# Patient Record
Sex: Male | Born: 1935
Health system: Southern US, Community
[De-identification: ages and names within clinical notes are randomized; demographics above are authoritative.]

## PROBLEM LIST (undated history)

## (undated) DIAGNOSIS — D649 Anemia, unspecified: Secondary | ICD-10-CM

## (undated) DIAGNOSIS — E782 Mixed hyperlipidemia: Secondary | ICD-10-CM

## (undated) DIAGNOSIS — M1990 Unspecified osteoarthritis, unspecified site: Secondary | ICD-10-CM

## (undated) DIAGNOSIS — I209 Angina pectoris, unspecified: Secondary | ICD-10-CM

## (undated) DIAGNOSIS — IMO0002 Reserved for concepts with insufficient information to code with codable children: Secondary | ICD-10-CM

## (undated) DIAGNOSIS — C61 Malignant neoplasm of prostate: Secondary | ICD-10-CM

## (undated) DIAGNOSIS — I509 Heart failure, unspecified: Secondary | ICD-10-CM

## (undated) DIAGNOSIS — Z8711 Personal history of peptic ulcer disease: Secondary | ICD-10-CM

## (undated) DIAGNOSIS — H8309 Labyrinthitis, unspecified ear: Secondary | ICD-10-CM

## (undated) DIAGNOSIS — R0609 Other forms of dyspnea: Secondary | ICD-10-CM

## (undated) DIAGNOSIS — R0600 Dyspnea, unspecified: Secondary | ICD-10-CM

## (undated) DIAGNOSIS — E119 Type 2 diabetes mellitus without complications: Secondary | ICD-10-CM

## (undated) DIAGNOSIS — E1121 Type 2 diabetes mellitus with diabetic nephropathy: Secondary | ICD-10-CM

## (undated) DIAGNOSIS — K5730 Diverticulosis of large intestine without perforation or abscess without bleeding: Secondary | ICD-10-CM

## (undated) DIAGNOSIS — I10 Essential (primary) hypertension: Secondary | ICD-10-CM

## (undated) DIAGNOSIS — E7800 Pure hypercholesterolemia, unspecified: Secondary | ICD-10-CM

## (undated) DIAGNOSIS — I5030 Unspecified diastolic (congestive) heart failure: Secondary | ICD-10-CM

## (undated) DIAGNOSIS — Z87442 Personal history of urinary calculi: Secondary | ICD-10-CM

## (undated) DIAGNOSIS — I2510 Atherosclerotic heart disease of native coronary artery without angina pectoris: Secondary | ICD-10-CM

## (undated) DIAGNOSIS — J40 Bronchitis, not specified as acute or chronic: Secondary | ICD-10-CM

## (undated) DIAGNOSIS — N200 Calculus of kidney: Secondary | ICD-10-CM

## (undated) DIAGNOSIS — K219 Gastro-esophageal reflux disease without esophagitis: Secondary | ICD-10-CM

## (undated) DIAGNOSIS — R002 Palpitations: Secondary | ICD-10-CM

## (undated) DIAGNOSIS — G5710 Meralgia paresthetica, unspecified lower limb: Secondary | ICD-10-CM

## (undated) DIAGNOSIS — M1710 Unilateral primary osteoarthritis, unspecified knee: Secondary | ICD-10-CM

## (undated) DIAGNOSIS — R600 Localized edema: Secondary | ICD-10-CM

## (undated) HISTORY — DX: Diverticulosis of large intestine without perforation or abscess without bleeding: K57.30

## (undated) HISTORY — PX: DIAGNOSTIC LAPAROSCOPY: SUR761

## (undated) HISTORY — DX: Type 2 diabetes mellitus with diabetic nephropathy: E11.21

## (undated) HISTORY — PX: BACK SURGERY: SHX140

## (undated) HISTORY — DX: Localized edema: R60.0

## (undated) HISTORY — DX: Reserved for concepts with insufficient information to code with codable children: IMO0002

## (undated) HISTORY — PX: CATARACT EXTRACTION W/ INTRAOCULAR LENS IMPLANT & ANTERIOR VITRECTOMY, BILATERAL: SHX1310

## (undated) HISTORY — PX: CARDIAC CATHETERIZATION: SHX172

## (undated) HISTORY — DX: Mixed hyperlipidemia: E78.2

## (undated) HISTORY — PX: EYE SURGERY: SHX253

## (undated) HISTORY — PX: JOINT REPLACEMENT: SHX530

## (undated) HISTORY — DX: Atherosclerotic heart disease of native coronary artery without angina pectoris: I25.10

## (undated) HISTORY — DX: Unilateral primary osteoarthritis, unspecified knee: M17.10

## (undated) HISTORY — PX: TOTAL SHOULDER REPLACEMENT: SUR1217

## (undated) HISTORY — PX: LASIK: SHX215

## (undated) HISTORY — DX: Unspecified diastolic (congestive) heart failure: I50.30

## (undated) HISTORY — DX: Labyrinthitis, unspecified ear: H83.09

## (undated) HISTORY — PX: OTHER SURGICAL HISTORY: SHX169

## (undated) HISTORY — PX: COLONOSCOPY: SHX174

## (undated) HISTORY — DX: Personal history of peptic ulcer disease: Z87.11

## (undated) HISTORY — DX: Palpitations: R00.2

## (undated) HISTORY — DX: Meralgia paresthetica, unspecified lower limb: G57.10

---

## 2000-09-26 ENCOUNTER — Encounter: Admission: RE | Admit: 2000-09-26 | Discharge: 2000-09-26 | Payer: Self-pay | Admitting: Gastroenterology

## 2000-09-26 ENCOUNTER — Encounter: Payer: Self-pay | Admitting: Gastroenterology

## 2003-10-15 ENCOUNTER — Encounter: Admission: RE | Admit: 2003-10-15 | Discharge: 2003-10-15 | Payer: Self-pay | Admitting: Internal Medicine

## 2005-08-06 ENCOUNTER — Encounter: Admission: RE | Admit: 2005-08-06 | Discharge: 2005-08-06 | Payer: Self-pay | Admitting: Gastroenterology

## 2006-07-17 ENCOUNTER — Encounter: Admission: RE | Admit: 2006-07-17 | Discharge: 2006-07-17 | Payer: Self-pay | Admitting: Gastroenterology

## 2007-01-06 ENCOUNTER — Ambulatory Visit (HOSPITAL_COMMUNITY): Admission: RE | Admit: 2007-01-06 | Discharge: 2007-01-06 | Payer: Self-pay | Admitting: Urology

## 2007-01-08 ENCOUNTER — Ambulatory Visit: Admission: RE | Admit: 2007-01-08 | Discharge: 2007-02-11 | Payer: Self-pay | Admitting: Radiation Oncology

## 2007-03-12 ENCOUNTER — Encounter (INDEPENDENT_AMBULATORY_CARE_PROVIDER_SITE_OTHER): Payer: Self-pay | Admitting: Urology

## 2007-03-12 ENCOUNTER — Inpatient Hospital Stay (HOSPITAL_COMMUNITY): Admission: RE | Admit: 2007-03-12 | Discharge: 2007-03-13 | Payer: Self-pay | Admitting: Urology

## 2007-07-10 HISTORY — PX: PROSTATECTOMY: SHX69

## 2007-07-12 ENCOUNTER — Emergency Department (HOSPITAL_COMMUNITY): Admission: EM | Admit: 2007-07-12 | Discharge: 2007-07-12 | Payer: Self-pay | Admitting: Emergency Medicine

## 2009-02-06 HISTORY — PX: CORONARY ANGIOPLASTY WITH STENT PLACEMENT: SHX49

## 2009-02-08 ENCOUNTER — Ambulatory Visit (HOSPITAL_COMMUNITY): Admission: RE | Admit: 2009-02-08 | Discharge: 2009-02-08 | Payer: Self-pay | Admitting: Interventional Cardiology

## 2009-02-18 ENCOUNTER — Inpatient Hospital Stay (HOSPITAL_BASED_OUTPATIENT_CLINIC_OR_DEPARTMENT_OTHER): Admission: RE | Admit: 2009-02-18 | Discharge: 2009-02-18 | Payer: Self-pay | Admitting: Interventional Cardiology

## 2009-02-24 ENCOUNTER — Inpatient Hospital Stay (HOSPITAL_COMMUNITY): Admission: AD | Admit: 2009-02-24 | Discharge: 2009-02-25 | Payer: Self-pay | Admitting: Interventional Cardiology

## 2010-10-14 LAB — CBC
Hemoglobin: 14.4 g/dL (ref 13.0–17.0)
RBC: 4.4 MIL/uL (ref 4.22–5.81)

## 2010-10-14 LAB — BASIC METABOLIC PANEL
CO2: 28 mEq/L (ref 19–32)
Glucose, Bld: 127 mg/dL — ABNORMAL HIGH (ref 70–99)
Potassium: 4.1 mEq/L (ref 3.5–5.1)
Sodium: 140 mEq/L (ref 135–145)

## 2010-10-15 LAB — POCT I-STAT 3, VENOUS BLOOD GAS (G3P V)
Acid-base deficit: 1 mmol/L (ref 0.0–2.0)
Bicarbonate: 25.3 mEq/L — ABNORMAL HIGH (ref 20.0–24.0)
O2 Saturation: 67 %
TCO2: 27 mmol/L (ref 0–100)
pO2, Ven: 38 mmHg (ref 30.0–45.0)

## 2010-10-15 LAB — POCT I-STAT 3, ART BLOOD GAS (G3+)
O2 Saturation: 95 %
TCO2: 29 mmol/L (ref 0–100)

## 2010-11-21 NOTE — H&P (Signed)
Ryan Hunt, Ryan Hunt               ACCOUNT NO.:  [id number]   MEDICAL RECORD NO.:  [id number]          PATIENT TYPE:  INP   LOCATION:  2                         FACILITY:  [facility]   PHYSICIAN:  Ronald L. Rosana Hoes, M.D.  DATE OF BIRTH:  1931/06/27   DATE OF ADMISSION:  03/12/2007  DATE OF DISCHARGE:                              HISTORY & PHYSICAL   CHIEF COMPLAINT:  I have prostate cancer.   HISTORY OF PRESENT ILLNESS:  Ryan Hunt is a very nice 77 year old  white male with a strong family history of prostate cancer.  He was sent  for evaluation by Dr. Mindi Curling and found to have an elevated PSA of  7.8.  Subsequently, underwent ultrasound biopsy of prostate which  revealed a Gleason score 7 adenocarcinoma in 50% of the biopsy in the  base of the right side of his prostate, 10% from the mid section of the  right side of the prostate.  Metastatic workup was essentially negative.  After understanding risks, benefits, alternatives, elected to proceed  with robotic prostatectomy.   PAST MEDICAL HISTORY:  He has no known allergies.   MEDICATIONS:  He is currently on Flomax which he will stop, Benazepril,  hydrochlorothiazide, omeprazole, Lipitor, enteramine, aspirin which he  stopped, multivitamins and flax oil.   MEDICAL ILLNESSES:  He has hypertension and hypercholesterolemia.  He  does have some voiding symptoms, but otherwise, he is without particular  problems.   SURGERY:  He has had right shoulder surgery, left shoulder surgery and  left knee surgery.   SOCIAL HISTORY:  He smoked half a pack a day for 20 years, but he quit  35 years ago.  He has two glasses of beer per week.   FAMILY HISTORY:  His father has suffered prostate cancer, had prostate  removed.   REVIEW OF SYSTEMS:  He has no shortness of breath, dyspnea on exertion,  chest pain or GI complaints.   PHYSICAL EXAMINATION:  VITAL SIGNS:  Afebrile.  Vital Signs: Stable.  GENERAL:  Well-nourished, well-groomed,  oriented x3.  HEENT:  Nose and throat normal.  NECK:  Without masses or thyromegaly.  CHEST:  Normal diaphragmatic motion.  ABDOMEN:  Soft, nontender without masses, organomegaly or hernias.  EXTREMITIES: Normal.  NEUROLOGICAL:  Intact.  SKIN:  Normal.  GENITALIA:  penis, meatus, scrotum, testicle, adnexa, anus, perineum  normal.  Rectal vault is empty.  Prostate is 35 grams, benign to  palpation.  HEART:  Normal sinus rhythm without murmurs or gallops.   IMPRESSION:  Clinical stage T1C adenocarcinoma of prostate.   PLANS:  Radical retropubic prostatectomy.      [place] Rosana Hoes, M.D.  Electronically Signed     RLD/MEDQ  D:  03/12/2007  T:  03/13/2007  Job:  [id number]

## 2010-11-21 NOTE — Discharge Summary (Signed)
Ryan Hunt, Ryan Hunt NO.:  [id number]   MEDICAL RECORD NO.:  [id number]          PATIENT TYPE:  INP   LOCATION:  2506                         FACILITY:  [facility]   PHYSICIAN:  Belva Crome, M.D.   DATE OF BIRTH:  1930/07/18   DATE OF ADMISSION:  02/24/2009  DATE OF DISCHARGE:  02/25/2009                               DISCHARGE SUMMARY   ADMITTING DIAGNOSIS:  Exertional angina, class III with 90% mid  circumflex stenosis documented by diagnostic cath.   DISCHARGE DIAGNOSES:  1. Coronary artery disease with angina.      a.     Successful drug-eluting stent implantation into the mid       circumflex on February 24, 2009.  2. Hypertension.  3. Hyperlipidemia.  4. Dyspnea on exertion of uncertain etiology.      a.     Abnormal CT scan of the chest demonstrating 4 mm bilateral       visual nodules of uncertain significance.   PROCEDURES PERFORMED:  Percutaneous coronary intervention with drug-  eluting stent XIENCE stent on February 24, 2009.   CONDITION ON DISCHARGE:  Improved.   MEDICATIONS ON DISCHARGE:  1. Lotensin HCT 20/25 mg daily.  2. Coated aspirin 325 mg daily.  3. Flomax 0.4 mg daily.  4. Plavix 75 mg daily.  5. Lipitor 10 mg daily.  6. Nitroglycerin 0.4 mg sublingually p.r.n. chest pain.  7. Omeprazole 20 mg per day.  8. Omega-3 fish oil 1 g twice a day.  9. Multiple vitamin 1 a day.  10.Flaxseed oil, dose unknown.   ACTIVITY:  Gradually increase activity back to usual activity on February 28, 2009.  Return to work on March 09, 2009.  He is cautioned to call  if prolonged chest discomfort.  He is urged to use sublingual  nitroglycerin if chest discomfort greater than 5 minutes.   DIET:  Prudent, low-fat diet.   HISTORY AND PHYSICAL AND CLINICAL COURSE:  The patient was admitted to  the hospital on February 24, 2009, at the same day admission for PCI of  circumflex.  Please refer to the procedure note for details.  He  underwent successful  stenting of the mid circumflex lesion from 90% to  0% with TIMI grade 3 flow.   No complications occurred in recovery for overnight.  He ambulated  without difficulty.  He is discharged home on his same medical regimen.  He has follow up set to see Dr. Daneen Schick on March 02, 2009.      Belva Crome, M.D.  Electronically Signed     HWS/MEDQ  D:  02/25/2009  T:  02/25/2009  Job:  [id number]

## 2010-11-21 NOTE — Cardiovascular Report (Signed)
NAMEBRETLEY, Ryan Hunt NO.:  [id number]   MEDICAL RECORD NO.:  [id number]          PATIENT TYPE:  OIB   LOCATION:  1963                         FACILITY:  [facility]   PHYSICIAN:  Belva Crome, M.D.   DATE OF BIRTH:  August 27, 1930   DATE OF PROCEDURE:  02/18/2009  DATE OF DISCHARGE:  02/18/2009                            CARDIAC CATHETERIZATION   INDICATIONS FOR PROCEDURE:  Exertional dyspnea, unexplainable.  Prior  catheterization in 1994 demonstrating 70% LAD.  Recent nuclear study  unremarkable for ischemia but with exercise-induced left bundle-branch  block.  The patient has had a CT angiogram performed but has no evidence  of pulmonary emboli.  This study is being done to rule out the  possibility of pulmonary hypertension unidentified by prior testing and  to rule out matched three-vessel ischemia causing the perfusion scan to  be normal.   PROCEDURE PERFORMED:  1. Right heart cath.  2. Left heart cath.  3. Coronary angiography.  4. Left ventriculography by hand injection.   DESCRIPTION:  After informed consent, a 4-French sheath was started in  the right femoral artery and a 7-French sheath in the right femoral  vein, both using the modified Seldinger technique.  Xylocaine 1% was  used for local anesthesia.  Versed 1 mg and 25 mcg of fentanyl was  administered for sedation.   The patient then had a Swan-Ganz catheter advanced through each right  heart chamber and into the pulmonary capillary wedge position.  There  was no evidence of pulmonary hypertension, and the wedge pressure was  relatively normal.  Cardiac outputs were performed using both  thermodilution technique and the Fick method.  Fick cardiac output was  4.L liters per minute.  Thermodilution cardiac output 3.4 L per minute.   Coronary angiography was then performed following left ventriculography  by hand injection using the 4-French multipurpose catheter.  The right  coronary was selectively  engaged and 2 images were obtained.  A 4-French  #4 left Judkins catheter was used for left coronary angiography.  Nitroglycerin 200 mcg was administered into the left coronary via the  Judkins catheter.  The patient tolerated the procedure without  complications.   RESULTS:  1. Hemodynamic data:      a.     Aortic pressure 119/60.      b.     Left ventricular pressure 123/20.      c.     Right atrial mean pressure 7 mmHg.      d.     Right ventricular pressure 33/49 mmHg.      e.     Pulmonary artery pressure 26/70.      f.     Capillary wedge mean pressure 15 mmHg, A-wave 19 mmHg, V-       wave 17 mmHg.      g.     Cardiac output by thermodilution 3.4 L per minute.  Cardiac       output by Fick 4.5 L per minute.  2. Left ventriculography:  The LV cavity size and function are normal.  No mitral regurgitation is noted.  Estimated EF is 65%.  3. Coronary angiography:      a.     Left main:  Widely patent.      b.     Left anterior descending:  Proximal mild-to-moderate       calcification is noted.  The first diagonal pains 85-90% ostial       narrowing.  The diagonal is small to moderate in size.  The second       diagonal is large.  The LAD between the first and second diagonal       contains mild less than 30% narrowing.  The LAD wraps around the       left ventricular apex.      c.     Circumflex artery:  Circumflex coronary artery gives origin       to 3 obtuse marginal branches.  The second obtuse marginal is       large vessel.  The first obtuse marginal is relatively small and       contains ostial 60% narrowing.  The circumflex in the mid segment       near the origin of the first marginal contains a very eccentric       stenosis that is 85% in many of the LAO views and appears to be       less than 50% in RAO views.  The RAO views were misleading because       there is a branch that overlaps the stenosis and gives the       impression that there is a larger lumen that  is actually present.       Distal circumflex is patent and does not have any significant       obstruction.      d.     Right coronary:  This is a dominant vessel.  PDA is noted.       Left ventricular branches are noted.  No significant obstructive       lesions are seen.   CONCLUSION:  1. Significant mid to proximal circumflex.  There is greater than 75%      obstruction noted.  The first diagonal also contains 85%      obstruction.  Left anterior descending contains luminal      irregularities.  2. Normal left ventricular function.  3. Normal pulmonary artery pressures.  4. Upper normal to mildly elevated end-diastolic left ventricular      pressure is raising the possibility of mild diastolic heart      failure.   RECOMMENDATIONS:  Consider PCI on the circumflex lesion which  theoretically could cause disproportionate dyspnea because of papillary  muscle ischemia and secondary mitral regurgitation.  The patient's  dyspnea, however, could be due to diastolic heart failure and physical  decondition.  Pulmonary emboli and significant pulmonary hypertension  has been excluded.      Belva Crome, M.D.  Electronically Signed     HWS/MEDQ  D:  02/18/2009  T:  02/18/2009  Job:  [id number]   cc:   Cletis Athens, M.D.

## 2010-11-21 NOTE — Cardiovascular Report (Signed)
Ryan Hunt, Ryan Hunt NO.:  [id number]   MEDICAL RECORD NO.:  [id number]          PATIENT TYPE:  INP   LOCATION:  2506                         FACILITY:  [facility]   PHYSICIAN:  Belva Crome, M.D.   DATE OF BIRTH:  11-14-1930   DATE OF PROCEDURE:  02/24/2009  DATE OF DISCHARGE:                            CARDIAC CATHETERIZATION   INDICATIONS FOR PROCEDURE:  Class II-III angina with documented 90%  eccentric mid circumflex lesion.   PROCEDURE PERFORMED:  Circumflex drug-eluting stent deployment, Xience V  3.0 x 12 mm.   DESCRIPTION:  The patient was brought to the Cath Lab.  He had been  pretreated with Plavix.  He received 300 of Plavix on the morning of the  procedure.  He was given a bolus of Angiomax 0.75 mg/kg followed by 1.75  mg/kg per hour infusion.  ACT was documented to be greater than 450.  Pre-dilatation was performed with a 2.5 x 12 mm long Voyager balloon.  A  3.0 x 12 mm long Xience V drug-eluting stent was then deployed and  postdilated with an 8-mm long x 3.0-mm diameter Voyager [place] to 14  atmospheres.  Good angiographic result was obtained.  The region just  distal to the stent contains residual 30-40% narrowing, but no  significant change or high-grade obstruction was felt to be present.  TIMI grade 3 flow was noted.   The equipment used other than as stated above with balloons and stents  includes a CLS 3.5 6-French guide catheter, and a BMW guidewire.  The  patient received 2 mg of Versed and 50 mcg of fentanyl intravenously.  A  700 mcg of intracoronary nitroglycerin was administered during the  procedure.   CONCLUSION:  Successful stenting of the mid circumflex from 90% to 0%  with TIMI grade 3 flow.   PLAN:  Clinical followup.  Aspirin and Plavix for at least a year.  Discharge on February 25, 2009.      Belva Crome, M.D.  Electronically Signed     HWS/MEDQ  D:  02/24/2009  T:  02/24/2009  Job:  [id number]   cc:   Cletis Athens,  M.D.

## 2010-11-21 NOTE — Op Note (Signed)
Ryan Hunt, Ryan Hunt               ACCOUNT NO.:  [id number]   MEDICAL RECORD NO.:  [id number]          PATIENT TYPE:  INP   LOCATION:  80                         FACILITY:  [facility]   PHYSICIAN:  Ronald L. Rosana Hoes, M.D.  DATE OF BIRTH:  21-Aug-1930   DATE OF PROCEDURE:  03/12/2007  DATE OF DISCHARGE:                               OPERATIVE REPORT   DIAGNOSIS:  Adenocarcinoma of the prostate.   OPERATIVE PROCEDURE:  Robotic radical prostatectomy and bilateral pelvic  lymphadenectomy.   SURGEON:  Duane Lope. Rosana Hoes, M.D.   ASSISTANT:  Raynelle Bring, M.D.   ANESTHESIA:  General endotracheal.   ESTIMATED BLOOD LOSS:  150 mL.   TUBES:  20-French Coude Foley catheter and a large round Blake drain.   COMPLICATIONS:  None.   INDICATIONS FOR PROCEDURE:  Ryan Hunt is a very nice 77 year old white  male with a strong family history of prostate cancer who presented with  an elevated PSA of 7.8.  He subsequently underwent ultrasound and biopsy  of the prostate which revealed a Gleason score 7 which was 3+4  adenocarcinoma with 50% of the tissue from the right base of the  prostate and 10% from the right mid prostate.  His metastatic workup was  essentially negative for metastatic disease and after understanding  risks, benefits and alternatives, he elected to proceed with robotic  radical prostatectomy.   PROCEDURE IN DETAIL:  The patient was placed in a supine position and  after appropriate general endotracheal anesthesia, was placed in the  exaggerated lithotomy position, prepped and draped with Betadine in a  sterile fashion.  A 24-French 30 mL balloon catheter was placed in the  bladder and the bladder was drained clear.  A periumbilical port was  placed under direct vision with a 12 mm cannula for the camera site and  after the abdomen had been examined and there were no significant  abnormalities noted, ports were put in under direct vision, a 12 mm  right lateral port, two 8 mm  robotic ports right and left lateral ports,  third arm port left lateral to the left working arm port, and a 5 mm  right superior port.  The proper connections were made to the robot and  dissection was begun.  The bladder was insufflated with approximately  200 mL of sterile saline and the anterior bladder flap was taken down.  The space of Retzius was entered.  The endopelvic fascia was perforated  bilaterally.  Puboprostatic ligaments were partially excised. The tissue  was taken down to the dorsal vein complex.  The dorsal vein complex was  then clamped and cut with an Endo-GIA stapler mobilizing the prostate.  The bladder neck was then dissected off of the prostate anteriorly and  posteriorly down to the area of the seminal vesicles and the ampulla vas  deferens.  These were taken down.  The ampulla vas deferens were sharply  cauterized and cut and the seminal vesicles were taken down bilaterally.  The patient already has erectile dysfunction and it was felt that an  intense nerve spare was not totally indicated  and this had been  discussed with him preoperatively. The right and left bundles were taken  in packets and clipped with Hem-A-Lock clips and dissection was carried  down to the distal prostate after Denonvilliers' fascia had been  dissected off of the posterior prostate.  The apex was then approached  and was sharply dissected down to the urethra.  The urethra was incised  anteriorly and posteriorly and the prostate was disarticulated and  placed in the right lower quadrant.  Bilateral pelvic lymphadenectomy  was then performed under direct vision. Both the external iliac  obturator and hypogastric lymph nodes were taken down. The obturator  nerve was identified and protected.  The lateral lymph nodes were  avoided and each of these specimens were clipped anteriorly and  posteriorly and thus submitted as right and left side and they were  removed through the 12 mm working  port.  Reinspection revealed good  hemostasis and the anastomosis was then approached.  An amp of indigo  carmine was given IV and the orifices were well back.  There was a good  bladder neck. Tissue was sewn with a 2-0 Vicryl suture on the margin on  Denonvilliers' fascia to the periurethral tissue posteriorly and a  holding stitch was placed with a 2-0 Vicryl suture in the 6 o'clock  position to the bladder neck and to the urethral stump.  A running  anastomosis was then performed with 3-0 Monocryl suture utilizing dyed  and undyed suture tied together posteriorly and these were tied  anteriorly. A 20 French Coude catheter was easily passed in the bladder  and the bladder was noted to irrigate clear.  Under direct vision, the  prostatic specimen was grasped with an EndoCatch bag and the right 12 mm  port was closed under direct vision with a 2-0 Vicryl on a suture  passer.  Each of the ports were visualized and removed.  There was good  hemostasis present after a large round Blake drain had been placed in  the pelvis and the specimen was removed through the periumbilical  incision and the fascia was closed with a running 2-0 Vicryl suture.  Good hemostasis was noted to be present.  Each incision was injected  with 0.25% Marcaine and closed with skin staples.  The wounds were  dressed sterilely.  The patient tolerated the procedure well and the  specimen was submitted to pathology and he was taken to the recovery  room stable.      [place] Rosana Hoes, M.D.  Electronically Signed     RLD/MEDQ  D:  03/12/2007  T:  03/12/2007  Job:  [id number]

## 2011-03-29 LAB — URINALYSIS, ROUTINE W REFLEX MICROSCOPIC
Glucose, UA: NEGATIVE
Ketones, ur: 15 — AB
Leukocytes, UA: NEGATIVE
Protein, ur: 100 — AB

## 2011-04-20 LAB — URINALYSIS, ROUTINE W REFLEX MICROSCOPIC
Bilirubin Urine: NEGATIVE
Glucose, UA: NEGATIVE
Hgb urine dipstick: NEGATIVE
Ketones, ur: NEGATIVE
Protein, ur: NEGATIVE

## 2011-04-20 LAB — COMPREHENSIVE METABOLIC PANEL
Albumin: 3.8
BUN: 15
Creatinine, Ser: 1.06
Glucose, Bld: 99
Total Bilirubin: 0.8
Total Protein: 7

## 2011-04-20 LAB — CBC
HCT: 40.1
HCT: 44.6
MCV: 92.1
MCV: 91.4
Platelets: 125 — ABNORMAL LOW
Platelets: 165
RBC: 4.35
RDW: 14.1 — ABNORMAL HIGH
WBC: 8.8

## 2011-04-20 LAB — BASIC METABOLIC PANEL
BUN: 8
Chloride: 103
Potassium: 4

## 2011-04-20 LAB — DIFFERENTIAL
Eosinophils Absolute: 0
Eosinophils Relative: 0
Lymphs Abs: 0.8
Monocytes Absolute: 0.8 — ABNORMAL HIGH
Monocytes Relative: 9

## 2011-04-20 LAB — PROTIME-INR
INR: 1.1
Prothrombin Time: 14.2

## 2011-04-20 LAB — HEMOGLOBIN AND HEMATOCRIT, BLOOD: HCT: 39.7

## 2011-04-20 LAB — APTT: aPTT: 28

## 2011-11-07 DIAGNOSIS — J40 Bronchitis, not specified as acute or chronic: Secondary | ICD-10-CM

## 2011-11-07 HISTORY — DX: Bronchitis, not specified as acute or chronic: J40

## 2011-11-12 ENCOUNTER — Encounter (HOSPITAL_COMMUNITY): Payer: Self-pay | Admitting: Respiratory Therapy

## 2011-11-20 ENCOUNTER — Inpatient Hospital Stay (HOSPITAL_COMMUNITY): Admission: RE | Admit: 2011-11-20 | Payer: Self-pay | Source: Ambulatory Visit

## 2011-12-17 ENCOUNTER — Other Ambulatory Visit: Payer: Self-pay | Admitting: Physician Assistant

## 2011-12-17 ENCOUNTER — Inpatient Hospital Stay (HOSPITAL_COMMUNITY): Admission: RE | Admit: 2011-12-17 | Payer: Medicare Other | Source: Ambulatory Visit

## 2011-12-17 ENCOUNTER — Encounter (HOSPITAL_COMMUNITY): Payer: Self-pay

## 2011-12-17 ENCOUNTER — Encounter: Payer: Self-pay | Admitting: Physician Assistant

## 2011-12-17 DIAGNOSIS — E7800 Pure hypercholesterolemia, unspecified: Secondary | ICD-10-CM | POA: Insufficient documentation

## 2011-12-17 DIAGNOSIS — I10 Essential (primary) hypertension: Secondary | ICD-10-CM | POA: Insufficient documentation

## 2011-12-17 DIAGNOSIS — R0602 Shortness of breath: Secondary | ICD-10-CM

## 2011-12-17 DIAGNOSIS — M1990 Unspecified osteoarthritis, unspecified site: Secondary | ICD-10-CM | POA: Insufficient documentation

## 2011-12-17 DIAGNOSIS — E1169 Type 2 diabetes mellitus with other specified complication: Secondary | ICD-10-CM | POA: Insufficient documentation

## 2011-12-17 DIAGNOSIS — J40 Bronchitis, not specified as acute or chronic: Secondary | ICD-10-CM

## 2011-12-17 DIAGNOSIS — E669 Obesity, unspecified: Secondary | ICD-10-CM | POA: Insufficient documentation

## 2011-12-17 DIAGNOSIS — M1712 Unilateral primary osteoarthritis, left knee: Secondary | ICD-10-CM

## 2011-12-17 NOTE — Pre-Procedure Instructions (Signed)
[place]  12/17/2011   Your procedure is scheduled on:  Monday December 31, 2011.  Report to [facility] at [place] AM.  Call this number if you have problems the morning of surgery: [phone]   Remember:   Do not eat food:After Midnight.  May have clear liquids: up to 4 Hours before arrival until 0520 am.  Clear liquids include soda, tea, black coffee, apple or grape juice, broth.  Take these medicines the morning of surgery with A SIP OF WATER: Amlodipine (Norvasc), and Omeprazole (Prilosec)   Do not wear jewelry  Do not wear lotions or colognes.    Men may shave face and neck.  Do not bring valuables to the hospital.  Contacts, dentures or bridgework may not be worn into surgery.  Leave suitcase in the car. After surgery it may be brought to your room.  For patients admitted to the hospital, checkout time is 11:00 AM the day of discharge.   Patients discharged the day of surgery will not be allowed to drive home.  Name and phone number of your driver:  Special Instructions: CHG Shower Use Special Wash: 1/2 bottle night before surgery and 1/2 bottle morning of surgery.   Please read over the following fact sheets that you were given: Pain Booklet, Coughing and Deep Breathing, Blood Transfusion Information, Total Joint Packet, MRSA Information and Surgical Site Infection Prevention

## 2011-12-17 NOTE — H&P (Signed)
Ryan Hunt is an 78 y.o. male.   Chief Complaint: left knee HPI: Ryan Hunt is a 78 year old seen for follow-up from significant pain in both knees left worse than right. We saw him on 04/24/11 and injected both knees with the third Supartz injection but this helped minimally. The excruciating pain is getting progressively worse, limiting activities of daily living, poses a significant fall risk, and has failed multiple conservative treatments including intraarticular cortisone injections, intraarticular Supartz, bracing, medication and home physical therapy.  Past Medical History  Diagnosis Date  . Hypertension   . Diabetes mellitus   . Bronchitis     hx of  . Shortness of breath     With ambulation  . GERD (gastroesophageal reflux disease)   . Arthritis   . Kidney stone     hx of  . Hypercholesterolemia     Past Surgical History  Procedure Date  . Joint replacement     Bilateral arthoscopic shoulders  . Prostate surgery     Removal  . Cardiac catheterization   . Coronary angioplasty     Stent X 1 in 2010  . Eye surgery     Bilateral lens implant approx 15 years ago  . Colonoscopy     No family history on file. Social History:  reports that he quit smoking about 40 years ago. He does not have any smokeless tobacco history on file. He reports that he drinks alcohol. He reports that he does not use illicit drugs.  Allergies:  Allergies  Allergen Reactions  . Amoxicillin Hives   Current Outpatient Prescriptions on File Prior to Visit  Medication Sig Dispense Refill  . amLODipine (NORVASC) 10 MG tablet Take 10 mg by mouth daily.      Marland Kitchen aspirin 325 MG tablet Take 325 mg by mouth daily.      . benazepril-hydrochlorthiazide (LOTENSIN HCT) 20-25 MG per tablet Take 1 tablet by mouth daily.      Marland Kitchen CINNAMON PO Take 1 tablet by mouth daily.      . fish oil-omega-3 fatty acids 1000 MG capsule Take 2 g by mouth daily.      Marland Kitchen glyBURIDE (DIABETA) 2.5 MG tablet Take 2.5 mg by  mouth 2 (two) times daily with a meal.      . metFORMIN (GLUCOPHAGE) 500 MG tablet Take 1,000 mg by mouth 2 (two) times daily with a meal.      . omeprazole (PRILOSEC) 20 MG capsule Take 20 mg by mouth daily.      . rosuvastatin (CRESTOR) 10 MG tablet Take 10 mg by mouth daily.      . nitroGLYCERIN (NITROSTAT) 0.4 MG SL tablet Place 0.4 mg under the tongue every 5 (five) minutes as needed. For chest pain        (Not in a hospital admission)  No results found for this or any previous visit (from the past 48 hour(s)). No results found.  Review of Systems  Constitutional: Negative.   HENT: Negative.   Eyes: Negative.   Respiratory: Negative.   Cardiovascular: Negative.   Gastrointestinal: Negative.   Genitourinary: Negative.   Musculoskeletal:       Left knee pain  Skin: Negative.   Neurological: Negative.   Endo/Heme/Allergies: Negative.   Psychiatric/Behavioral: Negative.     Blood pressure 162/70, pulse 64, temperature 98.1 F (36.7 C), height 5\' 7"  (1.702 m), weight 102.059 kg (225 lb), SpO2 95.00%. Physical Exam  Constitutional: He is oriented to person, place, and  time. He appears well-developed and well-nourished.  HENT:  Head: Normocephalic and atraumatic.  Mouth/Throat: Oropharynx is clear and moist.  Eyes: EOM are normal. Pupils are equal, round, and reactive to light.  Neck: Normal range of motion. Neck supple.  Cardiovascular: Normal rate, regular rhythm and normal heart sounds.   Respiratory: Effort normal and breath sounds normal.  GI: Soft. Bowel sounds are normal.  Genitourinary:       Not pertinent to current symptomatology therefore not examined.  Musculoskeletal:       Examination of both knees reveals 1 to 2+ crepitation 1+ synovitis range of motion 0-120 degrees both knees are stable with mild diffuse pain mild varus deformity left worse than right. Vascular exam: pulses 2+ and symmetric.  Neurological: He is alert and oriented to person, place, and time.   Skin: Skin is warm and dry.  Psychiatric: He has a normal mood and affect. His behavior is normal. Judgment and thought content normal.    Xrays: AP,LATERAL, FLEXION, and SUNRISE views show significant joint space narrowing, with periarticular osteophytes, and subchondral sclerosis.  Assessment Patient Active Problem List  Diagnoses  . Hypertension  . Bronchitis  . Shortness of breath  . Arthritis  . Hypercholesterolemia  . Left knee DJD  . Diabetes mellitus type 2 in obese   Plan I talk to him about this in detail. I recommend with his significant pain lack of response to conservative care that we schedule a left total knee replacement. Discussed risks benefits and possible complications of the surgery in detail and he understands this completely.   Jhana Giarratano J 12/17/2011, 4:19 PM

## 2011-12-17 NOTE — Progress Notes (Signed)
Pt informed Nurse that a stress test was performed at [facility] in January 2013. Will request results. Pt also reported having a cardiac cath in 2010 with one stent placed, results on chart. Pt denied having a sleep study.

## 2011-12-24 ENCOUNTER — Encounter (HOSPITAL_COMMUNITY)
Admission: RE | Admit: 2011-12-24 | Discharge: 2011-12-24 | Disposition: A | Discharge status: Home / Self Care | Payer: Medicare Other | Source: Ambulatory Visit | Attending: Orthopedic Surgery | Admitting: Orthopedic Surgery

## 2011-12-24 ENCOUNTER — Encounter (HOSPITAL_COMMUNITY): Payer: Self-pay

## 2011-12-24 ENCOUNTER — Ambulatory Visit (HOSPITAL_COMMUNITY)
Admission: RE | Admit: 2011-12-24 | Discharge: 2011-12-24 | Disposition: A | Discharge status: Home / Self Care | Payer: Medicare Other | Source: Ambulatory Visit | Attending: Physician Assistant | Admitting: Physician Assistant

## 2011-12-24 DIAGNOSIS — E119 Type 2 diabetes mellitus without complications: Secondary | ICD-10-CM | POA: Insufficient documentation

## 2011-12-24 DIAGNOSIS — I10 Essential (primary) hypertension: Secondary | ICD-10-CM | POA: Insufficient documentation

## 2011-12-24 DIAGNOSIS — I2510 Atherosclerotic heart disease of native coronary artery without angina pectoris: Secondary | ICD-10-CM | POA: Insufficient documentation

## 2011-12-24 DIAGNOSIS — Z01812 Encounter for preprocedural laboratory examination: Secondary | ICD-10-CM | POA: Insufficient documentation

## 2011-12-24 DIAGNOSIS — Z01818 Encounter for other preprocedural examination: Secondary | ICD-10-CM | POA: Insufficient documentation

## 2011-12-24 HISTORY — DX: Gastro-esophageal reflux disease without esophagitis: K21.9

## 2011-12-24 HISTORY — DX: Bronchitis, not specified as acute or chronic: J40

## 2011-12-24 HISTORY — DX: Pure hypercholesterolemia, unspecified: E78.00

## 2011-12-24 HISTORY — DX: Essential (primary) hypertension: I10

## 2011-12-24 HISTORY — DX: Calculus of kidney: N20.0

## 2011-12-24 HISTORY — DX: Unspecified osteoarthritis, unspecified site: M19.90

## 2011-12-24 LAB — TYPE AND SCREEN
ABO/RH(D): A POS
Antibody Screen: NEGATIVE

## 2011-12-24 LAB — COMPREHENSIVE METABOLIC PANEL
ALT: 58 U/L — ABNORMAL HIGH (ref 0–53)
Calcium: 10.2 mg/dL (ref 8.4–10.5)
GFR calc Af Amer: >90 mL/min (ref >90)
Glucose, Bld: 106 mg/dL — ABNORMAL HIGH (ref 70–99)
Sodium: 142 mEq/L (ref 135–145)
Total Protein: 7.4 g/dL (ref 6.0–8.3)

## 2011-12-24 LAB — URINALYSIS, ROUTINE W REFLEX MICROSCOPIC
Bilirubin Urine: NEGATIVE
Ketones, ur: NEGATIVE mg/dL
Nitrite: NEGATIVE
Protein, ur: 30 mg/dL — AB
Urobilinogen, UA: 1 mg/dL (ref 0.0–1.0)

## 2011-12-24 LAB — DIFFERENTIAL
Eosinophils Relative: 1 % (ref 0–5)
Lymphocytes Relative: 27 % (ref 12–46)
Lymphs Abs: 1.7 10*3/uL (ref 0.7–4.0)
Neutro Abs: 3.9 10*3/uL (ref 1.7–7.7)
Neutrophils Relative %: 62 % (ref 43–77)

## 2011-12-24 LAB — PROTIME-INR
INR: 1.16 (ref 0.00–1.49)
Prothrombin Time: 15 seconds (ref 11.6–15.2)

## 2011-12-24 LAB — URINE MICROSCOPIC-ADD ON

## 2011-12-24 LAB — CBC
Hemoglobin: 15.6 g/dL (ref 13.0–17.0)
MCH: 31.9 pg (ref 26.0–34.0)
MCHC: 35.5 g/dL (ref 30.0–36.0)

## 2011-12-24 MED ORDER — CHLORHEXIDINE GLUCONATE 4 % EX LIQD: 60.0000 mL | Freq: Once | CUTANEOUS | Status: DC

## 2011-12-25 NOTE — Consult Note (Signed)
Anesthesia Chart Review:  Patient is a 78 year old male scheduled for left TKR on 12/31/11.  History includes HTN, HLD, DM2, DOE, GERD, kidney stones, bilateral shoulder surgeries, CAD s/p DES CX 02/24/09, rostatectomy, remote history of smoking.  Records indicate he had an exercise induced left BBB on stress test prior to his cath in August 2010.  His Cardiologist is Dr. Tamala Julian [facility]).  He cleared him for this procedure on 08/02/11 for "upcoming" TKR.  EKG then showed NSR.    His last stress test was on 12/07/09 and showed normal myocardial perfusion, EF 72%.  His last cardiac cath was on 02/18/09 and showed: 1. Significant mid to proximal circumflex. There is greater than 75% obstruction noted. The first diagonal also contains 85% obstruction. Left anterior descending contains luminal  irregularities.  2. Normal left ventricular function.  3. Normal pulmonary artery pressures.  4. Upper normal to mildly elevated end-diastolic left ventricular pressure is raising the possibility of mild diastolic heart failure. Ultimately, he had A DES placed to CX on 02/24/09.  There were no acute cardiopulmonary abnormalities on CXR from 12/24/11.  Labs noted.  AST 50, ALT 58.  PLT 115. Coags WNL.  There are no comparison LFTs currently available.  He does have mild thrombocytopenia but normal PT/PTT.  Will not plan to order any additional labs pre-operatively.  Myra Gianotti, PA-C

## 2011-12-28 NOTE — Progress Notes (Signed)
Patient notified of surgery time change. To be at short stay at 0830am. Reminded to be NPO after midnight. Ryan Hunt

## 2011-12-30 MED ORDER — VANCOMYCIN HCL 1000 MG IV SOLR
1500.0000 mg | INTRAVENOUS | Status: AC
  Administered 2011-12-31: 1500 mg via INTRAVENOUS
  Filled 2011-12-30 (×2): qty 1500

## 2011-12-30 MED ORDER — POVIDONE-IODINE 7.5 % EX SOLN
Freq: Once | CUTANEOUS | Status: DC
  Filled 2011-12-30: qty 118

## 2011-12-30 MED ORDER — LACTATED RINGERS IV SOLN: INTRAVENOUS | Status: DC

## 2011-12-31 ENCOUNTER — Encounter (HOSPITAL_COMMUNITY): Payer: Self-pay | Admitting: Vascular Surgery

## 2011-12-31 ENCOUNTER — Inpatient Hospital Stay (HOSPITAL_COMMUNITY)
Admission: RE | Admit: 2011-12-31 | Discharge: 2012-01-02 | DRG: 470 | Disposition: A | Discharge status: Home Health | Payer: Medicare Other | Source: Ambulatory Visit | Attending: Orthopedic Surgery | Admitting: Orthopedic Surgery

## 2011-12-31 ENCOUNTER — Encounter (HOSPITAL_COMMUNITY)
Admission: RE | Disposition: A | Discharge status: Home Health | Payer: Self-pay | Source: Ambulatory Visit | Attending: Orthopedic Surgery

## 2011-12-31 ENCOUNTER — Ambulatory Visit (HOSPITAL_COMMUNITY): Payer: Medicare Other | Admitting: Vascular Surgery

## 2011-12-31 DIAGNOSIS — M1990 Unspecified osteoarthritis, unspecified site: Secondary | ICD-10-CM | POA: Diagnosis present

## 2011-12-31 DIAGNOSIS — K219 Gastro-esophageal reflux disease without esophagitis: Secondary | ICD-10-CM | POA: Diagnosis present

## 2011-12-31 DIAGNOSIS — Z961 Presence of intraocular lens: Secondary | ICD-10-CM

## 2011-12-31 DIAGNOSIS — M1710 Unilateral primary osteoarthritis, unspecified knee: Principal | ICD-10-CM | POA: Diagnosis present

## 2011-12-31 DIAGNOSIS — E669 Obesity, unspecified: Secondary | ICD-10-CM | POA: Diagnosis present

## 2011-12-31 DIAGNOSIS — R0609 Other forms of dyspnea: Secondary | ICD-10-CM | POA: Diagnosis present

## 2011-12-31 DIAGNOSIS — M1712 Unilateral primary osteoarthritis, left knee: Secondary | ICD-10-CM

## 2011-12-31 DIAGNOSIS — D62 Acute posthemorrhagic anemia: Secondary | ICD-10-CM

## 2011-12-31 DIAGNOSIS — Z79899 Other long term (current) drug therapy: Secondary | ICD-10-CM

## 2011-12-31 DIAGNOSIS — Z7982 Long term (current) use of aspirin: Secondary | ICD-10-CM

## 2011-12-31 DIAGNOSIS — Z96619 Presence of unspecified artificial shoulder joint: Secondary | ICD-10-CM

## 2011-12-31 DIAGNOSIS — E7800 Pure hypercholesterolemia, unspecified: Secondary | ICD-10-CM | POA: Diagnosis present

## 2011-12-31 DIAGNOSIS — I10 Essential (primary) hypertension: Secondary | ICD-10-CM | POA: Diagnosis present

## 2011-12-31 DIAGNOSIS — Z9861 Coronary angioplasty status: Secondary | ICD-10-CM

## 2011-12-31 DIAGNOSIS — E1169 Type 2 diabetes mellitus with other specified complication: Secondary | ICD-10-CM | POA: Diagnosis present

## 2011-12-31 DIAGNOSIS — E119 Type 2 diabetes mellitus without complications: Secondary | ICD-10-CM | POA: Diagnosis present

## 2011-12-31 DIAGNOSIS — Z87891 Personal history of nicotine dependence: Secondary | ICD-10-CM

## 2011-12-31 DIAGNOSIS — Z8546 Personal history of malignant neoplasm of prostate: Secondary | ICD-10-CM

## 2011-12-31 HISTORY — DX: Other forms of dyspnea: R06.09

## 2011-12-31 HISTORY — DX: Type 2 diabetes mellitus without complications: E11.9

## 2011-12-31 HISTORY — PX: TOTAL KNEE ARTHROPLASTY: SHX125

## 2011-12-31 HISTORY — DX: Dyspnea, unspecified: R06.00

## 2011-12-31 HISTORY — DX: Malignant neoplasm of prostate: C61

## 2011-12-31 LAB — GLUCOSE, CAPILLARY: Glucose-Capillary: 150 mg/dL — ABNORMAL HIGH (ref 70–99)

## 2011-12-31 SURGERY — ARTHROPLASTY, KNEE, TOTAL
Anesthesia: General | Site: Knee | Laterality: Left | Wound class: Clean

## 2011-12-31 MED ORDER — CELECOXIB 200 MG PO CAPS
ORAL_CAPSULE | ORAL | Status: AC
  Filled 2011-12-31: qty 1

## 2011-12-31 MED ORDER — SUFENTANIL CITRATE 50 MCG/ML IV SOLN
INTRAVENOUS | Status: DC | PRN
  Administered 2011-12-31: 20 ug via INTRAVENOUS

## 2011-12-31 MED ORDER — TOBRAMYCIN SULFATE 1.2 G IJ SOLR
INTRAMUSCULAR | Status: AC
  Filled 2011-12-31: qty 1.2

## 2011-12-31 MED ORDER — ONDANSETRON HCL 4 MG PO TABS: 4.0000 mg | ORAL_TABLET | Freq: Four times a day (QID) | ORAL | Status: DC | PRN

## 2011-12-31 MED ORDER — PANTOPRAZOLE SODIUM 40 MG PO TBEC
40.0000 mg | DELAYED_RELEASE_TABLET | Freq: Every day | ORAL | Status: DC
  Administered 2011-12-31 – 2012-01-02 (×3): 40 mg via ORAL
  Filled 2011-12-31 (×3): qty 1

## 2011-12-31 MED ORDER — ONDANSETRON HCL 4 MG/2ML IJ SOLN: 4.0000 mg | Freq: Four times a day (QID) | INTRAMUSCULAR | Status: DC | PRN

## 2011-12-31 MED ORDER — CELECOXIB 200 MG PO CAPS
200.0000 mg | ORAL_CAPSULE | Freq: Every day | ORAL | Status: DC
  Administered 2011-12-31 – 2012-01-02 (×3): 200 mg via ORAL
  Filled 2011-12-31 (×2): qty 1

## 2011-12-31 MED ORDER — METOCLOPRAMIDE HCL 10 MG PO TABS: 5.0000 mg | ORAL_TABLET | Freq: Three times a day (TID) | ORAL | Status: DC | PRN

## 2011-12-31 MED ORDER — LIDOCAINE HCL (CARDIAC) 20 MG/ML IV SOLN
INTRAVENOUS | Status: DC | PRN
  Administered 2011-12-31: 100 mg via INTRAVENOUS

## 2011-12-31 MED ORDER — LACTATED RINGERS IV SOLN
INTRAVENOUS | Status: DC | PRN
  Administered 2011-12-31 (×2): via INTRAVENOUS

## 2011-12-31 MED ORDER — ENOXAPARIN SODIUM 30 MG/0.3ML ~~LOC~~ SOLN
30.0000 mg | Freq: Two times a day (BID) | SUBCUTANEOUS | Status: DC
  Administered 2012-01-01 – 2012-01-02 (×3): 30 mg via SUBCUTANEOUS
  Filled 2011-12-31 (×6): qty 0.3

## 2011-12-31 MED ORDER — PHENOL 1.4 % MT LIQD: 1.0000 | OROMUCOSAL | Status: DC | PRN

## 2011-12-31 MED ORDER — INSULIN ASPART 100 UNIT/ML ~~LOC~~ SOLN
0.0000 [IU] | Freq: Three times a day (TID) | SUBCUTANEOUS | Status: DC
  Administered 2011-12-31: 2 [IU] via SUBCUTANEOUS
  Administered 2011-12-31 – 2012-01-02 (×3): 3 [IU] via SUBCUTANEOUS

## 2011-12-31 MED ORDER — FENTANYL CITRATE 0.05 MG/ML IJ SOLN: 50.0000 ug | INTRAMUSCULAR | Status: DC | PRN

## 2011-12-31 MED ORDER — VANCOMYCIN HCL IN DEXTROSE 1-5 GM/200ML-% IV SOLN
1000.0000 mg | Freq: Two times a day (BID) | INTRAVENOUS | Status: AC
  Administered 2011-12-31: 1000 mg via INTRAVENOUS
  Filled 2011-12-31: qty 200

## 2011-12-31 MED ORDER — OXYCODONE HCL 5 MG PO TABS
5.0000 mg | ORAL_TABLET | ORAL | Status: DC | PRN
  Administered 2011-12-31 (×2): 10 mg via ORAL
  Administered 2012-01-01: 5 mg via ORAL
  Administered 2012-01-01: 10 mg via ORAL
  Administered 2012-01-02 (×2): 5 mg via ORAL
  Filled 2011-12-31 (×3): qty 1
  Filled 2011-12-31 (×2): qty 2

## 2011-12-31 MED ORDER — ACETAMINOPHEN 10 MG/ML IV SOLN
INTRAVENOUS | Status: DC | PRN
  Administered 2011-12-31: 1000 mg via INTRAVENOUS

## 2011-12-31 MED ORDER — HYDROMORPHONE HCL PF 1 MG/ML IJ SOLN
0.2500 mg | INTRAMUSCULAR | Status: DC | PRN
  Administered 2011-12-31 (×2): 0.25 mg via INTRAVENOUS

## 2011-12-31 MED ORDER — ACETAMINOPHEN 650 MG RE SUPP: 650.0000 mg | Freq: Four times a day (QID) | RECTAL | Status: DC | PRN

## 2011-12-31 MED ORDER — PREDNISONE 10 MG PO TABS
10.0000 mg | ORAL_TABLET | Freq: Every day | ORAL | Status: DC
  Filled 2011-12-31 (×3): qty 1

## 2011-12-31 MED ORDER — METFORMIN HCL 500 MG PO TABS
1000.0000 mg | ORAL_TABLET | Freq: Two times a day (BID) | ORAL | Status: DC
  Administered 2012-01-02: 1000 mg via ORAL
  Filled 2011-12-31 (×3): qty 2

## 2011-12-31 MED ORDER — SODIUM CHLORIDE 0.9 % IR SOLN
Status: DC | PRN
  Administered 2011-12-31: 1000 mL

## 2011-12-31 MED ORDER — POTASSIUM CHLORIDE IN NACL 20-0.9 MEQ/L-% IV SOLN
INTRAVENOUS | Status: DC
  Administered 2011-12-31 – 2012-01-01 (×2): via INTRAVENOUS
  Filled 2011-12-31 (×7): qty 1000

## 2011-12-31 MED ORDER — CEFAZOLIN SODIUM 1-5 GM-% IV SOLN
INTRAVENOUS | Status: AC
  Filled 2011-12-31: qty 100

## 2011-12-31 MED ORDER — DROPERIDOL 2.5 MG/ML IJ SOLN
INTRAMUSCULAR | Status: DC | PRN
  Administered 2011-12-31: 0.625 mg via INTRAVENOUS

## 2011-12-31 MED ORDER — METOCLOPRAMIDE HCL 5 MG/ML IJ SOLN: 5.0000 mg | Freq: Three times a day (TID) | INTRAMUSCULAR | Status: DC | PRN

## 2011-12-31 MED ORDER — ACETAMINOPHEN 10 MG/ML IV SOLN
INTRAVENOUS | Status: AC
  Filled 2011-12-31: qty 100

## 2011-12-31 MED ORDER — INSULIN ASPART 100 UNIT/ML ~~LOC~~ SOLN: 0.0000 [IU] | Freq: Every day | SUBCUTANEOUS | Status: DC

## 2011-12-31 MED ORDER — PROPOFOL 10 MG/ML IV EMUL
INTRAVENOUS | Status: DC | PRN
  Administered 2011-12-31: 150 mg via INTRAVENOUS

## 2011-12-31 MED ORDER — ONDANSETRON HCL 4 MG/2ML IJ SOLN
INTRAMUSCULAR | Status: DC | PRN
  Administered 2011-12-31: 4 mg via INTRAVENOUS

## 2011-12-31 MED ORDER — NITROGLYCERIN 0.4 MG SL SUBL: 0.4000 mg | SUBLINGUAL_TABLET | SUBLINGUAL | Status: DC | PRN

## 2011-12-31 MED ORDER — GLYCOPYRROLATE 0.2 MG/ML IJ SOLN
INTRAMUSCULAR | Status: DC | PRN
  Administered 2011-12-31: 0.2 mg via INTRAVENOUS
  Administered 2011-12-31: 0.4 mg via INTRAVENOUS

## 2011-12-31 MED ORDER — BUPIVACAINE-EPINEPHRINE PF 0.25-1:200000 % IJ SOLN
INTRAMUSCULAR | Status: AC
  Filled 2011-12-31: qty 30

## 2011-12-31 MED ORDER — MIDAZOLAM HCL 2 MG/2ML IJ SOLN: 1.0000 mg | INTRAMUSCULAR | Status: DC | PRN

## 2011-12-31 MED ORDER — ROCURONIUM BROMIDE 100 MG/10ML IV SOLN
INTRAVENOUS | Status: DC | PRN
  Administered 2011-12-31: 50 mg via INTRAVENOUS

## 2011-12-31 MED ORDER — MIDAZOLAM HCL 5 MG/5ML IJ SOLN
INTRAMUSCULAR | Status: DC | PRN
  Administered 2011-12-31: 2 mg via INTRAVENOUS

## 2011-12-31 MED ORDER — BUPIVACAINE-EPINEPHRINE PF 0.5-1:200000 % IJ SOLN
INTRAMUSCULAR | Status: DC | PRN
  Administered 2011-12-31: 30 mL

## 2011-12-31 MED ORDER — ACETAMINOPHEN 325 MG PO TABS: 650.0000 mg | ORAL_TABLET | Freq: Four times a day (QID) | ORAL | Status: DC | PRN

## 2011-12-31 MED ORDER — HYDROMORPHONE HCL PF 1 MG/ML IJ SOLN
INTRAMUSCULAR | Status: AC
  Administered 2011-12-31: 0.5 mg via INTRAVENOUS
  Filled 2011-12-31: qty 1

## 2011-12-31 MED ORDER — BUPIVACAINE-EPINEPHRINE 0.25% -1:200000 IJ SOLN
INTRAMUSCULAR | Status: DC | PRN
  Administered 2011-12-31: 30 mL

## 2011-12-31 MED ORDER — MENTHOL 3 MG MT LOZG
1.0000 | LOZENGE | OROMUCOSAL | Status: DC | PRN
  Administered 2011-12-31: 3 mg via ORAL
  Filled 2011-12-31: qty 9

## 2011-12-31 MED ORDER — BENAZEPRIL HCL 20 MG PO TABS
20.0000 mg | ORAL_TABLET | Freq: Every day | ORAL | Status: DC
  Administered 2012-01-01 – 2012-01-02 (×2): 20 mg via ORAL
  Filled 2011-12-31 (×2): qty 1

## 2011-12-31 MED ORDER — ONDANSETRON HCL 4 MG/2ML IJ SOLN: 4.0000 mg | Freq: Once | INTRAMUSCULAR | Status: DC | PRN

## 2011-12-31 MED ORDER — HYDROMORPHONE HCL PF 1 MG/ML IJ SOLN
0.5000 mg | INTRAMUSCULAR | Status: DC | PRN
  Administered 2011-12-31: 1 mg via INTRAVENOUS
  Administered 2011-12-31: 0.5 mg via INTRAVENOUS
  Administered 2012-01-01 (×2): 1 mg via INTRAVENOUS
  Filled 2011-12-31 (×4): qty 1

## 2011-12-31 MED ORDER — BISACODYL 5 MG PO TBEC
10.0000 mg | DELAYED_RELEASE_TABLET | Freq: Every day | ORAL | Status: DC
  Administered 2011-12-31 – 2012-01-01 (×2): 10 mg via ORAL
  Filled 2011-12-31 (×2): qty 2

## 2011-12-31 MED ORDER — DEXAMETHASONE SODIUM PHOSPHATE 4 MG/ML IJ SOLN
INTRAMUSCULAR | Status: DC | PRN
  Administered 2011-12-31: 4 mg via INTRAVENOUS

## 2011-12-31 MED ORDER — DOCUSATE SODIUM 100 MG PO CAPS
100.0000 mg | ORAL_CAPSULE | Freq: Two times a day (BID) | ORAL | Status: DC
  Administered 2011-12-31 – 2012-01-02 (×4): 100 mg via ORAL
  Filled 2011-12-31 (×7): qty 1

## 2011-12-31 MED ORDER — TOBRAMYCIN SULFATE 1.2 G IJ SOLR
INTRAMUSCULAR | Status: DC | PRN
  Administered 2011-12-31: 1.2 g

## 2011-12-31 MED ORDER — GLYBURIDE 2.5 MG PO TABS
2.5000 mg | ORAL_TABLET | Freq: Two times a day (BID) | ORAL | Status: DC
  Administered 2012-01-02: 2.5 mg via ORAL
  Filled 2011-12-31 (×3): qty 1

## 2011-12-31 MED ORDER — NEOSTIGMINE METHYLSULFATE 1 MG/ML IJ SOLN
INTRAMUSCULAR | Status: DC | PRN
  Administered 2011-12-31: 3 mg via INTRAVENOUS

## 2011-12-31 MED ORDER — AMLODIPINE BESYLATE 10 MG PO TABS
10.0000 mg | ORAL_TABLET | Freq: Every day | ORAL | Status: DC
  Administered 2012-01-01 – 2012-01-02 (×2): 10 mg via ORAL
  Filled 2011-12-31 (×3): qty 1

## 2011-12-31 MED ORDER — OXYCODONE HCL 5 MG PO TABS
ORAL_TABLET | ORAL | Status: AC
  Filled 2011-12-31: qty 2

## 2011-12-31 MED ORDER — LIDOCAINE HCL 4 % MT SOLN
OROMUCOSAL | Status: DC | PRN
  Administered 2011-12-31: 4 mL via TOPICAL

## 2011-12-31 MED ORDER — ATORVASTATIN CALCIUM 20 MG PO TABS
20.0000 mg | ORAL_TABLET | Freq: Every day | ORAL | Status: DC
  Administered 2011-12-31 – 2012-01-01 (×2): 20 mg via ORAL
  Filled 2011-12-31 (×3): qty 1

## 2011-12-31 MED ORDER — INSULIN ASPART 100 UNIT/ML ~~LOC~~ SOLN
SUBCUTANEOUS | Status: AC
  Administered 2011-12-31: 2 [IU] via SUBCUTANEOUS
  Filled 2011-12-31: qty 1

## 2011-12-31 SURGICAL SUPPLY — 74 items
BANDAGE ESMARK 6X9 LF (GAUZE/BANDAGES/DRESSINGS) ×1 IMPLANT
BLADE SAGITTAL 25.0X1.19X90 (BLADE) ×2 IMPLANT
BLADE SAW SGTL 11.0X1.19X90.0M (BLADE) IMPLANT
BLADE SAW SGTL 13.0X1.19X90.0M (BLADE) ×2 IMPLANT
BLADE SURG 10 STRL SS (BLADE) ×4 IMPLANT
BNDG CMPR 9X6 STRL LF SNTH (GAUZE/BANDAGES/DRESSINGS) ×1
BNDG CMPR MED 15X6 ELC VLCR LF (GAUZE/BANDAGES/DRESSINGS) ×1
BNDG ELASTIC 6X15 VLCR STRL LF (GAUZE/BANDAGES/DRESSINGS) ×2 IMPLANT
BNDG ESMARK 6X9 LF (GAUZE/BANDAGES/DRESSINGS) ×2
BOWL SMART MIX CTS (DISPOSABLE) ×2 IMPLANT
CEMENT HV SMART SET (Cement) ×4 IMPLANT
CLOTH BEACON ORANGE TIMEOUT ST (SAFETY) ×2 IMPLANT
CLSR STERI-STRIP ANTIMIC 1/2X4 (GAUZE/BANDAGES/DRESSINGS) ×1 IMPLANT
COVER BACK TABLE 24X17X13 BIG (DRAPES) IMPLANT
COVER PROBE W GEL 5X96 (DRAPES) ×1 IMPLANT
COVER SURGICAL LIGHT HANDLE (MISCELLANEOUS) ×2 IMPLANT
CUFF TOURNIQUET SINGLE 34IN LL (TOURNIQUET CUFF) ×2 IMPLANT
CUFF TOURNIQUET SINGLE 44IN (TOURNIQUET CUFF) IMPLANT
DRAPE EXTREMITY T 121X128X90 (DRAPE) ×2 IMPLANT
DRAPE INCISE IOBAN 66X45 STRL (DRAPES) ×2 IMPLANT
DRAPE PROXIMA HALF (DRAPES) ×2 IMPLANT
DRAPE U-SHAPE 47X51 STRL (DRAPES) ×2 IMPLANT
DRSG ADAPTIC 3X8 NADH LF (GAUZE/BANDAGES/DRESSINGS) ×2 IMPLANT
DRSG PAD ABDOMINAL 8X10 ST (GAUZE/BANDAGES/DRESSINGS) ×4 IMPLANT
DURAPREP 26ML APPLICATOR (WOUND CARE) ×2 IMPLANT
ELECT CAUTERY BLADE 6.4 (BLADE) ×2 IMPLANT
ELECT REM PT RETURN 9FT ADLT (ELECTROSURGICAL) ×2
ELECTRODE REM PT RTRN 9FT ADLT (ELECTROSURGICAL) ×1 IMPLANT
EVACUATOR 1/8 PVC DRAIN (DRAIN) ×1 IMPLANT
FACESHIELD LNG OPTICON STERILE (SAFETY) ×3 IMPLANT
GLOVE BIO SURGEON STRL SZ7 (GLOVE) ×2 IMPLANT
GLOVE BIOGEL PI IND STRL 7.0 (GLOVE) ×1 IMPLANT
GLOVE BIOGEL PI IND STRL 7.5 (GLOVE) ×1 IMPLANT
GLOVE BIOGEL PI IND STRL 8 (GLOVE) IMPLANT
GLOVE BIOGEL PI INDICATOR 7.0 (GLOVE) ×2
GLOVE BIOGEL PI INDICATOR 7.5 (GLOVE) ×1
GLOVE BIOGEL PI INDICATOR 8 (GLOVE) ×1
GLOVE SS BIOGEL STRL SZ 7.5 (GLOVE) ×1 IMPLANT
GLOVE SUPERSENSE BIOGEL SZ 7.5 (GLOVE) ×1
GLOVE SURG SS PI 6.5 STRL IVOR (GLOVE) ×1 IMPLANT
GLOVE SURG SS PI 8.0 STRL IVOR (GLOVE) ×1 IMPLANT
GOWN PREVENTION PLUS XLARGE (GOWN DISPOSABLE) ×5 IMPLANT
GOWN PREVENTION PLUS XXLARGE (GOWN DISPOSABLE) ×1 IMPLANT
GOWN STRL NON-REIN LRG LVL3 (GOWN DISPOSABLE) ×3 IMPLANT
HANDPIECE INTERPULSE COAX TIP (DISPOSABLE) ×2
HOOD PEEL AWAY FACE SHEILD DIS (HOOD) ×4 IMPLANT
IMMOBILIZER KNEE 22 UNIV (SOFTGOODS) ×1 IMPLANT
INSERT CUSHION PRONEVIEW LG (MISCELLANEOUS) ×1 IMPLANT
KIT BASIN OR (CUSTOM PROCEDURE TRAY) ×2 IMPLANT
KIT ROOM TURNOVER OR (KITS) ×2 IMPLANT
MANIFOLD NEPTUNE II (INSTRUMENTS) ×2 IMPLANT
NS IRRIG 1000ML POUR BTL (IV SOLUTION) ×2 IMPLANT
PACK TOTAL JOINT (CUSTOM PROCEDURE TRAY) ×2 IMPLANT
PAD ARMBOARD 7.5X6 YLW CONV (MISCELLANEOUS) ×4 IMPLANT
PAD CAST 4YDX4 CTTN HI CHSV (CAST SUPPLIES) ×1 IMPLANT
PADDING CAST COTTON 4X4 STRL (CAST SUPPLIES) ×2
PADDING CAST COTTON 6X4 STRL (CAST SUPPLIES) ×2 IMPLANT
POSITIONER HEAD PRONE TRACH (MISCELLANEOUS) ×2 IMPLANT
RUBBERBAND STERILE (MISCELLANEOUS) ×2 IMPLANT
SET HNDPC FAN SPRY TIP SCT (DISPOSABLE) ×1 IMPLANT
SPONGE GAUZE 4X4 12PLY (GAUZE/BANDAGES/DRESSINGS) ×2 IMPLANT
STRIP CLOSURE SKIN 1/2X4 (GAUZE/BANDAGES/DRESSINGS) ×1 IMPLANT
SUCTION FRAZIER TIP 10 FR DISP (SUCTIONS) ×2 IMPLANT
SUT MNCRL AB 3-0 PS2 18 (SUTURE) ×2 IMPLANT
SUT VIC AB 0 CT1 27 (SUTURE) ×4
SUT VIC AB 0 CT1 27XBRD ANBCTR (SUTURE) ×2 IMPLANT
SUT VIC AB 2-0 CT1 27 (SUTURE) ×4
SUT VIC AB 2-0 CT1 TAPERPNT 27 (SUTURE) ×2 IMPLANT
SUT VLOC 180 0 24IN GS25 (SUTURE) ×2 IMPLANT
SYR 30ML SLIP (SYRINGE) ×2 IMPLANT
TOWEL OR 17X24 6PK STRL BLUE (TOWEL DISPOSABLE) ×1 IMPLANT
TOWEL OR 17X26 10 PK STRL BLUE (TOWEL DISPOSABLE) ×2 IMPLANT
TRAY FOLEY CATH 14FR (SET/KITS/TRAYS/PACK) ×2 IMPLANT
WATER STERILE IRR 1000ML POUR (IV SOLUTION) ×5 IMPLANT

## 2011-12-31 NOTE — Interval H&P Note (Signed)
History and Physical Interval Note:  12/31/2011 11:06 AM  Ryan Hunt  has presented today for surgery, with the diagnosis of DJD LEFT KNEE  The various methods of treatment have been discussed with the patient and family. After consideration of risks, benefits and other options for treatment, the patient has consented to  Procedure(s) (LRB): TOTAL KNEE ARTHROPLASTY (Left) as a surgical intervention .  The patient's history has been reviewed, patient examined, no change in status, stable for surgery.  I have reviewed the patients' chart and labs.  Questions were answered to the patient's satisfaction.     Elsie Saas A

## 2011-12-31 NOTE — Progress Notes (Signed)
Orthopedic Tech Progress Note Patient Details:  Ryan Hunt 1931-02-18 [id number]  CPM Left Knee CPM Left Knee: On Left Knee Flexion (Degrees): 90  Left Knee Extension (Degrees): 0  Additional Comments: trapeze bar   Cammer, Theodoro Parma 12/31/2011, 2:12 PM

## 2011-12-31 NOTE — Op Note (Signed)
MRN:     [id number] DOB/AGE:    [birth year] / 78 y.o.       OPERATIVE REPORT    DATE OF PROCEDURE:  12/31/2011       PREOPERATIVE DIAGNOSIS:   DJD LEFT KNEE      There is no height or weight on file to calculate BMI.                                                        POSTOPERATIVE DIAGNOSIS:   DJD LEFT KNEE                                                                      PROCEDURE:  Procedure(s): TOTAL KNEE ARTHROPLASTY Using Depuy Sigma RP implants #4 Femur, #5Tibia, 68mm sigma RP bearing, 35 Patella     SURGEON: Dhruti Ghuman A    ASSISTANT:  Kirstin Shepperson PA-C   (Present and scrubbed throughout the case, critical for assistance with exposure, retraction, instrumentation, and closure.)         ANESTHESIA: GET with Femoral Nerve Block  DRAINS: foley, 2 medium hemovac in knee   TOURNIQUET TIME: [id number]   COMPLICATIONS:  None     SPECIMENS: None   INDICATIONS FOR PROCEDURE: The patient has  DJD LEFT KNEE, varus deformities, XR shows bone on bone arthritis. Patient has failed all conservative measures including anti-inflammatory medicines, narcotics, attempts at  exercise and weight loss, cortisone injections and viscosupplementation.  Risks and benefits of surgery have been discussed, questions answered.   DESCRIPTION OF PROCEDURE: The patient identified by armband, received  right femoral nerve block and IV antibiotics, in the holding area at [facility]. Patient taken to the operating room, appropriate anesthetic  monitors were attached General endotracheal anesthesia induced with  the patient in supine position, Foley catheter was inserted. Tourniquet  applied high to the operative thigh. Lateral post and foot positioner  applied to the table, the lower extremity was then prepped and draped  in usual sterile fashion from the ankle to the tourniquet. Time-out procedure was performed. The limb was wrapped with an Esmarch bandage and the tourniquet inflated to 365  mmHg. We began the operation by making the anterior midline incision starting at handbreadth above the patella going over the patella 1 cm medial to and  4 cm distal to the tibial tubercle. Small bleeders in the skin and the  subcutaneous tissue identified and cauterized. Transverse retinaculum was incised and reflected medially and a medial parapatellar arthrotomy was accomplished. the patella was everted and theprepatellar fat pad resected. The superficial medial collateral  ligament was then elevated from anterior to posterior along the proximal  flare of the tibia and anterior half of the menisci resected. The knee was hyperflexed exposing bone on bone arthritis. Peripheral and notch osteophytes as well as the cruciate ligaments were then resected. We continued to  work our way around posteriorly along the proximal tibia, and externally  rotated the tibia subluxing it out from underneath the femur. A McHale  retractor was placed through the notch and a lateral  Hohmann retractor  placed, and we then drilled through the proximal tibia in line with the  axis of the tibia followed by an intramedullary guide rod and 2-degree  posterior slope cutting guide. The tibial cutting guide was pinned into place  allowing resection of 4 mm of bone medially and about 8 mm of bone  laterally because of her varus deformity. Satisfied with the tibial resection, we then  entered the distal femur 2 mm anterior to the PCL origin with the  intramedullary guide rod and applied the distal femoral cutting guide  set at 32mm, with 5 degrees of valgus. This was pinned along the  epicondylar axis. At this point, the distal femoral cut was accomplished without difficulty. We then sized for a #4 femoral component and pinned the guide in 3 degrees of external rotation.The chamfer cutting guide was pinned into place. The anterior, posterior, and chamfer cuts were accomplished without difficulty followed by  the Sigma RP box  cutting guide and the box cut. We also removed posterior osteophytes from the posterior femoral condyles. At this  time, the knee was brought into full extension. We checked our  extension and flexion gaps and found them symmetric at 57mm.  The patella thickness measured at 25 mm. We set the cutting guide at 15 and removed the posterior 9.5-10 mm  of the patella sized for 35 button and drilled the lollipop. The knee  was then once again hyperflexed exposing the proximal tibia. We sized for a #5 tibial base plate, applied the smokestack and the conical reamer followed by the the Delta fin keel punch. We then hammered into place the Sigma RP trial femoral component, inserted a 15-mm trial bearing, trial patellar button, and took the knee through range of motion from 0-130 degrees. No thumb pressure was required for patellar  tracking. At this point, all trial components were removed, a double batch of DePuy HV cement with 1500 mg of Zinacef was mixed and applied to all bony metallic mating surfaces except for the posterior condyles of the femur itself. In order, we  hammered into place the tibial tray and removed excess cement, the femoral component and removed excess cement, a 15-mm Sigma RP bearing  was inserted, and the knee brought to full extension with compression.  The patellar button was clamped into place, and excess cement  removed. While the cement cured the wound was irrigated out with normal saline solution pulse lavage, and medium Hemovac drains were placed.. Ligament stability and patellar tracking were checked and found to be excellent. The tourniquet was then released and hemostasis was obtained with cautery. The parapatellar arthrotomy was closed with  Z lock suture. The subcutaneous tissue with 0 and 2-0 undyed  Vicryl suture, and 4-0 Monocryl.. A dressing of Xeroform,  4 x 4, dressing sponges, Webril, and Ace wrap applied. Needle and sponge count were correct times 2.The patient  awakened, extubated, and taken to recovery room without difficulty. Vascular status was normal, pulses 2+ and symmetric.   Ray Glacken A 12/31/2011, 12:37 PM

## 2011-12-31 NOTE — Transfer of Care (Signed)
Immediate Anesthesia Transfer of Care Note  Patient: Ryan Hunt  Procedure(s) Performed: Procedure(s) (LRB): TOTAL KNEE ARTHROPLASTY (Left)  Patient Location: PACU  Anesthesia Type: General  Level of Consciousness: sedated  Airway & Oxygen Therapy: Patient Spontanous Breathing and Patient connected to nasal cannula oxygen  Post-op Assessment: Report given to PACU RN and Post -op Vital signs reviewed and stable  Post vital signs: Reviewed and stable  Complications: No apparent anesthesia complications

## 2011-12-31 NOTE — Anesthesia Procedure Notes (Signed)
Anesthesia Regional Block:  Femoral nerve block  Pre-Anesthetic Checklist: ,, timeout performed, Correct Patient, Correct Site, Correct Laterality, Correct Procedure, Correct Position, site marked, Risks and benefits discussed,  Surgical consent,  Pre-op evaluation,  At surgeon's request and post-op pain management  Laterality: Left and Lower  Prep: chloraprep       Needles:  Injection technique: Single-shot  Needle Type: Echogenic Needle     Needle Length: 9cm  Needle Gauge: 22 and 22 G    Additional Needles:  Procedures: ultrasound guided Femoral nerve block Narrative:  Start time: 12/31/2011 10:20 AM End time: 12/31/2011 10:30 AM Injection made incrementally with aspirations every 5 mL.  Performed by: Personally  Anesthesiologist: Lorrene Reid, MD  Additional Notes: Marcaine 0.5% with EPI 1:200000.  60ml.  Femoral nerve block

## 2011-12-31 NOTE — Anesthesia Preprocedure Evaluation (Signed)
Anesthesia Evaluation  Patient identified by MRN, date of birth, ID band Patient awake    Reviewed: Allergy & Precautions, H&P , NPO status , Patient's Chart, lab work & pertinent test results  Airway Mallampati: I TM Distance: >3 FB Neck ROM: Full    Dental  (+) Edentulous Upper, Edentulous Lower and Dental Advisory Given   Pulmonary  breath sounds clear to auscultation        Cardiovascular hypertension, Pt. on medications + CAD (Pt had stent placed by Dr. Tamala Julian 3 years ago. Doing well since.  ECG in Dr. Thompson Caul office in 2/13.  Pt reports ok.) Rhythm:Regular Rate:Normal     Neuro/Psych    GI/Hepatic GERD-  ,  Endo/Other  Diabetes mellitus-, Type 2, Oral Hypoglycemic Agents  Renal/GU      Musculoskeletal   Abdominal   Peds  Hematology   Anesthesia Other Findings   Reproductive/Obstetrics                           Anesthesia Physical Anesthesia Plan  ASA: III  Anesthesia Plan: General   Post-op Pain Management:    Induction: Intravenous  Airway Management Planned: LMA  Additional Equipment:   Intra-op Plan:   Post-operative Plan: Extubation in OR  Informed Consent: I have reviewed the patients History and Physical, chart, labs and discussed the procedure including the risks, benefits and alternatives for the proposed anesthesia with the patient or authorized representative who has indicated his/her understanding and acceptance.   Dental advisory given  Plan Discussed with: Anesthesiologist, Surgeon and CRNA  Anesthesia Plan Comments:         Anesthesia Quick Evaluation

## 2011-12-31 NOTE — Anesthesia Postprocedure Evaluation (Signed)
  Anesthesia Post-op Note  Patient: Ryan Hunt  Procedure(s) Performed: Procedure(s) (LRB): TOTAL KNEE ARTHROPLASTY (Left)  Patient Location: PACU  Anesthesia Type: GA combined with regional for post-op pain  Level of Consciousness: awake and alert   Airway and Oxygen Therapy: Patient Spontanous Breathing and Patient connected to nasal cannula oxygen  Post-op Pain: none  Post-op Assessment: Post-op Vital signs reviewed  Post-op Vital Signs: Reviewed  Complications: No apparent anesthesia complications

## 2011-12-31 NOTE — H&P (View-Only) (Signed)
Ryan Hunt is an 78 y.o. male.   Chief Complaint: left knee HPI: Ryan Hunt is a 78 year old seen for follow-up from significant pain in both knees left worse than right. We saw him on 04/24/11 and injected both knees with the third Supartz injection but this helped minimally. The excruciating pain is getting progressively worse, limiting activities of daily living, poses a significant fall risk, and has failed multiple conservative treatments including intraarticular cortisone injections, intraarticular Supartz, bracing, medication and home physical therapy.  Past Medical History  Diagnosis Date  . Hypertension   . Diabetes mellitus   . Bronchitis     hx of  . Shortness of breath     With ambulation  . GERD (gastroesophageal reflux disease)   . Arthritis   . Kidney stone     hx of  . Hypercholesterolemia     Past Surgical History  Procedure Date  . Joint replacement     Bilateral arthoscopic shoulders  . Prostate surgery     Removal  . Cardiac catheterization   . Coronary angioplasty     Stent X 1 in 2010  . Eye surgery     Bilateral lens implant approx 15 years ago  . Colonoscopy     No family history on file. Social History:  reports that he quit smoking about 40 years ago. He does not have any smokeless tobacco history on file. He reports that he drinks alcohol. He reports that he does not use illicit drugs.  Allergies:  Allergies  Allergen Reactions  . Amoxicillin Hives   Current Outpatient Prescriptions on File Prior to Visit  Medication Sig Dispense Refill  . amLODipine (NORVASC) 10 MG tablet Take 10 mg by mouth daily.      Marland Kitchen aspirin 325 MG tablet Take 325 mg by mouth daily.      . benazepril-hydrochlorthiazide (LOTENSIN HCT) 20-25 MG per tablet Take 1 tablet by mouth daily.      Marland Kitchen CINNAMON PO Take 1 tablet by mouth daily.      . fish oil-omega-3 fatty acids 1000 MG capsule Take 2 g by mouth daily.      Marland Kitchen glyBURIDE (DIABETA) 2.5 MG tablet Take 2.5 mg by  mouth 2 (two) times daily with a meal.      . metFORMIN (GLUCOPHAGE) 500 MG tablet Take 1,000 mg by mouth 2 (two) times daily with a meal.      . omeprazole (PRILOSEC) 20 MG capsule Take 20 mg by mouth daily.      . rosuvastatin (CRESTOR) 10 MG tablet Take 10 mg by mouth daily.      . nitroGLYCERIN (NITROSTAT) 0.4 MG SL tablet Place 0.4 mg under the tongue every 5 (five) minutes as needed. For chest pain        (Not in a hospital admission)  No results found for this or any previous visit (from the past 48 hour(s)). No results found.  Review of Systems  Constitutional: Negative.   HENT: Negative.   Eyes: Negative.   Respiratory: Negative.   Cardiovascular: Negative.   Gastrointestinal: Negative.   Genitourinary: Negative.   Musculoskeletal:       Left knee pain  Skin: Negative.   Neurological: Negative.   Endo/Heme/Allergies: Negative.   Psychiatric/Behavioral: Negative.     Blood pressure 162/70, pulse 64, temperature 98.1 F (36.7 C), height 5\' 7"  (1.702 m), weight 102.059 kg (225 lb), SpO2 95.00%. Physical Exam  Constitutional: He is oriented to person, place, and  time. He appears well-developed and well-nourished.  HENT:  Head: Normocephalic and atraumatic.  Mouth/Throat: Oropharynx is clear and moist.  Eyes: EOM are normal. Pupils are equal, round, and reactive to light.  Neck: Normal range of motion. Neck supple.  Cardiovascular: Normal rate, regular rhythm and normal heart sounds.   Respiratory: Effort normal and breath sounds normal.  GI: Soft. Bowel sounds are normal.  Genitourinary:       Not pertinent to current symptomatology therefore not examined.  Musculoskeletal:       Examination of both knees reveals 1 to 2+ crepitation 1+ synovitis range of motion 0-120 degrees both knees are stable with mild diffuse pain mild varus deformity left worse than right. Vascular exam: pulses 2+ and symmetric.  Neurological: He is alert and oriented to person, place, and time.   Skin: Skin is warm and dry.  Psychiatric: He has a normal mood and affect. His behavior is normal. Judgment and thought content normal.    Xrays: AP,LATERAL, FLEXION, and SUNRISE views show significant joint space narrowing, with periarticular osteophytes, and subchondral sclerosis.  Assessment Patient Active Problem List  Diagnoses  . Hypertension  . Bronchitis  . Shortness of breath  . Arthritis  . Hypercholesterolemia  . Left knee DJD  . Diabetes mellitus type 2 in obese   Plan I talk to him about this in detail. I recommend with his significant pain lack of response to conservative care that we schedule a left total knee replacement. Discussed risks benefits and possible complications of the surgery in detail and he understands this completely.   Ryan Hunt J 12/17/2011, 4:19 PM

## 2011-12-31 NOTE — Progress Notes (Signed)
UR COMPLETED  

## 2012-01-01 ENCOUNTER — Encounter (HOSPITAL_COMMUNITY): Payer: Self-pay | Admitting: General Practice

## 2012-01-01 LAB — GLUCOSE, CAPILLARY
Glucose-Capillary: 104 mg/dL — ABNORMAL HIGH (ref 70–99)
Glucose-Capillary: 112 mg/dL — ABNORMAL HIGH (ref 70–99)
Glucose-Capillary: 117 mg/dL — ABNORMAL HIGH (ref 70–99)
Glucose-Capillary: 184 mg/dL — ABNORMAL HIGH (ref 70–99)

## 2012-01-01 LAB — BASIC METABOLIC PANEL
CO2: 23 mEq/L (ref 19–32)
Chloride: 102 mEq/L (ref 96–112)
GFR calc non Af Amer: 76 mL/min — ABNORMAL LOW (ref >90)
Glucose, Bld: 131 mg/dL — ABNORMAL HIGH (ref 70–99)
Potassium: 4.5 mEq/L (ref 3.5–5.1)
Sodium: 135 mEq/L (ref 135–145)

## 2012-01-01 LAB — CBC
Hemoglobin: 11.6 g/dL — ABNORMAL LOW (ref 13.0–17.0)
MCHC: 34.9 g/dL (ref 30.0–36.0)
RBC: 3.71 MIL/uL — ABNORMAL LOW (ref 4.22–5.81)
WBC: 9.8 10*3/uL (ref 4.0–10.5)

## 2012-01-01 MED ORDER — SODIUM CHLORIDE 0.9 % IV BOLUS (SEPSIS): 500.0000 mL | Freq: Once | INTRAVENOUS | Status: DC

## 2012-01-01 MED ORDER — SODIUM CHLORIDE 0.9 % IV BOLUS (SEPSIS)
500.0000 mL | Freq: Once | INTRAVENOUS | Status: AC
  Administered 2012-01-01: 500 mL via INTRAVENOUS

## 2012-01-01 MED ORDER — TAMSULOSIN HCL 0.4 MG PO CAPS
0.4000 mg | ORAL_CAPSULE | Freq: Every day | ORAL | Status: DC
  Administered 2012-01-01 – 2012-01-02 (×2): 0.4 mg via ORAL
  Filled 2012-01-01 (×2): qty 1

## 2012-01-01 NOTE — Progress Notes (Signed)
Patient ID: Ryan Hunt, male   DOB: [birth year], 78 y.o.   MRN: [id number] PATIENT ID: Ryan Hunt  MRN: [id number]  DOB/AGE:  Feb 16, 1931 / 78 y.o.  1 Day Post-Op Procedure(s) (LRB): TOTAL KNEE ARTHROPLASTY (Left)    PROGRESS NOTE Subjective: Patient is alert, oriented, no Nausea, no Vomiting, yes passing gas, no Bowel Movement. Taking PO well. Denies SOB, Chest or Calf Pain. Using Incentive Spirometer, PAS in place. Ambulate not yet, CPM 0-90 Patient reports pain as 2 on 0-10 scale  .    Objective: Vital signs in last 24 hours: Filed Vitals:   12/31/11 1809 12/31/11 2218 01/01/12 0217 01/01/12 0616  BP: 141/71 147/63 122/58 124/56  Pulse: 71 60 71 64  Temp: 97.9 F (36.6 C) 98.2 F (36.8 C) 98.3 F (36.8 C) 98 F (36.7 C)  TempSrc:      Resp: 18 16 14 16   SpO2: 97% 94% 94% 96%      Intake/Output from previous day: I/O last 3 completed shifts: In: 3120 [P.O.:720; I.V.:2200; Other:200] Out: 3175 [Urine:2200; Drains:925; Blood:50]   Intake/Output this shift:     LABORATORY DATA:  Basename 01/01/12 0705 01/01/12 0500 12/31/11 2218 12/31/11 1550  WBC -- 9.8 -- --  HGB -- 11.6* -- --  HCT -- 33.2* -- --  PLT -- 123* -- --  NA -- -- -- --  K -- -- -- --  CL -- -- -- --  CO2 -- -- -- --  BUN -- -- -- --  CREATININE -- -- -- --  GLUCOSE -- -- -- --  GLUCAP 112* -- 146* 146*  INR -- -- -- --  CALCIUM -- -- -- --    Examination: ABD soft Neurovascular intact Sensation intact distally Intact pulses distally Dorsiflexion/Plantar flexion intact Incision: dressing C/D/I}  Assessment:   1 Day Post-Op Procedure(s) (LRB): TOTAL KNEE ARTHROPLASTY (Left) ADDITIONAL DIAGNOSIS:  Acute Blood Loss Anemia, Diabetes and Hypertension  Plan: PT/OT WBAT, CPM 6 hrs a day until ROM 0-90 degrees, DVT Prophylaxis:  [id number],lovenox DISCHARGE PLAN: Home DISCHARGE NEEDS: HHPT, CPM and Walker Fluid bolus times 2, dressing change, saline lock    Gerldine Suleiman  J 01/01/2012, 7:47 AM

## 2012-01-01 NOTE — Evaluation (Signed)
Physical Therapy Evaluation Patient Details Name: Ryan Hunt MRN: [id number] DOB: [birth year] Today's Date: 01/01/2012 Time: [id number] PT Time Calculation (min): 36 min  PT Assessment / Plan / Recommendation Clinical Impression  Pt s/p L TKA presenting with increased L knee pain, decreased L LE strength and knee ROM. Anticipate patient to be safe for d/c home with assist of wife 24/7, HHPT, and recommended DME.    PT Assessment       Follow Up Recommendations  Home health PT;Supervision/Assistance - 24 hour    Barriers to Discharge        lEquipment Recommendations   (pt reports having RW, BSC, and shower chair)    Recommendations for Other Services     Frequency      Precautions / Restrictions Precautions Precautions: Knee Required Braces or Orthoses: Knee Immobilizer - Left Knee Immobilizer - Left: On when out of bed or walking;Discontinue post op day 2 Restrictions LLE Weight Bearing: Weight bearing as tolerated   Pertinent Vitals/Pain 4/10 L knee pain      Mobility  Bed Mobility Bed Mobility: Supine to Sit Supine to Sit: 3: Mod assist;HOB flat Details for Bed Mobility Assistance: assist for trunk elevation and directional v/c's for technique Transfers Transfers: Sit to Stand;Stand to Sit Sit to Stand: 4: Min assist;With upper extremity assist;From bed Stand to Sit: 4: Min assist;With upper extremity assist;To chair/3-in-1;With armrests Details for Transfer Assistance: v/c's for safe hand placement and L LE management Ambulation/Gait Ambulation/Gait Assistance: 4: Min assist Ambulation Distance (Feet): 40 Feet Assistive device: Rolling walker Ambulation/Gait Assistance Details: v/c's for sequencing and to maintain L quad set during stance phase to prevent L knee buckling Gait Pattern: Step-to pattern;Decreased step length - left;Decreased stance time - left;Antalgic Gait velocity: slow General Gait Details: onset of fatigue Stairs: No    Exercises Total  Joint Exercises Ankle Circles/Pumps: AROM;Both;10 reps;Supine Quad Sets: AROM;Left;10 reps;Supine Heel Slides: AROM;Left;AAROM;10 reps;Supine Goniometric ROM: 50 deg active L knee flex   PT Diagnosis:    PT Problem List:   PT Treatment Interventions:     PT Goals Acute Rehab PT Goals PT Goal Formulation: With patient Time For Goal Achievement: 01/08/12 Potential to Achieve Goals: Good Pt will go Supine/Side to Sit: with modified independence;with HOB 0 degrees PT Goal: Supine/Side to Sit - Progress: Goal set today Pt will go Sit to Supine/Side: with modified independence;with HOB 0 degrees PT Goal: Sit to Supine/Side - Progress: Goal set today Pt will go Sit to Stand: with modified independence;with upper extremity assist (up to RW.) PT Goal: Sit to Stand - Progress: Goal set today Pt will Ambulate: >150 feet;with modified independence;with rolling walker PT Goal: Ambulate - Progress: Goal set today Pt will Go Up / Down Stairs: 1-2 stairs;with supervision;with rolling walker (1 platform step to enter home.) PT Goal: Up/Down Stairs - Progress: Goal set today Pt will Perform Home Exercise Program: Independently PT Goal: Perform Home Exercise Program - Progress: Goal set today  Visit Information  Last PT Received On: 01/01/12 Assistance Needed: +1    Subjective Data  Subjective: Pt received supine in bed with report of 4/10 L knee pain   Prior Functioning  Home Living Lives With: Spouse Available Help at Discharge: Family;Available 24 hours/day Type of Home: House Home Access: Stairs to enter [employer] of Steps: 1 Entrance Stairs-Rails: None Home Layout: One level Bathroom Shower/Tub: Multimedia programmer: Handicapped height Bathroom Accessibility: Yes How Accessible: Accessible via walker [place]: Bedside commode/3-in-1;Walker -  rolling;Shower chair with back Prior Function Level of Independence: Independent Able to Take Stairs?:  Yes Driving: Yes Vocation: Retired Corporate investment banker: HOH (wears hearing aids) Dominant Hand: Left    Cognition  Overall Cognitive Status: Appears within functional limits for tasks assessed/performed Arousal/Alertness: Awake/alert Orientation Level: Oriented X4 / Intact Behavior During Session: WFL for tasks performed    Extremity/Trunk Assessment Right Upper Extremity Assessment RUE ROM/Strength/Tone: Within functional levels Left Upper Extremity Assessment LUE ROM/Strength/Tone: Within functional levels Right Lower Extremity Assessment RLE ROM/Strength/Tone: Within functional levels Left Lower Extremity Assessment LLE ROM/Strength/Tone: Deficits;Due to pain LLE ROM/Strength/Tone Deficits: able to initiate quad set, 50 degrees active knee flex Trunk Assessment Trunk Assessment: Normal   Balance    End of Session PT - End of Session Equipment Utilized During Treatment: Gait belt;Left knee immobilizer Activity Tolerance: Patient limited by fatigue Patient left: in chair;with call bell/phone within reach;with family/visitor present Nurse Communication: Mobility status   Kingsley Callander 01/01/2012, 10:49 AM   Kittie Plater, PT, DPT Pager #: [phone] Office #: [phone]

## 2012-01-01 NOTE — Progress Notes (Signed)
PT Progress Note:     01/01/12 1200  PT Visit Information  Last PT Received On 01/01/12  Assistance Needed +1  PT Time Calculation  PT Start Time 1110  PT Stop Time 1143  PT Time Calculation (min) 33 min  Subjective Data  Subjective "Im feeling a little under the weather"  Precautions  Precautions Knee  Required Braces or Orthoses Knee Immobilizer - Left  Knee Immobilizer - Left On when out of bed or walking;Discontinue post op day 2  Restrictions  LLE Weight Bearing WBAT  Cognition  Overall Cognitive Status Appears within functional limits for tasks assessed/performed  Arousal/Alertness Awake/alert  Orientation Level Oriented X4 / Intact  Behavior During Session [facility] for tasks performed  Bed Mobility  Bed Mobility Not assessed  Transfers  Transfers Sit to Stand;Stand to Sit  Sit to Stand 4: Min guard;With upper extremity assist;With armrests;From chair/3-in-1  Stand to Sit 4: Min guard;With upper extremity assist;With armrests;To chair/3-in-1  Details for Transfer Assistance Pt demo'd safe technique.   Ambulation/Gait  Ambulation/Gait Assistance 4: Min guard  Ambulation Distance (Feet) 90 Feet  Assistive device Rolling walker  Ambulation/Gait Assistance Details Cues to look up, tall posture, & sequencing initially.  Pt beginning to progress to reciprocal gait.    Gait Pattern Step-to pattern;Step-through pattern;Decreased stance time - left;Decreased step length - right  General Gait Details Pt c/o nausea after ~15' but resolved quickly with seated rest break & then able to complete distance without complaints  Stairs No  Wheelchair Mobility  Wheelchair Mobility No  Balance  Balance Assessed No  Exercises  Exercises Total Joint  Total Joint Exercises  Ankle Circles/Pumps AROM;Both;10 reps;Supine  Quad Sets AROM;Left;10 reps;Supine  Heel Slides AROM;Left;10 reps  Hip ABduction/ADduction AROM;Left;10 reps  Straight Leg Raises AAROM;Left;10 reps  PT - End of Session    Equipment Utilized During Treatment Gait belt;Left knee immobilizer  Activity Tolerance Patient tolerated treatment well  Patient left in chair;with call bell/phone within reach;with family/visitor present  Nurse Communication Mobility status  PT - Assessment/Plan  Comments on Treatment Session Pt making good progress with PT goals + mobility this session.  On track for a safe d/c home when MD feels medically appropriate.  Will trial step before d/c.    PT Plan Discharge plan remains appropriate  Follow Up Recommendations Home health PT;Supervision/Assistance - 24 hour  Acute Rehab PT Goals  Time For Goal Achievement 01/08/12  Potential to Achieve Goals Good  PT Goal: Sit to Stand - Progress Progressing toward goal  PT Goal: Ambulate - Progress Progressing toward goal  PT Goal: Perform Home Exercise Program - Progress Progressing toward goal  PT General Charges  $$ ACUTE PT VISIT 1 Procedure  PT Treatments  $Gait Training 8-22 mins  $Therapeutic Exercise 8-22 mins     [name], [place] [phone] 01/01/2012

## 2012-01-02 DIAGNOSIS — D62 Acute posthemorrhagic anemia: Secondary | ICD-10-CM

## 2012-01-02 LAB — BASIC METABOLIC PANEL
CO2: 20 mEq/L (ref 19–32)
Glucose, Bld: 185 mg/dL — ABNORMAL HIGH (ref 70–99)
Potassium: 3.9 mEq/L (ref 3.5–5.1)
Sodium: 136 mEq/L (ref 135–145)

## 2012-01-02 LAB — CBC
Hemoglobin: 10.8 g/dL — ABNORMAL LOW (ref 13.0–17.0)
MCH: 31.2 pg (ref 26.0–34.0)
RBC: 3.46 MIL/uL — ABNORMAL LOW (ref 4.22–5.81)

## 2012-01-02 LAB — GLUCOSE, CAPILLARY: Glucose-Capillary: 177 mg/dL — ABNORMAL HIGH (ref 70–99)

## 2012-01-02 MED ORDER — ALBUTEROL SULFATE (5 MG/ML) 0.5% IN NEBU
2.5000 mg | INHALATION_SOLUTION | RESPIRATORY_TRACT | Status: DC | PRN
  Administered 2012-01-02: 2.5 mg via RESPIRATORY_TRACT
  Filled 2012-01-02: qty 0.5

## 2012-01-02 MED ORDER — CELECOXIB 200 MG PO CAPS: 200.0000 mg | ORAL_CAPSULE | Freq: Every day | ORAL | Status: AC

## 2012-01-02 MED ORDER — BISACODYL 10 MG RE SUPP
10.0000 mg | Freq: Once | RECTAL | Status: AC
  Administered 2012-01-02: 10 mg via RECTAL
  Filled 2012-01-02: qty 1

## 2012-01-02 MED ORDER — BISACODYL 5 MG PO TBEC: DELAYED_RELEASE_TABLET | ORAL | Status: DC

## 2012-01-02 MED ORDER — ALBUTEROL SULFATE HFA 108 (90 BASE) MCG/ACT IN AERS: 2.0000 | INHALATION_SPRAY | RESPIRATORY_TRACT | Status: DC

## 2012-01-02 MED ORDER — METOCLOPRAMIDE HCL 10 MG PO TABS
10.0000 mg | ORAL_TABLET | Freq: Three times a day (TID) | ORAL | Status: DC
  Administered 2012-01-02 (×2): 10 mg via ORAL
  Filled 2012-01-02 (×2): qty 1

## 2012-01-02 MED ORDER — ENOXAPARIN SODIUM 30 MG/0.3ML ~~LOC~~ SOLN: 30.0000 mg | Freq: Two times a day (BID) | SUBCUTANEOUS | Status: DC

## 2012-01-02 MED ORDER — DSS 100 MG PO CAPS: 100.0000 mg | ORAL_CAPSULE | Freq: Two times a day (BID) | ORAL | Status: AC

## 2012-01-02 MED ORDER — TAMSULOSIN HCL 0.4 MG PO CAPS: 0.4000 mg | ORAL_CAPSULE | Freq: Every day | ORAL | Status: DC

## 2012-01-02 MED ORDER — METOCLOPRAMIDE HCL 5 MG/ML IJ SOLN: 5.0000 mg | Freq: Three times a day (TID) | INTRAMUSCULAR | Status: DC

## 2012-01-02 MED ORDER — OXYCODONE HCL 5 MG PO TABS: ORAL_TABLET | ORAL | Status: DC

## 2012-01-02 MED ORDER — ALBUTEROL SULFATE HFA 108 (90 BASE) MCG/ACT IN AERS
2.0000 | INHALATION_SPRAY | RESPIRATORY_TRACT | Status: DC
  Administered 2012-01-02 (×2): 2 via RESPIRATORY_TRACT
  Filled 2012-01-02: qty 6.7

## 2012-01-02 NOTE — Progress Notes (Signed)
CARE MANAGEMENT NOTE 01/02/2012  Patient:  Ryan Hunt, Ryan Hunt   Account Number:  [id number]  Date Initiated:  01/02/2012  Documentation initiated by:  Ricki Miller  Subjective/Objective Assessment:   78 yr old male s/p left total knee arthroplasty     Action/Plan:   Patient pre[facility] with [facility], no changes. Has DME.   Anticipated DC Date:  01/02/2012   Anticipated DC Plan:  [place]  CM consult      [facility] Choice  HOME HEALTH   Choice offered to / List presented to:          [facility] arranged  HH-2 PT      [place].   Status of service:  Completed, signed off Medicare Important Message given?   (If response is "NO", the following Medicare IM given date fields will be blank) Date Medicare IM given:   Date Additional Medicare IM given:    Discharge Disposition:  [place]  Per UR Regulation:    If discussed at Long Length of Stay Meetings, dates discussed:    Comments:

## 2012-01-02 NOTE — Progress Notes (Signed)
PT Progress Note:     01/02/12 1100  PT Visit Information  Last PT Received On 01/02/12  Assistance Needed +1  PT Time Calculation  PT Start Time 0825  PT Stop Time 0843  PT Time Calculation (min) 18 min  Precautions  Precautions Knee  Required Braces or Orthoses Knee Immobilizer - Left  Knee Immobilizer - Left On when out of bed or walking;Discontinue post op day 2  Restrictions  LLE Weight Bearing WBAT  Cognition  Overall Cognitive Status Appears within functional limits for tasks assessed/performed  Arousal/Alertness Awake/alert  Orientation Level Oriented X4 / Intact  Behavior During Session [facility] for tasks performed  Bed Mobility  Bed Mobility Not assessed  Transfers  Transfers Sit to Stand;Stand to Sit  Sit to Stand 4: Min assist;With upper extremity assist;From bed  Stand to Sit 4: Min assist;With upper extremity assist;With armrests;To chair/3-in-1  Details for Transfer Assistance Increased (A) required compared to last PT session.  Pt reports increased pain this AM.    Ambulation/Gait  Ambulation/Gait Assistance 4: Min guard  Ambulation Distance (Feet) 25 Feet  Assistive device Rolling walker  Ambulation/Gait Assistance Details Distance limited due to pain.  Appeared to have difficulty tolerating weight through LLE today.    Gait Pattern Step-to pattern;Decreased stance time - left;Decreased step length - right;Decreased step length - left;Antalgic  General Gait Details Pt reports increased pain today, along with nausea & mild lightheadiness but only reported nausea & lightheadiness when asked.    Stairs Yes  Stairs Assistance 3: Mod assist;4: Min assist  Stairs Assistance Details (indicate cue type and reason) Mod (A) for balance & to step up with forwards technique & Min (A) for backwards technique.  Cues for safe technique & sequencing.     Stair Management Technique No rails;Step to pattern;Backwards;Forwards;With walker  Number of Stairs 1  (3x's)  Wheelchair  Mobility  Wheelchair Mobility No  Balance  Balance Assessed No  PT - End of Session  Equipment Utilized During Treatment Gait belt  Activity Tolerance Patient limited by pain  Patient left in chair;with call bell/phone within reach;with family/visitor present  Nurse Communication Mobility status;Other (comment) (pt's pain & nausea)  PT - Assessment/Plan  Comments on Treatment Session Pt limited by significant pain as well as nausea.  Initiated stair training this session- pt will need to practice again before d/cing home today to ensure safety.    PT Plan Discharge plan remains appropriate  Follow Up Recommendations Home health PT;Supervision/Assistance - 24 hour  Acute Rehab PT Goals  Time For Goal Achievement 01/08/12  Potential to Achieve Goals Good  PT Goal: Sit to Stand - Progress Not met  PT Goal: Ambulate - Progress Not met  PT Goal: Up/Down Stairs - Progress Progressing toward goal     Pain:  9/10 Knee.  Premedicated.  RN made aware of pain & nausea during session.    [name], [place] [phone] 01/02/2012

## 2012-01-02 NOTE — Discharge Summary (Signed)
Patient ID: Ryan Hunt MRN: [id number] DOB/AGE: September 02, 1930 78 y.o.  Admit date: 12/31/2011 Discharge date: 01/02/2012  Admission Diagnoses:  Principal Problem:  *Left knee DJD Active Problems:  Hypertension  Shortness of breath  Arthritis  Diabetes mellitus type 2 in obese  Postoperative anemia due to acute blood loss   Discharge Diagnoses:  Same  Past Medical History  Diagnosis Date  . Hypertension   . Bronchitis 11/2011    "first time for me"  . GERD (gastroesophageal reflux disease)   . Hypercholesterolemia   . Exertional dyspnea   . Type II diabetes mellitus   . Kidney stone     "dr told me my kidney was loaded w/stones; imbedded"  . Prostate cancer   . Arthritis     "knees"    Surgeries: Procedure(s): TOTAL KNEE ARTHROPLASTY on 12/31/2011   Consultants:    Discharged Condition: Improved  Hospital Course: Ryan Hunt is an 78 y.o. male who was admitted 12/31/2011 for operative treatment ofLeft knee DJD. Patient has severe unremitting pain that affects sleep, daily activities, and work/hobbies. After pre-op clearance the patient was taken to the operating room on 12/31/2011 and underwent  Procedure(s): TOTAL KNEE ARTHROPLASTY.    Patient was given perioperative antibiotics: Anti-infectives     Start     Dose/Rate Route Frequency Ordered Stop   12/31/11 1600   vancomycin (VANCOCIN) IVPB 1000 mg/200 mL premix        1,000 mg 200 mL/hr over 60 Minutes Intravenous Every 12 hours 12/31/11 1516 12/31/11 1741   12/31/11 1150   tobramycin (NEBCIN) powder  Status:  Discontinued          As needed 12/31/11 1151 12/31/11 1316   12/30/11 0905   vancomycin (VANCOCIN) 1,500 mg in sodium chloride 0.9 % 500 mL IVPB        1,500 mg 250 mL/hr over 120 Minutes Intravenous 120 min pre-op 12/30/11 0905 12/31/11 1115           Patient was given sequential compression devices, early ambulation, and chemoprophylaxis to prevent DVT.  Patient benefited maximally from  hospital stay and there were no complications.    Recent vital signs: Patient Vitals for the past 24 hrs:  BP Temp Pulse Resp SpO2  01/02/12 0618 159/67 mmHg 99.2 F (37.3 C) 85  20  93 %  01/02/12 0339 - 100 F (37.8 C) - - -  Jan 11, 2012 2249 127/60 mmHg 99.2 F (37.3 C) 89  20  92 %  01/11/12 1114 130/52 mmHg - - - -  January 11, 2012 1009 130/52 mmHg - - - -     Recent laboratory studies:  Basename 01/02/12 0550 January 11, 2012 0500  WBC 9.4 9.8  HGB 10.8* 11.6*  HCT 31.0* 33.2*  PLT PENDING 123*  NA -- 135  K -- 4.5  CL -- 102  CO2 -- 23  BUN -- 16  CREATININE -- 0.98  GLUCOSE -- 131*  INR -- --  CALCIUM -- 8.8     Discharge Medications:   Medication List  As of 01/02/2012  7:39 AM   STOP taking these medications         aspirin 325 MG tablet      CINNAMON PO      fish oil-omega-3 fatty acids 1000 MG capsule      predniSONE 10 MG tablet         TAKE these medications         albuterol 108 (90 BASE) MCG/ACT inhaler  Commonly known as: PROVENTIL HFA;VENTOLIN HFA   Inhale 2 puffs into the lungs every 4 (four) hours.      amLODipine 10 MG tablet   Commonly known as: NORVASC   Take 10 mg by mouth daily.      benazepril-hydrochlorthiazide 20-25 MG per tablet   Commonly known as: LOTENSIN HCT   Take 1 tablet by mouth daily.      bisacodyl 5 MG EC tablet   Commonly known as: DULCOLAX   2 tab at night if no BM      celecoxib 200 MG capsule   Commonly known as: CELEBREX   Take 1 capsule (200 mg total) by mouth daily.      DSS 100 MG Caps   Take 100 mg by mouth 2 (two) times daily.      enoxaparin 30 MG/0.3ML injection   Commonly known as: LOVENOX   Inject 0.3 mLs (30 mg total) into the skin every 12 (twelve) hours.      glyBURIDE 2.5 MG tablet   Commonly known as: DIABETA   Take 2.5 mg by mouth 2 (two) times daily with a meal.      metFORMIN 500 MG tablet   Commonly known as: GLUCOPHAGE   Take 1,000 mg by mouth 2 (two) times daily with a meal.       nitroGLYCERIN 0.4 MG SL tablet   Commonly known as: NITROSTAT   Place 0.4 mg under the tongue every 5 (five) minutes as needed. For chest pain      omeprazole 20 MG capsule   Commonly known as: PRILOSEC   Take 20 mg by mouth daily.      oxyCODONE 5 MG immediate release tablet   Commonly known as: Oxy IR/ROXICODONE   1-2 tab every 4-6 hrs as needed for pain      rosuvastatin 10 MG tablet   Commonly known as: CRESTOR   Take 10 mg by mouth daily.      Tamsulosin HCl 0.4 MG Caps   Commonly known as: FLOMAX   Take 1 capsule (0.4 mg total) by mouth daily.            Diagnostic Studies: Dg Chest 2 View  12/24/2011  *RADIOLOGY REPORT*  Clinical Data: Preoperative assessment for left knee replacement, history hypertension, diabetes, smoking, coronary artery disease post stenting  CHEST - 2 VIEW  Comparison: 02/14/2009  Findings: Upper-normal size of cardiac silhouette. Calcified tortuous aorta. Pulmonary vascularity normal. Lungs clear. No pleural effusion or pneumothorax. Stable extrapleural density lateral lower left chest unchanged since 2010, by prior CT a subpleural lipoma. Scattered end plate spur formation thoracic spine.  IMPRESSION: No acute abnormalities.  Original Report Authenticated By: Burnetta Sabin, M.D.    Disposition:   Discharge Orders    Future Orders Please Complete By Expires   Diet - low sodium heart healthy      Call MD / Call 911      Comments:   If you experience chest pain or shortness of breath, CALL 911 and be transported to the hospital emergency room.  If you develope a fever above 101 F, pus (white drainage) or increased drainage or redness at the wound, or calf pain, call your surgeon's office.   Constipation Prevention      Comments:   Drink plenty of fluids.  Prune juice may be helpful.  You may use a stool softener, such as Colace (over the counter) 100 mg twice a day.  Use MiraLax (over the  counter) for constipation as needed.   Increase activity  slowly as tolerated      Discharge instructions      Comments:   Total Knee Replacement Care After Refer to this sheet in the next few weeks. These discharge instructions provide you with general information on caring for yourself after you leave the hospital. Your caregiver may also give you specific instructions. Your treatment has been planned according to the most current medical practices available, but unavoidable complications sometimes occur. If you have any problems or questions after discharge, please call your caregiver. Regaining a near full range of motion of your knee within the first 3 to 6 weeks after surgery is critical. [place] may resume a normal diet and activities as directed.  Perform exercises as directed.  Place yellow foam block, yellow side up under heel at all times except when in CPM or when walking.  DO NOT modify, tear, cut, or change in any way. You will receive physical therapy daily  Take showers instead of baths until informed otherwise.  Change bandages (dressings)daily Do not take over-the-counter or prescription medicines for pain, discomfort, or fever. Eat a well-balanced diet.  Avoid lifting or driving until you are instructed otherwise.  Make an appointment to see your caregiver for stitches (suture) or staple removal as directed.  If you have been sent home with a continuous passive motion machine (CPM machine), 0-90 degrees 6 hrs a day   2 hrs a shift SEEK MEDICAL CARE IF: You have swelling of your calf or leg.  You develop shortness of breath or chest pain.  You have redness, swelling, or increasing pain in the wound.  There is pus or any unusual drainage coming from the surgical site.  You notice a bad smell coming from the surgical site or dressing.  The surgical site breaks open after sutures or staples have been removed.  There is persistent bleeding from the suture or staple line.  You are getting worse or are not  improving.  You have any other questions or concerns.  SEEK IMMEDIATE MEDICAL CARE IF:  You have a fever.  You develop a rash.  You have difficulty breathing.  You develop any reaction or side effects to medicines given.  Your knee motion is decreasing rather than improving.  MAKE SURE YOU:  Understand these instructions.  Will watch your condition.  Will get help right away if you are not doing well or get worse.   CPM      Comments:   Continuous passive motion machine (CPM):      Use the CPM from 0 to 90 for 6 hours per day.       You may break it up into 2 or 3 sessions per day.      Use CPM for 2 weeks or until you are told to stop.   TED hose      Comments:   Use stockings (TED hose) for 2 weeks on both leg(s).  You may remove them at night for sleeping.   Change dressing      Comments:   Change the dressing daily with sterile 4 x 4 inch gauze dressing and apply TED hose.  You may clean the incision with alcohol prior to redressing.   Do not put a pillow under the knee. Place it under the heel.      Comments:   Place yellow foam block, yellow side up under heel at all times except  when in CPM or when walking.  DO NOT modify, tear, cut, or change in any way the yellow foam block.      Follow-up Information    Follow up with Lorn Junes, MD on 01/14/2012. (appt time 3:45pm)    Contact information:   [facility] [address] [place] [phone]           Signed: Linda Hedges 01/02/2012, 7:39 AM

## 2012-01-02 NOTE — Evaluation (Signed)
Occupational Therapy Evaluation Patient Details Name: Ryan Hunt MRN: [id number] DOB: 25-Apr-1931 Today's Date: 01/02/2012 Time: [id number] OT Time Calculation (min): 30 min  OT Assessment / Plan / Recommendation Clinical Impression  Pt s/p Lt TKA and presents with pain, weakness and overall decreased independence with ADL. Pt will benefit from skilled OT in the acute setting to maximize I with ADL and ADL mobility prior to d/c     OT Assessment  Patient needs continued OT Services    Follow Up Recommendations  Home health OT;Supervision/Assistance - 24 hour    Barriers to Discharge      Equipment Recommendations  None recommended by OT    Recommendations for Other Services    Frequency  Min 2X/week    Precautions / Restrictions Precautions Precautions: Knee Required Braces or Orthoses: Knee Immobilizer - Left Knee Immobilizer - Left: On when out of bed or walking;Discontinue post op day 2 Restrictions LLE Weight Bearing: Weight bearing as tolerated   Pertinent Vitals/Pain Pt reports 8-9/10 left knee pain. RN aware. Pt repositioned    ADL  Eating/Feeding: Simulated;Independent Where Assessed - Eating/Feeding: Chair Grooming: Performed;Supervision/safety Where Assessed - Grooming: Unsupported standing Lower Body Bathing: Simulated;Moderate assistance Where Assessed - Lower Body Bathing: Unsupported sit to stand Lower Body Dressing: Performed;Minimal assistance (AE used) Where Assessed - Lower Body Dressing: Unsupported sit to stand Toilet Transfer: Simulated;Min guard Toilet Transfer Method: Sit to stand Tub/Shower Transfer: Simulated;Minimal assistance Tub/Shower Transfer Method: Ambulating (backward entry) Warden/ranger: Walk in shower Equipment Used: Gait belt;Rolling walker Transfers/Ambulation Related to ADLs: Min guard A with RW ambulation. Pt reports inc pain today as compared to yesterday ADL Comments: wife in room states she can provided  needed assist to husband- "even though it's going to wear me out"    OT Diagnosis: Generalized weakness;Acute pain  OT Problem List: Decreased range of motion;Decreased activity tolerance;Impaired balance (sitting and/or standing);Pain;Increased edema;Decreased knowledge of use of DME or AE OT Treatment Interventions: Self-care/ADL training;DME and/or AE instruction;Patient/family education;Balance training   OT Goals Acute Rehab OT Goals OT Goal Formulation: With patient Time For Goal Achievement: 01/09/12 Potential to Achieve Goals: Good ADL Goals Pt Will Perform Grooming: Independently;Standing at sink ADL Goal: Grooming - Progress: Goal set today Pt Will Perform Upper Body Dressing: with modified independence;Sitting, bed;Sitting, chair ADL Goal: Upper Body Dressing - Progress: Goal set today Pt Will Perform Lower Body Dressing: with min assist;Sit to stand from chair;Sit to stand from bed;with adaptive equipment ADL Goal: Lower Body Dressing - Progress: Goal set today Pt Will Transfer to Toilet: with modified independence;Ambulation;with DME;3-in-1 ADL Goal: Toilet Transfer - Progress: Goal set today Pt Will Perform Toileting - Clothing Manipulation: with modified independence;Standing ADL Goal: Toileting - Clothing Manipulation - Progress: Goal set today Pt Will Perform Tub/Shower Transfer: Shower transfer;Ambulation;with DME (backward entry) ADL Goal: Tub/Shower Transfer - Progress: Goal set today  Visit Information  Last OT Received On: 01/02/12 Assistance Needed: +1    Subjective Data  Subjective: I'm ready to go home Patient Stated Goal: Return home with family   Prior Functioning  Home Living Lives With: Spouse Available Help at Discharge: Family;Available 24 hours/day Type of Home: House Home Access: Stairs to enter [employer] of Steps: 1 Entrance Stairs-Rails: None Home Layout: One level Bathroom Shower/Tub: Multimedia programmer:  Handicapped height Bathroom Accessibility: Yes How Accessible: Accessible via walker Home Adaptive Equipment: Bedside commode/3-in-1;Walker - rolling;Shower chair with back Prior Function Level of Independence: Independent Able to Take Stairs?: Yes  Driving: Yes Vocation: Retired Corporate investment banker: HOH Dominant Hand: Left    Cognition  Overall Cognitive Status: Appears within functional limits for tasks assessed/performed Arousal/Alertness: Awake/alert Orientation Level: Oriented X4 / Intact Behavior During Session: WFL for tasks performed    Extremity/Trunk Assessment Right Upper Extremity Assessment RUE ROM/Strength/Tone: Within functional levels RUE Sensation: WFL - Light Touch RUE Coordination: WFL - gross/fine motor Left Upper Extremity Assessment LUE ROM/Strength/Tone: Within functional levels LUE Sensation: WFL - Light Touch LUE Coordination: WFL - gross/fine motor   Mobility Bed Mobility Bed Mobility: Not assessed Transfers Sit to Stand: 4: Min guard;With armrests;From chair/3-in-1 Stand to Sit: 4: Min guard;With armrests;To chair/3-in-1 Details for Transfer Assistance: pt with difficulty achieving full upright standing. Required inc time secondary to pain   Exercise    Balance Balance Balance Assessed: No  End of Session OT - End of Session Equipment Utilized During Treatment: Gait belt Activity Tolerance: Patient limited by pain Patient left: in chair;with call bell/phone within reach;with family/visitor present Nurse Communication: Mobility status  GO     Siya Flurry 01/02/2012, 3:27 PM

## 2012-01-02 NOTE — Progress Notes (Signed)
PT Progress Note:     01/02/12 1300  PT Visit Information  Last PT Received On 01/02/12  Assistance Needed +1  PT Time Calculation  PT Start Time 1302  PT Stop Time 1317  PT Time Calculation (min) 15 min  Precautions  Precautions Knee  Required Braces or Orthoses Knee Immobilizer - Left  Knee Immobilizer - Left On when out of bed or walking;Discontinue post op day 2  Restrictions  LLE Weight Bearing WBAT  Cognition  Overall Cognitive Status Appears within functional limits for tasks assessed/performed  Arousal/Alertness Awake/alert  Orientation Level Oriented X4 / Intact  Behavior During Session [employer] for tasks performed  Bed Mobility  Bed Mobility Not assessed  Transfers  Transfers Sit to Stand;Stand to Sit  Sit to Stand 4: Min assist;With upper extremity assist;With armrests;From chair/3-in-1  Stand to Sit 4: Min guard;With upper extremity assist;With armrests;To chair/3-in-1  Details for Transfer Assistance (A) to achieve full upright standing & safety.  Cues for use of UE's to control descent.    Ambulation/Gait  Ambulation/Gait Assistance 4: Min guard  Ambulation Distance (Feet) 30 Feet  Assistive device Rolling walker  Ambulation/Gait Assistance Details Cues to stay inside RW to allow for UE support, & tall posture.  Continues to be limited by pain, however pain better controlled since AM session.    Gait Pattern Step-to pattern;Decreased stance time - left;Decreased step length - right;Antalgic;Trunk flexed  Stairs Yes  Stairs Assistance 4: Min assist  Stairs Assistance Details (indicate cue type and reason) Pt performed with backwards technique due to increased ease.  [name] (A)'d with step.  Educated wife on safe handling & assisting pt.    Stair Management Technique No rails;Step to pattern;Backwards;With walker  Number of Stairs 1  (4x's)  Wheelchair Mobility  Wheelchair Mobility No  Balance  Balance Assessed No  PT - End of Session  Equipment Utilized During  Treatment Gait belt  Activity Tolerance Patient limited by pain  Patient left in chair;with call bell/phone within reach;with family/visitor present  PT - Assessment/Plan  Comments on Treatment Session Pt's progression with mobility cont's to be limited by pain this session although has improved since AM.   Focus of session was to ensure pt safe with step to enter home.  Encouraged pt to rest today when he gets home & allow for HHPT to progress mobility.    PT Plan Discharge plan remains appropriate  Follow Up Recommendations Home health PT;Supervision/Assistance - 24 hour  Acute Rehab PT Goals  Time For Goal Achievement 01/08/12  Potential to Achieve Goals Good  PT Goal: Sit to Stand - Progress Not met  PT Goal: Ambulate - Progress Not met  PT Goal: Up/Down Stairs - Progress Progressing toward goal     Pain:  5/10 knee.  Repositioned.   Ryan Hunt, [place] [phone] 01/02/2012

## 2012-01-03 ENCOUNTER — Encounter (HOSPITAL_COMMUNITY): Payer: Self-pay | Admitting: Orthopedic Surgery

## 2012-01-31 ENCOUNTER — Ambulatory Visit: Payer: Medicare Other | Admitting: Physical Therapy

## 2012-02-04 ENCOUNTER — Ambulatory Visit: Payer: Medicare Other | Attending: Neurology | Admitting: Physical Therapy

## 2012-02-04 DIAGNOSIS — M25669 Stiffness of unspecified knee, not elsewhere classified: Secondary | ICD-10-CM | POA: Insufficient documentation

## 2012-02-04 DIAGNOSIS — M25569 Pain in unspecified knee: Secondary | ICD-10-CM | POA: Insufficient documentation

## 2012-02-04 DIAGNOSIS — R5381 Other malaise: Secondary | ICD-10-CM | POA: Insufficient documentation

## 2012-02-04 DIAGNOSIS — IMO0001 Reserved for inherently not codable concepts without codable children: Secondary | ICD-10-CM | POA: Insufficient documentation

## 2012-02-07 ENCOUNTER — Ambulatory Visit: Payer: Medicare Other | Attending: Neurology | Admitting: Physical Therapy

## 2012-02-07 DIAGNOSIS — IMO0001 Reserved for inherently not codable concepts without codable children: Secondary | ICD-10-CM | POA: Insufficient documentation

## 2012-02-07 DIAGNOSIS — Z96659 Presence of unspecified artificial knee joint: Secondary | ICD-10-CM | POA: Insufficient documentation

## 2012-02-07 DIAGNOSIS — R5381 Other malaise: Secondary | ICD-10-CM | POA: Insufficient documentation

## 2012-02-07 DIAGNOSIS — M25669 Stiffness of unspecified knee, not elsewhere classified: Secondary | ICD-10-CM | POA: Insufficient documentation

## 2012-02-07 DIAGNOSIS — M25569 Pain in unspecified knee: Secondary | ICD-10-CM | POA: Insufficient documentation

## 2012-02-12 ENCOUNTER — Ambulatory Visit: Payer: Medicare Other | Admitting: Physical Therapy

## 2012-02-14 ENCOUNTER — Ambulatory Visit: Payer: Medicare Other | Admitting: Physical Therapy

## 2012-02-26 ENCOUNTER — Ambulatory Visit: Payer: Medicare Other | Admitting: Physical Therapy

## 2012-02-29 ENCOUNTER — Ambulatory Visit: Payer: Medicare Other | Admitting: Physical Therapy

## 2012-03-04 ENCOUNTER — Ambulatory Visit: Payer: Medicare Other | Admitting: *Deleted

## 2013-02-18 ENCOUNTER — Encounter: Payer: Self-pay | Admitting: *Deleted

## 2013-02-19 ENCOUNTER — Ambulatory Visit (INDEPENDENT_AMBULATORY_CARE_PROVIDER_SITE_OTHER)
Admission: RE | Admit: 2013-02-19 | Discharge: 2013-02-19 | Disposition: A | Discharge status: Home / Self Care | Payer: Medicare Other | Source: Ambulatory Visit | Attending: Internal Medicine | Admitting: Internal Medicine

## 2013-02-19 ENCOUNTER — Encounter: Payer: Self-pay | Admitting: Internal Medicine

## 2013-02-19 ENCOUNTER — Ambulatory Visit (INDEPENDENT_AMBULATORY_CARE_PROVIDER_SITE_OTHER): Payer: Medicare Other | Admitting: Internal Medicine

## 2013-02-19 VITALS — BP 140/70 | HR 60 | Temp 97.7°F | Ht 68.5 in | Wt 230.0 lb

## 2013-02-19 DIAGNOSIS — R0602 Shortness of breath: Secondary | ICD-10-CM

## 2013-02-19 DIAGNOSIS — I10 Essential (primary) hypertension: Secondary | ICD-10-CM

## 2013-02-19 MED ORDER — OLMESARTAN MEDOXOMIL-HCTZ 20-12.5 MG PO TABS: 1.0000 | ORAL_TABLET | Freq: Every day | ORAL | Status: DC

## 2013-02-19 NOTE — Progress Notes (Signed)
  Subjective:    Patient ID: Ryan Hunt, male    DOB: [birth year]  MRN: [id number]  HPI  60 yowm quit smoking 1973 no trouble at all with cough or sob referred by Dr Linard Millers for new doe onset 11/2012  02/19/2013 1st pulmonary eval/ Ryan Hunt [facility] indolent doe x 3 months really notice walking back from [place] and sits and rest when arrives back home, not noting change with bicycle ergometry. Assoc with marked hoarsness , does have need for ppi once or twice weekly for overt hb but no direct relation to sob and no sign cough, no better p albuterol   No obvious daytime variabilty or assoc excess or purulent mucus or cp or chest tightness, subjective wheeze overt sinus or hb symptoms. No unusual exp hx or h/o childhood pna/ asthma or knowledge of premature birth.   Sleeping ok without nocturnal  or early am exacerbation  of respiratory  c/o's or need for noct saba. Also denies any obvious fluctuation of symptoms with weather or environmental changes or other aggravating or alleviating factors except as outlined above     Review of Systems  Constitutional: Negative for fever, chills, activity change, appetite change and unexpected weight change.  HENT: Negative for congestion, sore throat, rhinorrhea, sneezing, trouble swallowing, dental problem, voice change and postnasal drip.   Eyes: Negative for visual disturbance.  Respiratory: Positive for shortness of breath. Negative for cough and choking.   Cardiovascular: Negative for chest pain and leg swelling.  Gastrointestinal: Negative for nausea, vomiting and abdominal pain.  Genitourinary: Negative for difficulty urinating.  Musculoskeletal: Positive for arthralgias.  Skin: Negative for rash.  Psychiatric/Behavioral: Negative for behavioral problems and confusion.       Objective:   Physical Exam  Very hoarse amb wm nad with very prominent pseudowheeze Wt Readings from Last 3 Encounters:  02/19/13 230 lb (104.327 kg)  12/24/11 225 lb 5 oz  (102.201 kg)  12/17/11 225 lb (102.059 kg)    HEENT mild turbinate edema.  Oropharynx edentulous, dentures in place no thrush or excess pnd or cobblestoning.  No JVD or cervical adenopathy. Mild accessory muscle hypertrophy. Trachea midline, nl thryroid. Chest was slt hyperinflated by percussion with diminished breath sounds and min increased exp time without wheeze. Hoover sign positive at end inspiration. Regular rate and rhythm without murmur gallop or rub or increase P2 or edema.  Abd: no hsm, nl excursion. Ext warm without cyanosis or clubbing.    CXR  02/19/2013 :   No active disease. No significant change.      Assessment & Plan:

## 2013-02-19 NOTE — Assessment & Plan Note (Signed)
Symptoms are markedly disproportionate to objective findings and not clear this is a lung problem but pt does appear to have difficult airway management issues. DDX of  difficult airways managment all start with A and  include Adherence, Ace Inhibitors, Acid Reflux, Active Sinus Disease, Alpha 1 Antitripsin deficiency, Anxiety masquerading as Airways dz,  ABPA,  allergy(esp in young), Aspiration (esp in elderly), Adverse effects of DPI,  Active smokers, plus two Bs  = Bronchiectasis and Beta blocker use..and one C= CHF  acei at top of list of suspects despite absence of classic cough  Acid reflux frequently serves as the "gasolline" provided by the "oxygen" in the acei , ie synergistic destabilization of the upper airway.  See instructions for specific recommendations which were reviewed directly with the patient who was given a copy with highlighter outlining the key components.   Need f/u pft's in 6 weeks

## 2013-02-19 NOTE — Patient Instructions (Addendum)
Stop benazapril Start benicar 20/12.5 one daily Take omeprazole 20 mg Take 30-60 min before first meal of the day   Please schedule a follow up office visit in 6 weeks, call sooner if needed with pfts

## 2013-02-19 NOTE — Assessment & Plan Note (Signed)
ACE inhibitors are problematic in  pts with airway complaints because  even experienced pulmonologists can't always distinguish ace effects from copd/asthma.  By themselves they don't actually cause a problem, much like oxygen can't by itself start a fire, but they certainly serve as a powerful catalyst or enhancer for any "fire"  or inflammatory process in the upper airway, be it caused by an ET  tube or more commonly reflux (especially in the obese or pts with known GERD or who are on biphoshonates).    In the era of ARB near equivalency, at least in short term,  until we have a better handle on the reversibility of the airway problem, it just makes sense to avoid ACEI  entirely in the short run and then decide later, having established a level of airway control using a reasonable limited regimen, whether to add back ace but even then being very careful to observe the pt for worsening airway control and number of meds used/ needed to control symptoms.    Will try benicar 20/12.5 one daily x 6 week trial

## 2013-02-20 ENCOUNTER — Telehealth: Payer: Self-pay | Admitting: Internal Medicine

## 2013-02-20 NOTE — Telephone Encounter (Signed)
Result Note    Call pt: Reviewed cxr and no acute change so no change in recommendations made at [facility]

## 2013-02-20 NOTE — Telephone Encounter (Signed)
Pt returned call. Ryan Hunt °

## 2013-02-20 NOTE — Progress Notes (Signed)
Quick Note:  Spoke with patient-- Made him aware of results as listed below.  Pt verbalized understanding Nothing further needed at this time ______

## 2013-02-20 NOTE — Telephone Encounter (Signed)
Spoke with patient-- Made him aware of results as listed below.  Pt verbalized understanding Nothing further needed at this time

## 2013-03-03 ENCOUNTER — Encounter (INDEPENDENT_AMBULATORY_CARE_PROVIDER_SITE_OTHER): Payer: Medicare Other | Admitting: Ophthalmology

## 2013-03-03 DIAGNOSIS — H43819 Vitreous degeneration, unspecified eye: Secondary | ICD-10-CM

## 2013-03-03 DIAGNOSIS — H35039 Hypertensive retinopathy, unspecified eye: Secondary | ICD-10-CM

## 2013-03-03 DIAGNOSIS — I10 Essential (primary) hypertension: Secondary | ICD-10-CM

## 2013-03-03 DIAGNOSIS — H35379 Puckering of macula, unspecified eye: Secondary | ICD-10-CM

## 2013-03-03 DIAGNOSIS — E11319 Type 2 diabetes mellitus with unspecified diabetic retinopathy without macular edema: Secondary | ICD-10-CM

## 2013-03-03 DIAGNOSIS — E1139 Type 2 diabetes mellitus with other diabetic ophthalmic complication: Secondary | ICD-10-CM

## 2013-04-02 ENCOUNTER — Encounter: Payer: Self-pay | Admitting: Internal Medicine

## 2013-04-02 ENCOUNTER — Other Ambulatory Visit (INDEPENDENT_AMBULATORY_CARE_PROVIDER_SITE_OTHER): Payer: Medicare Other

## 2013-04-02 ENCOUNTER — Ambulatory Visit (INDEPENDENT_AMBULATORY_CARE_PROVIDER_SITE_OTHER): Payer: Medicare Other | Admitting: Internal Medicine

## 2013-04-02 VITALS — BP 140/60 | HR 60 | Temp 97.8°F | Ht 67.5 in | Wt 230.0 lb

## 2013-04-02 DIAGNOSIS — R0602 Shortness of breath: Secondary | ICD-10-CM

## 2013-04-02 DIAGNOSIS — I10 Essential (primary) hypertension: Secondary | ICD-10-CM

## 2013-04-02 LAB — CBC WITH DIFFERENTIAL/PLATELET
Basophils Absolute: 0 10*3/uL (ref 0.0–0.1)
Eosinophils Relative: 1.6 % (ref 0.0–5.0)
HCT: 40.1 % (ref 39.0–52.0)
Hemoglobin: 13.7 g/dL (ref 13.0–17.0)
Lymphs Abs: 1.5 10*3/uL (ref 0.7–4.0)
MCV: 89.4 fl (ref 78.0–100.0)
Monocytes Absolute: 0.5 10*3/uL (ref 0.1–1.0)
Monocytes Relative: 9.9 % (ref 3.0–12.0)
Neutro Abs: 3.2 10*3/uL (ref 1.4–7.7)
RDW: 14.5 % (ref 11.5–14.6)

## 2013-04-02 LAB — BRAIN NATRIURETIC PEPTIDE: Pro B Natriuretic peptide (BNP): 75 pg/mL (ref 0.0–100.0)

## 2013-04-02 LAB — BASIC METABOLIC PANEL
BUN: 19 mg/dL (ref 6–23)
CO2: 27 mEq/L (ref 19–32)
Chloride: 107 mEq/L (ref 96–112)
Creatinine, Ser: 1.2 mg/dL (ref 0.4–1.5)
Potassium: 4.8 mEq/L (ref 3.5–5.1)

## 2013-04-02 NOTE — Patient Instructions (Addendum)
Continue benicar but one half daily  Try prilosec(omeprazole)  20mg   Take 30-60 min before first meal of the day and Pepcid 20 mg one bedtime until you return  GERD (REFLUX)  is an extremely common cause of respiratory symptoms, many times with no significant heartburn at all.    It can be treated with medication, but also with lifestyle changes including avoidance of late meals, excessive alcohol, smoking cessation, and avoid fatty foods, chocolate, peppermint, colas, red wine, and acidic juices such as orange juice.  NO MINT OR MENTHOL PRODUCTS SO NO COUGH DROPS  USE SUGARLESS CANDY INSTEAD (jolley ranchers or Stover's)  NO OIL BASED VITAMINS - use powdered substitutes.  Schedule pft's asap  Please remember to go to the lab department downstairs for your tests - we will call you with the results when they are available.  Please schedule a follow up office visit in 4 weeks, sooner if needed

## 2013-04-02 NOTE — Assessment & Plan Note (Signed)
-   04/02/2013  Walked RA x 3 laps @ 185 ft each stopped due to end of study, no desats  Symptoms are markedly disproportionate to objective findings and not clear this is a lung problem but pt does appear to have difficult airway management issues.  DDX of  difficult airways managment all start with A and  include Adherence, Ace Inhibitors, Acid Reflux, Active Sinus Disease, Alpha 1 Antitripsin deficiency, Anxiety masquerading as Airways dz,  ABPA,  allergy(esp in young), Aspiration (esp in elderly), Adverse effects of DPI,  Active smokers, plus two Bs  = Bronchiectasis and Beta blocker use..and one C= CHF   ACEi > off since 02/19/13 and still with prominent pseudowheeze > pft's next  ? Acid (or non-acid) GERD > always difficult to exclude as up to 75% of pts in some series report no assoc GI/ Heartburn symptoms> rec max (24h)  acid suppression and diet restrictions/ reviewed and instructions given in writting   Anxiety dx of exclusion though the absence of symptoms with bicycle ergometry are suggestive (in absence of sudden wt gain) of a functional component here.

## 2013-04-02 NOTE — Progress Notes (Signed)
Quick Note:  Spoke with pt and notified of results per Dr. Wert. Pt verbalized understanding and denied any questions.  ______ 

## 2013-04-02 NOTE — Assessment & Plan Note (Signed)
Change acei to arb 02/19/2013 due to pseudowheeze > not better yet so continue off acei and max gerd rx

## 2013-04-02 NOTE — Progress Notes (Signed)
  Subjective:    Patient ID: Ryan Hunt, male    DOB: 05-Sep-1930  MRN: [id number]   Brief patient profile:  48 yowm quit smoking 1973 no trouble at all with cough or sob referred by Ryan Hunt for new doe onset 11/2012   History of Present Illness  02/19/2013 1st pulmonary eval/ Ryan Hunt cc indolent doe x 3 months really notice walking back from [place] and sits and rest when arrives back home, not noting change with bicycle ergometry. Assoc with marked hoarsness , does have need for ppi once or twice weekly for overt hb but no direct relation to sob and no sign cough, no better p albuterol. rec Stop benazapril Start benicar 20/12.5 one daily Take omeprazole 20 mg Take 30-60 min before first meal of the day   04/02/2013 f/u ov/Ryan Hunt re: sob/ hoarseness Chief Complaint  Patient presents with  . Follow-up    Pt states "if anything, the breathing is worse". No new co's today.    still reporting no problem with bicycle but can't walk 200 ft to mailbox s stopping to rest    No obvious daytime variabilty or assoc excess or purulent mucus or cp or chest tightness, subjective wheeze overt sinus or hb symptoms. No unusual exp hx or h/o childhood pna/ asthma or knowledge of premature birth.   Sleeping ok without nocturnal  or early am exacerbation  of respiratory  c/o's or need for noct saba. Also denies any obvious fluctuation of symptoms with weather or environmental changes or other aggravating or alleviating factors except as outlined above          Objective:   Physical Exam  Very hoarse amb wm nad with very prominent pseudowheeze   Wt Readings from Last 3 Encounters:  04/02/13 230 lb (104.327 kg)  02/19/13 230 lb (104.327 kg)  12/24/11 225 lb 5 oz (102.201 kg)       HEENT mild turbinate edema.  Oropharynx edentulous, dentures in place no thrush or excess pnd or cobblestoning.  No JVD or cervical adenopathy. Mild accessory muscle hypertrophy. Trachea midline, nl thryroid. Chest was slt  hyperinflated by percussion with diminished breath sounds and min increased exp time without wheeze. Hoover sign positive at end inspiration. Regular rate and rhythm without murmur gallop or rub or increase P2  Pos pitting bilateral edema.  Abd: no hsm, nl excursion. Ext warm without cyanosis or clubbing.    CXR  02/19/2013 :   No active disease. No significant change.  04/02/2013 all labs nl including tsh, bnp     Assessment & Plan:

## 2013-04-17 ENCOUNTER — Ambulatory Visit (INDEPENDENT_AMBULATORY_CARE_PROVIDER_SITE_OTHER): Payer: Medicare Other | Admitting: Internal Medicine

## 2013-04-17 DIAGNOSIS — R0602 Shortness of breath: Secondary | ICD-10-CM

## 2013-04-17 LAB — PULMONARY FUNCTION TEST

## 2013-04-17 NOTE — Progress Notes (Signed)
PFT done today. 

## 2013-04-20 ENCOUNTER — Encounter: Payer: Self-pay | Admitting: Internal Medicine

## 2013-05-01 ENCOUNTER — Ambulatory Visit (INDEPENDENT_AMBULATORY_CARE_PROVIDER_SITE_OTHER): Payer: Medicare Other | Admitting: Internal Medicine

## 2013-05-01 ENCOUNTER — Encounter: Payer: Self-pay | Admitting: Internal Medicine

## 2013-05-01 VITALS — BP 118/62 | HR 60 | Temp 97.6°F | Ht 67.5 in | Wt 230.6 lb

## 2013-05-01 DIAGNOSIS — R0602 Shortness of breath: Secondary | ICD-10-CM

## 2013-05-01 DIAGNOSIS — I10 Essential (primary) hypertension: Secondary | ICD-10-CM

## 2013-05-01 NOTE — Patient Instructions (Signed)
Your lung function is completely normal but your weight appears to be affecting your breathing when you walk more than when you bicycle.  Weight control is simply a matter of calorie balance which needs to be tilted in your favor by eating less and exercising more.  To get the most out of exercise, you need to be continuously aware that you are short of breath, but never out of breath, for 30 minutes daily. As you improve, it will actually be easier for you to do the same amount of exercise  in  30 minutes so always push to the level where you are short of breath.  If this does not result in gradual weight reduction then I strongly recommend you see a nutritionist with a food diary x 2 weeks so that we can work out a negative calorie balance which is universally effective in steady weight loss programs.  Think of your calorie balance like you do your bank account where in this case you want the balance to go down so you must take in less calories than you burn up.  It's just that simple:  Hard to do, but easy to understand.     Because of your leg swelling I would consider stopping the norvasc about a week before your next appointment with [facility]   No regular pulmonary follow up is needed - Good luck!

## 2013-05-01 NOTE — Progress Notes (Signed)
Subjective:    Patient ID: Ryan Hunt, male    DOB: 27-Nov-1930    MRN: [id number]   Brief patient profile:  58 yowm quit smoking 1973 no trouble at all at wt 145 with cough or sob referred by Dr Linard Millers for new doe onset 11/2012   History of Present Illness  02/19/2013 1st pulmonary eval/ [name] cc indolent doe x 3 months really notice walking back from [place] and sits and rest when arrives back home, not noting change with bicycle ergometry. Assoc with marked hoarsness , does have need for ppi once or twice weekly for overt hb but no direct relation to sob and no sign cough, no better p albuterol. rec Stop benazapril Start benicar 20/12.5 one daily Take omeprazole 20 mg Take 30-60 min before first meal of the day   04/02/2013 f/u ov/[name] re: sob/ hoarseness Chief Complaint  Patient presents with  . Follow-up    Pt states "if anything, the breathing is worse". No new co's today.    still reporting no problem with bicycle but can't walk 200 ft to mailbox s stopping to rest (uphill)  rec Continue benicar but one half daily Try prilosec(omeprazole)  20mg   Take 30-60 min before first meal of the day and Pepcid 20 mg one bedtime until you return GERD  Diet   05/01/2013 f/u ov/[name] re: doe  Chief Complaint  Patient presents with  . Follow-up    Breathing unchanged since the last visit. No new co's today.     Did fine walking all around [place] in [place] which included some inclines  No obvious day to day or daytime variabilty or assoc chronic cough or cp or chest tightness, subjective wheeze overt sinus or hb symptoms. No unusual exp hx or h/o childhood pna/ asthma or knowledge of premature birth.  Sleeping ok without nocturnal  or early am exacerbation  of respiratory  c/o's or need for noct saba. Also denies any obvious fluctuation of symptoms with weather or environmental changes or other aggravating or alleviating factors except as outlined above   Current Medications, Allergies,  Complete Past Medical History, Past Surgical History, Family History, and Social History were reviewed in [employer] record.  ROS  The following are not active complaints unless bolded sore throat, dysphagia, dental problems, itching, sneezing,  nasal congestion or excess/ purulent secretions, ear ache,   fever, chills, sweats, unintended wt loss, pleuritic or exertional cp, hemoptysis,  orthopnea pnd or leg swelling, presyncope, palpitations, heartburn, abdominal pain, anorexia, nausea, vomiting, diarrhea  or change in bowel or urinary habits, change in stools or urine, dysuria,hematuria,  rash, arthralgias, visual complaints, headache, numbness weakness or ataxia or problems with walking or coordination,  change in mood/affect or memory.                   Objective:   Physical Exam  Min  hoarse amb wm nad no longer with  pseudowheeze  05/01/2013       230  Wt Readings from Last 3 Encounters:  04/02/13 230 lb (104.327 kg)  02/19/13 230 lb (104.327 kg)  12/24/11 225 lb 5 oz (102.201 kg)       HEENT mild turbinate edema.  Oropharynx edentulous, dentures in place no thrush or excess pnd or cobblestoning.  No JVD or cervical adenopathy. Mild accessory muscle hypertrophy. Trachea midline, nl thryroid. Chest was slt hyperinflated by percussion with diminished breath sounds and min increased exp time without wheeze. Melanee Left  sign positive at end inspiration. Regular rate and rhythm without murmur gallop or rub or increase P2  Pos pitting bilateral edema.  Abd: no hsm, nl excursion. Ext warm without cyanosis or clubbing.      CXR  02/19/2013 :  No active disease. No significant change.  04/02/2013 all labs nl including tsh, bnp     Assessment & Plan:   Outpatient Encounter Prescriptions as of 05/01/2013  Medication Sig Dispense Refill  . amLODipine (NORVASC) 5 MG tablet Take 5 mg by mouth daily.      Marland Kitchen aspirin 325 MG tablet Take 325 mg by mouth daily.      Marland Kitchen  glipiZIDE (GLUCOTROL XL) 2.5 MG 24 hr tablet Take 2.5 mg by mouth 2 (two) times daily.      . metFORMIN (GLUCOPHAGE) 500 MG tablet Take 500 mg by mouth 2 (two) times daily with a meal.       . naproxen sodium (ALEVE) 220 MG tablet Take 220 mg by mouth as needed.      . nitroGLYCERIN (NITROSTAT) 0.4 MG SL tablet Place 0.4 mg under the tongue every 5 (five) minutes as needed. For chest pain      . olmesartan-hydrochlorothiazide (BENICAR HCT) 20-12.5 MG per tablet Take 0.5 tablets by mouth daily.      Marland Kitchen omeprazole (PRILOSEC) 20 MG capsule Take 20 mg by mouth daily.      . rosuvastatin (CRESTOR) 20 MG tablet Take 20 mg by mouth daily.      Marland Kitchen spironolactone (ALDACTONE) 25 MG tablet Take 25 mg by mouth daily.       No facility-administered encounter medications on file as of 05/01/2013.

## 2013-05-03 NOTE — Assessment & Plan Note (Signed)
-   04/02/2013  Walked RA x 3 laps @ 185 ft each stopped due to end of study, no desats - PFT's  04/17/13  wnl    I had an extended summary  discussion with the patient today lasting 15 to 20 minutes of a 25 minute visit on the following issues:   Since he has no problem with bicycle ergometry and [place] I think he's much better than first eval though he's hard to convince of this.  All his pseudoasthma has resolved off ace > see HBP  Reviewed reconditioning/ wt loss which is really all that's needed at this point, with pulmonary f/u prn

## 2013-05-03 NOTE — Assessment & Plan Note (Signed)
Change acei to arb 02/19/2013 due to pseudowheeze> resolved  Would not rechallenge with acei in this setting  Note sign bilateral lower ext edema > on amlodipine and worth considering alternative rx but deferred until his next ov with Dr Delfina Redwood

## 2013-05-07 ENCOUNTER — Encounter: Payer: Self-pay | Admitting: Internal Medicine

## 2013-05-18 ENCOUNTER — Encounter: Payer: Self-pay | Admitting: Interventional Cardiology

## 2013-05-18 ENCOUNTER — Encounter: Payer: Self-pay | Admitting: Internal Medicine

## 2013-07-30 ENCOUNTER — Other Ambulatory Visit: Payer: Self-pay | Admitting: Internal Medicine

## 2013-08-05 ENCOUNTER — Encounter: Payer: Self-pay | Admitting: *Deleted

## 2013-08-13 ENCOUNTER — Ambulatory Visit: Payer: Medicare Other | Admitting: Family Medicine

## 2013-08-14 ENCOUNTER — Ambulatory Visit: Payer: Medicare Other | Admitting: Family Medicine

## 2013-08-19 ENCOUNTER — Ambulatory Visit: Payer: Medicare Other | Admitting: Interventional Cardiology

## 2013-08-20 ENCOUNTER — Ambulatory Visit (INDEPENDENT_AMBULATORY_CARE_PROVIDER_SITE_OTHER): Payer: Medicare HMO | Admitting: Interventional Cardiology

## 2013-08-20 ENCOUNTER — Encounter: Payer: Self-pay | Admitting: Interventional Cardiology

## 2013-08-20 VITALS — BP 138/56 | HR 56 | Ht 67.5 in | Wt 225.0 lb

## 2013-08-20 DIAGNOSIS — E1169 Type 2 diabetes mellitus with other specified complication: Secondary | ICD-10-CM

## 2013-08-20 DIAGNOSIS — I10 Essential (primary) hypertension: Secondary | ICD-10-CM

## 2013-08-20 DIAGNOSIS — R079 Chest pain, unspecified: Secondary | ICD-10-CM

## 2013-08-20 DIAGNOSIS — E669 Obesity, unspecified: Secondary | ICD-10-CM

## 2013-08-20 DIAGNOSIS — I2510 Atherosclerotic heart disease of native coronary artery without angina pectoris: Secondary | ICD-10-CM

## 2013-08-20 DIAGNOSIS — E119 Type 2 diabetes mellitus without complications: Secondary | ICD-10-CM

## 2013-08-20 NOTE — Patient Instructions (Signed)
Your physician has requested that you have a lexiscan myoview. For further information please visit [email or web address]. Please follow instruction sheet, as given.  Your physician recommends that you continue on your current medications as directed. Please refer to the Current Medication list given to you today.  Your physician wants you to follow-up in: 6 months with DR. Gaspar Bidding will receive a reminder letter in the mail two months in advance. If you don't receive a letter, please call our office to schedule the follow-up appointment.

## 2013-08-20 NOTE — Progress Notes (Signed)
Patient ID: Ryan Hunt, male   DOB: February 24, 1931, 80 y.o.   MRN: [id number]    [address] [place], [place]  [address] Phone: [phone] Fax:  [phone]  Date:  08/20/2013   ID:  Ryan Hunt, DOB 1931-02-22, MRN [id number]  PCP:  Kandice Hams, MD   ASSESSMENT:  1. Coronary artery disease, with prior circumflex stenting, 2007 2. Recent severe episode of chest pain involving left precordium radiating to her shoulder back and into the left arm. The episode lasted 2 hours. It is associated with mild dyspnea. He gradually resolved. Today's EKG is unchanged and appears normal 3. Blood pressure is under good control 4. Diabetes mellitus 5. Chronic dyspnea without obvious evidence of heart failure or lung disease  PLAN:  1. Lexiscan myocardial perfusion study to rule out high risk abnormality 2. Continue medications as listed per the severe pain recurs go immediately to the emergency room 3. Clinical followup in 6 months or earlier if problems   SUBJECTIVE: Ryan Hunt is a 80 y.o. male who has done well since I saw him last approximately 6 months ago. Last evening he experienced an episode of chest discomfort that started in the left chest and radiated to the shoulder and into his back. The discomfort was significant it lasted 2 hours (3 PM to 5 PM) before resolving. This was a new type of discomfort. He feels fine today. He had no difficulty sleeping last evening. He denies any change in his chronic dyspnea. Dyspnea has been evaluated by Dr. Melvyn Novas and no abnormalities were found. She denies edema. He denies orthopnea.   Wt Readings from Last 3 Encounters:  08/20/13 225 lb (102.059 kg)  05/01/13 230 lb 9.6 oz (104.599 kg)  04/02/13 230 lb (104.327 kg)     Past Medical History  Diagnosis Date  . Hypertension   . Bronchitis 11/2011    "first time for me"  . GERD (gastroesophageal reflux disease)   . Exertional dyspnea     Chronic  . Type II diabetes  mellitus   . Kidney stone     "dr told me my kidney was loaded w/stones; imbedded"  . Arthritis     "knees"  . Diverticulosis of colon (without mention of hemorrhage)   . Labyrinthitis   . Osteoarthrosis, unspecified whether generalized or localized, lower leg   . Hypercholesterolemia   . Mixed hyperlipidemia   . Diastolic heart failure   . Leg edema   . Diabetes mellitus with kidney disease   . Coronary atherosclerosis of native coronary artery   . CAD (coronary artery disease)     With DES circumflex 02/2009  . Palpitations   . Prostate cancer     s/p prostatectomy  . History of peptic ulcer disease     Remote history  . Meralgia paresthetica     Current Outpatient Prescriptions  Medication Sig Dispense Refill  . amLODipine (NORVASC) 5 MG tablet Take 5 mg by mouth daily.      Marland Kitchen aspirin 325 MG tablet Take 325 mg by mouth daily.      Marland Kitchen glipiZIDE (GLUCOTROL XL) 2.5 MG 24 hr tablet Take 2.5 mg by mouth 2 (two) times daily.      . metFORMIN (GLUCOPHAGE) 500 MG tablet Take 500 mg by mouth 2 (two) times daily with a meal.       . naproxen sodium (ALEVE) 220 MG tablet Take 220 mg by mouth as needed.      Marland Kitchen  nitroGLYCERIN (NITROSTAT) 0.4 MG SL tablet Place 0.4 mg under the tongue every 5 (five) minutes as needed. For chest pain      . olmesartan-hydrochlorothiazide (BENICAR HCT) 20-12.5 MG per tablet Take 0.5 tablets by mouth daily.      Marland Kitchen omeprazole (PRILOSEC) 20 MG capsule Take 20 mg by mouth daily.      . rosuvastatin (CRESTOR) 20 MG tablet Take 20 mg by mouth daily.      Marland Kitchen spironolactone (ALDACTONE) 25 MG tablet Take 25 mg by mouth daily.       No current facility-administered medications for this visit.    Allergies:    Allergies  Allergen Reactions  . Amoxicillin Hives and Other (See Comments)    "sores in my mouth"  . Oxycodone Hcl     agitation  . Simvastatin     itching    Social History:  The patient  reports that he quit smoking about 40 years ago. His smoking use  included Cigarettes. He has a 7.5 pack-year smoking history. He has never used smokeless tobacco. He reports that he drinks alcohol. He reports that he does not use illicit drugs.   ROS:  Please see the history of present illness.   Stable appetite. No medication compliance issues. Denies dietary indiscretion   All other systems reviewed and negative.   OBJECTIVE: VS:  BP 138/56  Pulse 56  Ht 5' 7.5" (1.715 m)  Wt 225 lb (102.059 kg)  BMI 34.70 kg/m2 Well nourished, well developed, in no acute distress, elderly obese HEENT: normal Neck: JVD flat. Carotid bruit absent  Cardiac:  normal S1, S2; RRR; no murmur Lungs:  clear to auscultation bilaterally, no wheezing, rhonchi or rales Abd: soft, nontender, no hepatomegaly Ext: Edema  Absent . Pulses 2+ bilateral  Skin: warm and dry Neuro:  CNs 2-12 intact, no focal abnormalities noted  EKG:  Sinus bradycardia      Medical History: CAD with DES circumflex 02/2009, GERD, Meralgia paresthetica, Palpitations, Prostate cancer s/p prostatectomy, Hypercholesterolemia, Hypertension, Remote history of peptic ulcer disease, ASPVD, DJD, Kidney stones, Diverticulosis , Labyrinthitis, Diabetes 2, Controlled, Obesity, morbid.    Signed, Illene Labrador III, MD 08/20/2013 4:01 PM

## 2013-09-08 ENCOUNTER — Ambulatory Visit (HOSPITAL_COMMUNITY): Payer: Medicare HMO | Attending: Interventional Cardiology | Admitting: Radiology

## 2013-09-08 VITALS — BP 145/67 | Ht 67.5 in | Wt 223.0 lb

## 2013-09-08 DIAGNOSIS — Z87891 Personal history of nicotine dependence: Secondary | ICD-10-CM | POA: Insufficient documentation

## 2013-09-08 DIAGNOSIS — I10 Essential (primary) hypertension: Secondary | ICD-10-CM | POA: Insufficient documentation

## 2013-09-08 DIAGNOSIS — I2510 Atherosclerotic heart disease of native coronary artery without angina pectoris: Secondary | ICD-10-CM | POA: Insufficient documentation

## 2013-09-08 DIAGNOSIS — R079 Chest pain, unspecified: Secondary | ICD-10-CM | POA: Insufficient documentation

## 2013-09-08 DIAGNOSIS — R0989 Other specified symptoms and signs involving the circulatory and respiratory systems: Secondary | ICD-10-CM | POA: Insufficient documentation

## 2013-09-08 DIAGNOSIS — E119 Type 2 diabetes mellitus without complications: Secondary | ICD-10-CM | POA: Insufficient documentation

## 2013-09-08 DIAGNOSIS — I739 Peripheral vascular disease, unspecified: Secondary | ICD-10-CM | POA: Insufficient documentation

## 2013-09-08 DIAGNOSIS — R002 Palpitations: Secondary | ICD-10-CM | POA: Insufficient documentation

## 2013-09-08 DIAGNOSIS — R0609 Other forms of dyspnea: Secondary | ICD-10-CM | POA: Insufficient documentation

## 2013-09-08 DIAGNOSIS — R0602 Shortness of breath: Secondary | ICD-10-CM | POA: Insufficient documentation

## 2013-09-08 DIAGNOSIS — E785 Hyperlipidemia, unspecified: Secondary | ICD-10-CM | POA: Insufficient documentation

## 2013-09-08 MED ORDER — TECHNETIUM TC 99M SESTAMIBI GENERIC - CARDIOLITE
30.0000 | Freq: Once | INTRAVENOUS | Status: AC | PRN
  Administered 2013-09-08: 30 via INTRAVENOUS

## 2013-09-08 MED ORDER — REGADENOSON 0.4 MG/5ML IV SOLN
0.4000 mg | Freq: Once | INTRAVENOUS | Status: AC
  Administered 2013-09-08: 0.4 mg via INTRAVENOUS

## 2013-09-08 MED ORDER — TECHNETIUM TC 99M SESTAMIBI GENERIC - CARDIOLITE
10.0000 | Freq: Once | INTRAVENOUS | Status: AC | PRN
  Administered 2013-09-08: 10 via INTRAVENOUS

## 2013-09-08 NOTE — Progress Notes (Signed)
[facility] [address] [place], [place] [address] [phone]    Cardiology Nuclear Med Study  Ryan Hunt is a 80 y.o. male     MRN : [id number]     DOB: [birth year]  Procedure Date: 09/08/2013  Nuclear Med Background Indication for Stress Test:  Evaluation for Ischemia and Stent Patency History:  CAD, 08/10 Cath-Stent-CFX 11/18/12 2 yrs ago MPI: NL EF: 74%, CHF Cardiac Risk Factors: History of Smoking, Hypertension, Lipids, NIDDM and PVD  Symptoms:  Chest Pain, DOE, Palpitations and SOB   Nuclear Pre-Procedure Caffeine/Decaff Intake:  None > 12 hrs NPO After: 10:30pm   Lungs:  clear O2 Sat: 96% on room air. IV 0.9% NS with Angio Cath:  22g  IV Site: R Antecubital x 1, tolerated well IV Started by:  Irven Baltimore, RN  Chest Size (in):  46 Cup Size: n/a  Height: 5' 7.5" (1.715 m)  Weight:  223 lb (101.152 kg)  BMI:  Body mass index is 34.39 kg/(m^2). Tech Comments:  Held Glipizide and Metformin this am.    Nuclear Med Study 1 or 2 day study: 1 day  Stress Test Type:  Treadmill/Lexiscan  Reading MD: N/A  Order Authorizing Provider:  Daneen Schick, III  Resting Radionuclide: Technetium 57m Sestamibi  Resting Radionuclide Dose: 11.0 mCi   Stress Radionuclide:  Technetium 58m Sestamibi  Stress Radionuclide Dose: 33.0 mCi           Stress Protocol Rest HR: 47 Stress HR: 90  Rest BP: 145/67 Stress BP: 168/70  Exercise Time (min): n/a METS: n/a   Predicted Max HR: 139 bpm % Max HR: 64.75 bpm Rate Pressure Product: 15120   Dose of Adenosine (mg):  n/a Dose of Lexiscan: 0.4 mg  Dose of Atropine (mg): n/a Dose of Dobutamine: n/a mcg/kg/min (at max HR)  Stress Test Technologist: Perrin Maltese, EMT-P  Nuclear Technologist:  Charlton Amor, CNMT     Rest Procedure:  Myocardial perfusion imaging was performed at rest 45 minutes following the intravenous administration of Technetium 16m Sestamibi. Rest ECG: NSR with non-specific ST-T wave  changes and sinus bradycardia  Stress Procedure:  The patient received IV Lexiscan 0.4 mg over 15-seconds with concurrent low level exercise and then Technetium 80m Sestamibi was injected at 30-seconds while the patient continued walking one more minute. This patient was sob and lt. Headed with the Lexiscan injection. Quantitative spect images were obtained after a 45-minute delay. Stress ECG: No significant change from baseline ECG  QPS Raw Data Images:  Mild diaphragmatic attenuation.  Normal left ventricular size. Stress Images:  There is decreased uptake in the inferior wall. Rest Images:  There is decreased uptake in the inferior wall. Subtraction (SDS):  There is a fixed inferior defect that is most consistent with diaphragmatic attenuation. Transient Ischemic Dilatation (Normal <1.22):  1.01 Lung/Heart Ratio (Normal <0.45):  0.22  Quantitative Gated Spect Images QGS EDV:  86 ml QGS ESV:  26 ml  Impression Exercise Capacity:  Lexiscan with low level exercise. BP Response:  Normal blood pressure response. Clinical Symptoms:  There is dyspnea. ECG Impression:  No significant ST segment change suggestive of ischemia. Comparison with Prior Nuclear Study: No images to compare  Overall Impression:  Low risk stress nuclear study fixed defect in the inferior wall consistent with diaphragmatic attenuation.  No inducible ischemia..  LV Ejection Fraction: 70%.  LV Wall Motion:  NL LV Function; NL Wall Motion   Signed: Fransico Him, MD  09/08/2013       

## 2013-09-17 ENCOUNTER — Telehealth: Payer: Self-pay

## 2013-09-17 NOTE — Telephone Encounter (Signed)
pt given nuclear study results.Low risk study with no evidence of decreased blood flow.pt verbalized understanding.

## 2014-07-12 ENCOUNTER — Encounter: Payer: Self-pay | Admitting: Interventional Cardiology

## 2014-07-12 ENCOUNTER — Ambulatory Visit (INDEPENDENT_AMBULATORY_CARE_PROVIDER_SITE_OTHER): Payer: PPO | Admitting: Interventional Cardiology

## 2014-07-12 VITALS — BP 150/70 | HR 50 | Ht 67.5 in | Wt 221.0 lb

## 2014-07-12 DIAGNOSIS — I2510 Atherosclerotic heart disease of native coronary artery without angina pectoris: Secondary | ICD-10-CM

## 2014-07-12 DIAGNOSIS — E119 Type 2 diabetes mellitus without complications: Secondary | ICD-10-CM

## 2014-07-12 DIAGNOSIS — E7800 Pure hypercholesterolemia, unspecified: Secondary | ICD-10-CM

## 2014-07-12 DIAGNOSIS — I495 Sick sinus syndrome: Secondary | ICD-10-CM

## 2014-07-12 DIAGNOSIS — E669 Obesity, unspecified: Secondary | ICD-10-CM

## 2014-07-12 DIAGNOSIS — I10 Essential (primary) hypertension: Secondary | ICD-10-CM

## 2014-07-12 DIAGNOSIS — E1169 Type 2 diabetes mellitus with other specified complication: Secondary | ICD-10-CM

## 2014-07-12 NOTE — Progress Notes (Signed)
[facility] [address], [address] [place], [place]  [address] Phone: [phone] Fax:  [phone]  Date:  07/12/2014   ID:  LAREN ESTRIDGE, DOB 1930-08-23, MRN [id number]  PCP:  Kandice Hams, MD   ASSESSMENT:  1. Chronotropic incompetence. Rule out sinus node dysfunction 2. Coronary artery disease, asymptomatic with reference to angina 3. Exertional dyspnea and fatigue, likely related to chronotropic incompetence 4. Hypertension, essential and stable  PLAN:  1. 48-hour Holter monitor 2. Exercise treadmill test 3. If this evaluation is unrevealing, will see the patient again in one year. 4. If evidence of chronotropic incompetence, we will refer the patient for permanent pacemaker implantation with rate responsiveness.   SUBJECTIVE: Ryan Hunt is a 81 y.o. male who has a major complaint of exertional fatigue and dyspnea. This progressive over the last several years. He has no orthopnea, lower extremity swelling, or evidence of heart failure. He has been seen by pulmonary and has normal patient. He noticed today that his heart rate stays in the low 50s and sometimes in the high 40s. He is on no medications slow heart rate. He denies angina.   Wt Readings from Last 3 Encounters:  07/12/14 221 lb (100.245 kg)  09/08/13 223 lb (101.152 kg)  08/20/13 225 lb (102.059 kg)     Past Medical History  Diagnosis Date  . Hypertension   . Bronchitis 11/2011    "first time for me"  . GERD (gastroesophageal reflux disease)   . Exertional dyspnea     Chronic  . Type II diabetes mellitus   . Kidney stone     "dr told me my kidney was loaded w/stones; imbedded"  . Arthritis     "knees"  . Diverticulosis of colon (without mention of hemorrhage)   . Labyrinthitis   . Osteoarthrosis, unspecified whether generalized or localized, lower leg   . Hypercholesterolemia   . Mixed hyperlipidemia   . Diastolic heart failure   . Leg edema   . Diabetes mellitus with kidney disease   .  Coronary atherosclerosis of native coronary artery   . CAD (coronary artery disease)     With DES circumflex 02/2009  . Palpitations   . Prostate cancer     s/p prostatectomy  . History of peptic ulcer disease     Remote history  . Meralgia paresthetica     Current Outpatient Prescriptions  Medication Sig Dispense Refill  . amLODipine (NORVASC) 5 MG tablet Take 5 mg by mouth daily.    Marland Kitchen aspirin 325 MG tablet Take 325 mg by mouth daily.    . furosemide (LASIX) 20 MG tablet Take 20 mg by mouth daily.     Marland Kitchen glipiZIDE (GLUCOTROL XL) 2.5 MG 24 hr tablet Take 2.5 mg by mouth 2 (two) times daily.    . metFORMIN (GLUCOPHAGE) 500 MG tablet Take 500 mg by mouth 2 (two) times daily with a meal.     . naproxen sodium (ALEVE) 220 MG tablet Take 220 mg by mouth as needed.    . nitroGLYCERIN (NITROSTAT) 0.4 MG SL tablet Place 0.4 mg under the tongue every 5 (five) minutes as needed. For chest pain    . olmesartan-hydrochlorothiazide (BENICAR HCT) 20-12.5 MG per tablet Take 0.5 tablets by mouth daily.    Marland Kitchen omeprazole (PRILOSEC) 20 MG capsule Take 20 mg by mouth daily.    . rosuvastatin (CRESTOR) 20 MG tablet Take 20 mg by mouth daily.     No current facility-administered medications  for this visit.    Allergies:    Allergies  Allergen Reactions  . Amoxicillin Hives and Other (See Comments)    "sores in my mouth"  . Oxycodone Hcl     agitation  . Simvastatin     itching    Social History:  The patient  reports that he quit smoking about 41 years ago. His smoking use included Cigarettes. He has a 7.5 pack-year smoking history. He has never used smokeless tobacco. He reports that he drinks alcohol. He reports that he does not use illicit drugs.   ROS:  Please see the history of present illness.   No syncope. Denies transient neurological symptoms. No peripheral edema. He has no cough or wheezing.   All other systems reviewed and negative.   OBJECTIVE: VS:  BP 150/70 mmHg  Pulse 50  Ht 5'  7.5" (1.715 m)  Wt 221 lb (100.245 kg)  BMI 34.08 kg/m2 Well nourished, well developed, in no acute distress, obese HEENT: normal Neck: JVD flat. Carotid bruit absent  Cardiac:  normal S1, S2; RRR; no murmur Lungs:  clear to auscultation bilaterally, no wheezing, rhonchi or rales Abd: soft, nontender, no hepatomegaly Ext: Edema absent. Pulses 2+ Skin: warm and dry Neuro:  CNs 2-12 intact, no focal abnormalities noted  EKG:  Sinus bradycardia with poor R-wave progression       Signed, Illene Labrador III, MD 07/12/2014 8:52 AM

## 2014-07-12 NOTE — Patient Instructions (Signed)
Your physician recommends that you continue on your current medications as directed. Please refer to the Current Medication list given to you today.  Your physician has requested that you have an exercise tolerance test. For further information please visit [email or web address]. Please also follow instruction sheet, as given.  Your physician has recommended that you wear a holter monitor. Holter monitors are medical devices that record the heart's electrical activity. Doctors most often use these monitors to diagnose arrhythmias. Arrhythmias are problems with the speed or rhythm of the heartbeat. The monitor is a small, portable device. You can wear one while you do your normal daily activities. This is usually used to diagnose what is causing palpitations/syncope (passing out).  Your physician recommends that you schedule a follow-up appointment pending results or 1 year

## 2014-07-20 ENCOUNTER — Encounter: Payer: Self-pay | Admitting: *Deleted

## 2014-07-20 ENCOUNTER — Encounter (INDEPENDENT_AMBULATORY_CARE_PROVIDER_SITE_OTHER): Payer: PPO

## 2014-07-20 DIAGNOSIS — I498 Other specified cardiac arrhythmias: Secondary | ICD-10-CM

## 2014-07-20 DIAGNOSIS — I495 Sick sinus syndrome: Secondary | ICD-10-CM

## 2014-07-20 NOTE — Progress Notes (Signed)
Patient ID: Ryan Hunt, male   DOB: [birth year], 81 y.o.   MRN: [id number] [employer] 48 hour holter monitor applied to patient.

## 2014-08-02 ENCOUNTER — Telehealth: Payer: Self-pay

## 2014-08-02 NOTE — Telephone Encounter (Signed)
F/U ° ° ° ° ° ° ° ° ° °Pt returning call. Please call back.  °

## 2014-08-02 NOTE — Telephone Encounter (Signed)
Called to give pt holter monitor results and Dr.Smith's recommendations. [email or web address]

## 2014-08-02 NOTE — Telephone Encounter (Signed)
Pt aware of Holter results -Bradycardia -Rate related BBB Overall suspicious for chronotropic incompetence Pt needs an ett (already scheduled) Consult with EP Adv him that a scheduler from our office will call him to schedule an EP consult. Pt agreeable and verbalized understanding.

## 2014-08-17 ENCOUNTER — Encounter: Payer: PRIVATE HEALTH INSURANCE | Admitting: Nurse Practitioner

## 2014-08-18 ENCOUNTER — Ambulatory Visit (INDEPENDENT_AMBULATORY_CARE_PROVIDER_SITE_OTHER): Payer: PPO | Admitting: Nurse Practitioner

## 2014-08-18 ENCOUNTER — Encounter: Payer: Self-pay | Admitting: Nurse Practitioner

## 2014-08-18 DIAGNOSIS — I495 Sick sinus syndrome: Secondary | ICD-10-CM

## 2014-08-18 DIAGNOSIS — I10 Essential (primary) hypertension: Secondary | ICD-10-CM

## 2014-08-18 NOTE — Progress Notes (Signed)
Exercise Treadmill Test  Pre-Exercise Testing Evaluation Rhythm: sinus bradycardia  Rate: 48 bpm     Test  Exercise Tolerance Test Ordering MD: Daneen Schick, MD  Interpreting MD: Truitt Merle, NP  Unique Test No: 1  Treadmill:  1  Indication for ETT: chronotropic competence  Contraindication to ETT: No   Stress Modality: exercise - treadmill  Cardiac Imaging Performed: non   Protocol: standard Bruce - maximal  Max BP:  222/88  Max MPHR (bpm):  139 85% MPR (bpm):  118  MPHR obtained (bpm):  107 % MPHR obtained:  76%  Reached 85% MPHR (min:sec):  N/A Total Exercise Time (min-sec):  5:30  Workload in METS:  3.4 Borg Scale: 15  Reason ETT Terminated:  patient's desire to stop    ST Segment Analysis At Rest: NSR with LBBB noted With Exercise: N/A  Other Information Arrhythmia:  No Angina during ETT:  absent (0) Quality of ETT:  non-diagnostic  ETT Interpretation:  Non-diagnostic. Has resting LBBB. Maximum heart rate was 107. Test limited by dyspnea and back pain. Rare PVC noted.   Comments:  Patient presents today for routine GXT. Currently being worked up for possible PPM implant. To see EP next week.  Patient admits he has significant issues with walking - gait very unsteady. His back pain is treated with oral Morphine. Resting LBBB noted on EKG. Starting HR of 48.  Today the patient exercised on the modified Bruce protocol for a total of 5:30 minutes.  Reduced exercise tolerance.  Adequate blood pressure response but hypertensive at start of the study and with exercise.  Clinically negative for chest pain. Test was stopped due to fatigue/dyspnea. EKG non-diagnostic for ischemia. No significant arrhythmia noted except for rate PAC and PVC.    Recommendations: See Dr. Caryl Comes as planned for further discussion.  Patient is agreeable to this plan and will call if any problems develop in the interim.   Burtis Junes, RN, [facility] [address] [place], [place]  [address] [phone]

## 2014-08-24 ENCOUNTER — Encounter: Payer: Self-pay | Admitting: Internal Medicine

## 2014-08-24 ENCOUNTER — Ambulatory Visit (INDEPENDENT_AMBULATORY_CARE_PROVIDER_SITE_OTHER): Payer: PPO | Admitting: Internal Medicine

## 2014-08-24 VITALS — BP 162/70 | HR 59 | Ht 67.5 in | Wt 219.8 lb

## 2014-08-24 DIAGNOSIS — Z79899 Other long term (current) drug therapy: Secondary | ICD-10-CM

## 2014-08-24 DIAGNOSIS — I5020 Unspecified systolic (congestive) heart failure: Secondary | ICD-10-CM

## 2014-08-24 DIAGNOSIS — I495 Sick sinus syndrome: Secondary | ICD-10-CM

## 2014-08-24 MED ORDER — LISINOPRIL 20 MG PO TABS: 20.0000 mg | ORAL_TABLET | Freq: Every day | ORAL | Status: DC

## 2014-08-24 MED ORDER — FUROSEMIDE 20 MG PO TABS: 20.0000 mg | ORAL_TABLET | Freq: Every day | ORAL | Status: DC

## 2014-08-24 NOTE — Patient Instructions (Addendum)
Your physician has recommended you make the following change in your medication:  1) DECREASE Aspirin to 81 mg daily 2) Re-Start Furosemide at:  Take 40 mg one day, next day 20 mg, next day 40, next day 20.  Alternate this for 7 days.  Then change to 20 mg daily. 3) STOP Olmesartan (Benicar) 4) START Lisinopril 20 mg daily  Your physician recommends that you return for lab work in: 1 week for BMET  Your physician has requested that you have an echocardiogram. Echocardiography is a painless test that uses sound waves to create images of your heart. It provides your doctor with information about the size and shape of your heart and how well your heart's chambers and valves are working. This procedure takes approximately one hour. There are no restrictions for this procedure.  Follow up with Dr. Tamala Julian

## 2014-08-24 NOTE — Progress Notes (Signed)
ELECTROPHYSIOLOGY CONSULT NOTE  Patient ID: Ryan Hunt, MRN: [id number], DOB/AGE: 79/01/1931 79 y.o. Admit date: (Not on file) Date of Consult: 08/24/2014  Primary Physician: Kandice Hams, MD Primary Cardiologist: [facility]  Chief Complaint: chronotropic incompetence   HPI Ryan Hunt is a 81 y.o. male   Referred for evaluation of chronotropic competence.  He has had symptoms of dyspnea which date back over the last year or 2. He brought in and shared with Dr. Tamala Julian his record of vital signs recorded at home which showed heart rates largely in the 61s. Because of this, he was submitted for Holter monitoring which demonstrated a mean rate of 55 with a range of 36--101. He was then submitted for treadmill testing   2/16; this was notable for blood pressures of over 200 a resting rate of 48  and a peak heart rate  of 95 he had rate related LBBB  He notes some problems with peripheral edema. He denies nocturnal dyspnea or orthopnea.  Catheterization 8/10 included a stenting of his circumflex with a DES; ejection fraction at that time was 65%. Myoview scan 3/15 demonstrated attenuated inferior wall consistent with the diaphragm no ischemia and normal ejection fraction  Heart rates over the last couple of years of mostly range in the high 50s.  He has sleep disordered breathing and snoring. He denies somnolence during the day   Past Medical History  Diagnosis Date  . Hypertension   . Bronchitis 11/2011    "first time for me"  . GERD (gastroesophageal reflux disease)   . Exertional dyspnea     Chronic  . Type II diabetes mellitus   . Kidney stone     "dr told me my kidney was loaded w/stones; imbedded"  . Arthritis     "knees"  . Diverticulosis of colon (without mention of hemorrhage)   . Labyrinthitis   . Osteoarthrosis, unspecified whether generalized or localized, lower leg   . Hypercholesterolemia   . Mixed hyperlipidemia   . Diastolic heart failure   . Leg edema     . Diabetes mellitus with kidney disease   . Coronary atherosclerosis of native coronary artery   . CAD (coronary artery disease)     With DES circumflex 02/2009  . Palpitations   . Prostate cancer     s/p prostatectomy  . History of peptic ulcer disease     Remote history  . Meralgia paresthetica       Surgical History:  Past Surgical History  Procedure Laterality Date  . Colonoscopy    . Total shoulder replacement  1990's    bilaterally  . Joint replacement    . Cataract extraction w/ intraocular lens implant & anterior vitrectomy, bilateral  1990's  . Cardiac catheterization    . Total knee arthroplasty  12/31/2011    Procedure: TOTAL KNEE ARTHROPLASTY;  Surgeon: Lorn Junes, MD;  Location: [place];  Service: Orthopedics;  Laterality: Left;  Left total knee arthroplasty  . Coronary angioplasty with stent placement  02/2009    Stent x 1 (DES circumflex)  . Skin lumps removed    . Lasik Bilateral   . Prostatectomy  2009    Robotic prostatectomy      Home Meds: Prior to Admission medications   Medication Sig Start Date End Date Taking? Authorizing Provider  amLODipine (NORVASC) 5 MG tablet Take 5 mg by mouth daily.   Yes Historical Provider, MD  aspirin 325 MG tablet Take 325 mg by mouth  daily.   Yes Historical Provider, MD  furosemide (LASIX) 20 MG tablet Take 20 mg by mouth daily.  04/15/14  Yes Historical Provider, MD  glipiZIDE (GLUCOTROL XL) 2.5 MG 24 hr tablet Take 2.5 mg by mouth 2 (two) times daily.   Yes Historical Provider, MD  metFORMIN (GLUCOPHAGE) 500 MG tablet Take 500 mg by mouth 2 (two) times daily with a meal.    Yes Historical Provider, MD  naproxen sodium (ALEVE) 220 MG tablet Take 220 mg by mouth as needed.   Yes Historical Provider, MD  nitroGLYCERIN (NITROSTAT) 0.4 MG SL tablet Place 0.4 mg under the tongue every 5 (five) minutes as needed. For chest pain   Yes Historical Provider, MD  olmesartan-hydrochlorothiazide (BENICAR HCT) 20-12.5 MG per tablet  Take 0.5 tablets by mouth daily. 02/19/13  Yes Tanda Rockers, MD  omeprazole (PRILOSEC) 20 MG capsule Take 20 mg by mouth daily.   Yes Historical Provider, MD  rosuvastatin (CRESTOR) 20 MG tablet Take 20 mg by mouth daily.   Yes Historical Provider, MD      Allergies:  Allergies  Allergen Reactions  . Amoxicillin Hives and Other (See Comments)    "sores in my mouth"  . Oxycodone Hcl     agitation  . Simvastatin     itching    History   Social History  . Marital Status: Married    Spouse Name: N/A  . Number of Children: N/A  . Years of Education: N/A   Occupational History  . Retired    Social History Main Topics  . Smoking status: Former Smoker -- 0.50 packs/day for 15 years    Types: Cigarettes    Quit date: 07/09/1973  . Smokeless tobacco: Never Used  . Alcohol Use: 0.0 oz/week     Comment: 01/01/12 "occassionall have a glass of beer or wine; not even once a week"  . Drug Use: No  . Sexual Activity: No   Other Topics Concern  . Not on file   Social History Narrative     Family History  Problem Relation Age of Onset  . CVA Father   . CAD Father   . Prostate cancer Father   . Heart disease Father     ASHD  . Nephritis Brother   . COPD Brother   . CVA Mother   . Tremor Mother   . Dementia Mother   . Heart attack Neg Hx   . Stroke Father      ROS:  Please see the history of present illness.     All other systems reviewed and negative.    Physical Exam: Blood pressure 162/70, pulse 59, height 5' 7.5" (1.715 m), weight 219 lb 12.8 oz (99.701 kg). General: Well developed, well nourished male in no acute distress. Head: Normocephalic, atraumatic, sclera non-icteric, no xanthomas, nares are without discharge. EENT: normal Lymph Nodes:  none Back: without scoliosis/kyphosis, no CVA tendersness Neck: Negative for carotid bruits. JVD 8-10 Lungs: Clear bilaterally to auscultation without wheezes, rales, or rhonchi. Breathing is unlabored. Heart: RRR with S1  S2.  2/6 systolic murmur , rubs, or gallops appreciated. Abdomen: Soft, non-tender, non-distended with normoactive bowel sounds. No hepatomegaly. No rebound/guarding. No obvious abdominal masses. Msk:  Strength and tone appear normal for age. Extremities: No clubbing or cyanosis.  2+ edema.  Distal pedal pulses are 2+ and equal bilaterally. Skin: Warm and Dry Neuro: Alert and oriented X 3. CN III-XII intact Grossly normal sensory and motor function . Psych:  Responds to questions appropriately with a normal affect.      Labs: Cardiac Enzymes No results for input(s): CKTOTAL, CKMB, TROPONINI in the last 72 hours. CBC Lab Results  Component Value Date   WBC 5.4 04/02/2013   HGB 13.7 04/02/2013   HCT 40.1 04/02/2013   MCV 89.4 04/02/2013   PLT 120.0* 04/02/2013   PROTIME: No results for input(s): LABPROT, INR in the last 72 hours. Chemistry No results for input(s): NA, K, CL, CO2, BUN, CREATININE, CALCIUM, PROT, BILITOT, ALKPHOS, ALT, AST, GLUCOSE in the last 168 hours.  Invalid input(s): LABALBU Lipids No results found for: CHOL, HDL, LDLCALC, TRIG BNP PRO B NATRIURETIC PEPTIDE (BNP)  Date/Time Value Ref Range Status  04/02/2013 09:37 AM 75.0 0.0 - 100.0 pg/mL Final   Miscellaneous No results found for: DDIMER  Radiology/Studies:  No results found.  EKG: *Sinus rhythm at 59 Intervals 19/14/44 Axis XL Left bundle branch block  Assessment and Plan:   Rate related left bundle branch block  Sinus bradycardia with adequate heart rate response to exercise  Exercise associated hypertension  Hypertension   sleep disordered breathing  HFpEF  He has evidence of volume overload. I suspect that this is contributing to his dyspnea on exertion. I discussed this case with Dr. Rebecca Eaton. We will resume his furosemide which had been discontinued by his PCP unbeknownst all of [place]. We will start him on 40 mg alternating with 20 for one week and then resume 20 mg a day. He will need a  metabolic profile in one week's time. I should note that he has grade 3 renal dysfunction with creatinine of 1.2in 9/14 -these are last data.  In addition, he has significant hypertension as recorded from his numbers at home we will increase his antihypertensives and discontinue his olmesartan secondary to cost concerns and begin him on lisinopril 20 mg a day.  We will decrease his aspirin from 325--81.   I've also recommended to him that he discuss with Dr. Tamala Julian the possibility of a sleep study which may be contributing to his hypertension and right-sided heart failure.   We will get an echo   He will follow-up with Dr. Tamala Julian.   Virl Axe

## 2014-08-31 ENCOUNTER — Other Ambulatory Visit (INDEPENDENT_AMBULATORY_CARE_PROVIDER_SITE_OTHER): Payer: PPO | Admitting: *Deleted

## 2014-08-31 ENCOUNTER — Ambulatory Visit (HOSPITAL_COMMUNITY): Payer: PPO | Attending: Cardiology | Admitting: Cardiology

## 2014-08-31 DIAGNOSIS — I495 Sick sinus syndrome: Secondary | ICD-10-CM

## 2014-08-31 DIAGNOSIS — Z79899 Other long term (current) drug therapy: Secondary | ICD-10-CM

## 2014-08-31 DIAGNOSIS — I5020 Unspecified systolic (congestive) heart failure: Secondary | ICD-10-CM | POA: Diagnosis not present

## 2014-08-31 DIAGNOSIS — I492 Junctional premature depolarization: Secondary | ICD-10-CM

## 2014-08-31 LAB — BASIC METABOLIC PANEL
BUN: 25 mg/dL — AB (ref 6–23)
CALCIUM: 10.4 mg/dL (ref 8.4–10.5)
CO2: 30 mEq/L (ref 19–32)
CREATININE: 1.18 mg/dL (ref 0.40–1.50)
Chloride: 103 mEq/L (ref 96–112)
GFR: 62.81 mL/min (ref >60.00)
Glucose, Bld: 71 mg/dL (ref 70–99)
Potassium: 4.1 mEq/L (ref 3.5–5.1)
Sodium: 139 mEq/L (ref 135–145)

## 2014-08-31 NOTE — Progress Notes (Signed)
Echo performed. 

## 2014-12-10 ENCOUNTER — Telehealth: Payer: Self-pay | Admitting: Interventional Cardiology

## 2014-12-10 NOTE — Telephone Encounter (Signed)
Walk in pt form-[facility]-Clearance form dropped off will give to [facility] Monday

## 2014-12-15 ENCOUNTER — Telehealth: Payer: Self-pay

## 2014-12-15 NOTE — Telephone Encounter (Signed)
Signed cardiac clearance rqst placed in MR nurse fax box to be faxed to [facility]

## 2014-12-22 ENCOUNTER — Ambulatory Visit (HOSPITAL_COMMUNITY)
Admission: RE | Admit: 2014-12-22 | Discharge: 2014-12-22 | Disposition: A | Discharge status: Home / Self Care | Payer: PPO | Source: Ambulatory Visit | Attending: Anesthesiology | Admitting: Anesthesiology

## 2014-12-22 ENCOUNTER — Encounter (HOSPITAL_COMMUNITY): Payer: Self-pay

## 2014-12-22 ENCOUNTER — Encounter (HOSPITAL_COMMUNITY)
Admission: RE | Admit: 2014-12-22 | Discharge: 2014-12-22 | Disposition: A | Discharge status: Home / Self Care | Payer: PPO | Source: Ambulatory Visit | Attending: Orthopedic Surgery | Admitting: Orthopedic Surgery

## 2014-12-22 DIAGNOSIS — Z01818 Encounter for other preprocedural examination: Secondary | ICD-10-CM

## 2014-12-22 LAB — BASIC METABOLIC PANEL
ANION GAP: 9 (ref 5–15)
BUN: 18 mg/dL (ref 6–20)
CO2: 23 mmol/L (ref 22–32)
Calcium: 9.6 mg/dL (ref 8.9–10.3)
Chloride: 106 mmol/L (ref 101–111)
Creatinine, Ser: 1.1 mg/dL (ref 0.61–1.24)
GFR calc Af Amer: >60 mL/min (ref >60)
GFR calc non Af Amer: >60 mL/min (ref >60)
GLUCOSE: 209 mg/dL — AB (ref 65–99)
Potassium: 4.1 mmol/L (ref 3.5–5.1)
Sodium: 138 mmol/L (ref 135–145)

## 2014-12-22 LAB — SURGICAL PCR SCREEN
MRSA, PCR: NEGATIVE
Staphylococcus aureus: NEGATIVE

## 2014-12-22 LAB — GLUCOSE, CAPILLARY: Glucose-Capillary: 184 mg/dL — ABNORMAL HIGH (ref 65–99)

## 2014-12-22 NOTE — Pre-Procedure Instructions (Signed)
KAYVEON GUIANG  12/22/2014      [facility] [address], [place] - [address] [place] [address] Phone: [phone] Fax: [phone]    Your procedure is scheduled on Thursday, June 16th   Report to [facility] Admitting at 9:30 A.M.  Call this number if you have problems the morning of surgery:  [phone]   Remember:  Do not eat food or drink liquids after midnight tonight.  Take these medicines the morning of surgery with A SIP OF WATER : Norvasc, Omeprazole, Hydromorphone.              DO NOT TAKE any diabetes medication the morning of surgery.   Do not wear jewelry - no rings or watches.  Do not wear lotions or colognes.    You may NOT wear deodorant the day of surgey.             Men may shave face and neck.   Do not bring valuables to the hospital.  [facility] is not responsible for any belongings or valuables.             Contacts, dentures or bridgework may not be worn into surgery.  Leave your suitcase in the car.  After surgery it may be brought to your room.             For patients admitted to the hospital, discharge time will be determined by your treatment team.                Name and phone number of your driver:                 Special instructions: Preparing for Surgery instruction sheet.  Please read over the following fact sheets that you were given. Pain Booklet, Coughing and Deep Breathing, MRSA Information and Surgical Site Infection Prevention

## 2014-12-22 NOTE — Progress Notes (Addendum)
Denies any chest discomfort.  Sees Dr. Linard Millers whom he last saw 3-4 mths ago.  Dr Tamala Julian is also the one who did the heart cath back in 2010 with angioplasty.  Treadmill was just done in 08/2014.   PCP is Dr. Seward Carol who has manages his diabetes.  PAT visit blood sugar was 184.  DA

## 2014-12-23 ENCOUNTER — Inpatient Hospital Stay (HOSPITAL_COMMUNITY)
Admission: RE | Admit: 2014-12-23 | Discharge: 2014-12-24 | DRG: 517 | Disposition: A | Discharge status: Home / Self Care | Payer: PPO | Source: Ambulatory Visit | Attending: Orthopedic Surgery | Admitting: Orthopedic Surgery

## 2014-12-23 ENCOUNTER — Inpatient Hospital Stay (HOSPITAL_COMMUNITY): Payer: PPO

## 2014-12-23 ENCOUNTER — Inpatient Hospital Stay (HOSPITAL_COMMUNITY): Payer: PPO | Admitting: Certified Registered Nurse Anesthetist

## 2014-12-23 ENCOUNTER — Encounter (HOSPITAL_COMMUNITY): Payer: Self-pay | Admitting: *Deleted

## 2014-12-23 ENCOUNTER — Encounter (HOSPITAL_COMMUNITY)
Admission: RE | Disposition: A | Discharge status: Home / Self Care | Payer: PPO | Source: Ambulatory Visit | Attending: Orthopedic Surgery

## 2014-12-23 DIAGNOSIS — Z8546 Personal history of malignant neoplasm of prostate: Secondary | ICD-10-CM | POA: Diagnosis not present

## 2014-12-23 DIAGNOSIS — Z8249 Family history of ischemic heart disease and other diseases of the circulatory system: Secondary | ICD-10-CM | POA: Diagnosis not present

## 2014-12-23 DIAGNOSIS — M5136 Other intervertebral disc degeneration, lumbar region: Secondary | ICD-10-CM | POA: Diagnosis present

## 2014-12-23 DIAGNOSIS — Z885 Allergy status to narcotic agent status: Secondary | ICD-10-CM

## 2014-12-23 DIAGNOSIS — Z79899 Other long term (current) drug therapy: Secondary | ICD-10-CM

## 2014-12-23 DIAGNOSIS — Z79891 Long term (current) use of opiate analgesic: Secondary | ICD-10-CM

## 2014-12-23 DIAGNOSIS — Z419 Encounter for procedure for purposes other than remedying health state, unspecified: Secondary | ICD-10-CM

## 2014-12-23 DIAGNOSIS — E119 Type 2 diabetes mellitus without complications: Secondary | ICD-10-CM | POA: Diagnosis present

## 2014-12-23 DIAGNOSIS — Z7982 Long term (current) use of aspirin: Secondary | ICD-10-CM | POA: Diagnosis not present

## 2014-12-23 DIAGNOSIS — I2510 Atherosclerotic heart disease of native coronary artery without angina pectoris: Secondary | ICD-10-CM | POA: Diagnosis present

## 2014-12-23 DIAGNOSIS — M7138 Other bursal cyst, other site: Principal | ICD-10-CM | POA: Diagnosis present

## 2014-12-23 DIAGNOSIS — Z880 Allergy status to penicillin: Secondary | ICD-10-CM | POA: Diagnosis not present

## 2014-12-23 DIAGNOSIS — Z87891 Personal history of nicotine dependence: Secondary | ICD-10-CM

## 2014-12-23 DIAGNOSIS — M4806 Spinal stenosis, lumbar region: Secondary | ICD-10-CM | POA: Diagnosis present

## 2014-12-23 DIAGNOSIS — Z955 Presence of coronary angioplasty implant and graft: Secondary | ICD-10-CM | POA: Diagnosis not present

## 2014-12-23 DIAGNOSIS — I10 Essential (primary) hypertension: Secondary | ICD-10-CM | POA: Diagnosis present

## 2014-12-23 DIAGNOSIS — Z9079 Acquired absence of other genital organ(s): Secondary | ICD-10-CM | POA: Diagnosis present

## 2014-12-23 DIAGNOSIS — Z87442 Personal history of urinary calculi: Secondary | ICD-10-CM | POA: Diagnosis not present

## 2014-12-23 DIAGNOSIS — M47896 Other spondylosis, lumbar region: Secondary | ICD-10-CM | POA: Diagnosis present

## 2014-12-23 DIAGNOSIS — Z96652 Presence of left artificial knee joint: Secondary | ICD-10-CM | POA: Diagnosis present

## 2014-12-23 DIAGNOSIS — E780 Pure hypercholesterolemia: Secondary | ICD-10-CM | POA: Diagnosis present

## 2014-12-23 HISTORY — PX: LUMBAR LAMINECTOMY/DECOMPRESSION MICRODISCECTOMY: SHX5026

## 2014-12-23 LAB — GLUCOSE, CAPILLARY
GLUCOSE-CAPILLARY: 165 mg/dL — AB (ref 65–99)
GLUCOSE-CAPILLARY: 122 mg/dL — AB (ref 65–99)
GLUCOSE-CAPILLARY: 162 mg/dL — AB (ref 65–99)
Glucose-Capillary: 142 mg/dL — ABNORMAL HIGH (ref 65–99)

## 2014-12-23 LAB — CBC
HEMATOCRIT: 38.8 % — AB (ref 39.0–52.0)
HEMOGLOBIN: 13.5 g/dL (ref 13.0–17.0)
MCH: 29.9 pg (ref 26.0–34.0)
MCHC: 34.8 g/dL (ref 30.0–36.0)
MCV: 86 fL (ref 78.0–100.0)
Platelets: 140 10*3/uL — ABNORMAL LOW (ref 150–400)
RBC: 4.51 MIL/uL (ref 4.22–5.81)
RDW: 16.1 % — ABNORMAL HIGH (ref 11.5–15.5)
WBC: 5.6 10*3/uL (ref 4.0–10.5)

## 2014-12-23 LAB — HEMOGLOBIN A1C
HEMOGLOBIN A1C: 7.3 % — AB (ref 4.8–5.6)
Mean Plasma Glucose: 163 mg/dL

## 2014-12-23 SURGERY — LUMBAR LAMINECTOMY/DECOMPRESSION MICRODISCECTOMY 1 LEVEL
Anesthesia: General

## 2014-12-23 MED ORDER — CEFAZOLIN SODIUM-DEXTROSE 2-3 GM-% IV SOLR
INTRAVENOUS | Status: DC | PRN
  Administered 2014-12-23: 2 g via INTRAVENOUS

## 2014-12-23 MED ORDER — PROPOFOL 10 MG/ML IV BOLUS
INTRAVENOUS | Status: DC | PRN
  Administered 2014-12-23: 120 mg via INTRAVENOUS

## 2014-12-23 MED ORDER — HYDROMORPHONE HCL 1 MG/ML IJ SOLN: 0.2500 mg | INTRAMUSCULAR | Status: DC | PRN

## 2014-12-23 MED ORDER — BUPIVACAINE-EPINEPHRINE 0.25% -1:200000 IJ SOLN
INTRAMUSCULAR | Status: DC | PRN
  Administered 2014-12-23: 10 mL

## 2014-12-23 MED ORDER — METFORMIN HCL 500 MG PO TABS
500.0000 mg | ORAL_TABLET | Freq: Two times a day (BID) | ORAL | Status: DC
  Administered 2014-12-23 – 2014-12-24 (×2): 500 mg via ORAL
  Filled 2014-12-23 (×4): qty 1

## 2014-12-23 MED ORDER — FENTANYL CITRATE (PF) 100 MCG/2ML IJ SOLN
INTRAMUSCULAR | Status: DC | PRN
  Administered 2014-12-23 (×4): 50 ug via INTRAVENOUS

## 2014-12-23 MED ORDER — LIDOCAINE HCL (CARDIAC) 20 MG/ML IV SOLN
INTRAVENOUS | Status: DC | PRN
  Administered 2014-12-23: 50 mg via INTRAVENOUS

## 2014-12-23 MED ORDER — PROPOFOL 10 MG/ML IV BOLUS
INTRAVENOUS | Status: AC
  Filled 2014-12-23: qty 20

## 2014-12-23 MED ORDER — SODIUM CHLORIDE 0.9 % IJ SOLN: 3.0000 mL | Freq: Two times a day (BID) | INTRAMUSCULAR | Status: DC

## 2014-12-23 MED ORDER — GLIPIZIDE ER 2.5 MG PO TB24
2.5000 mg | ORAL_TABLET | Freq: Two times a day (BID) | ORAL | Status: DC
  Administered 2014-12-23 – 2014-12-24 (×2): 2.5 mg via ORAL
  Filled 2014-12-23 (×6): qty 1

## 2014-12-23 MED ORDER — LISINOPRIL 20 MG PO TABS
20.0000 mg | ORAL_TABLET | Freq: Every day | ORAL | Status: DC
  Administered 2014-12-23 – 2014-12-24 (×2): 20 mg via ORAL
  Filled 2014-12-23 (×2): qty 1

## 2014-12-23 MED ORDER — LACTATED RINGERS IV SOLN
INTRAVENOUS | Status: DC
  Administered 2014-12-23 (×3): via INTRAVENOUS

## 2014-12-23 MED ORDER — INSULIN ASPART 100 UNIT/ML ~~LOC~~ SOLN
0.0000 [IU] | Freq: Three times a day (TID) | SUBCUTANEOUS | Status: DC
  Administered 2014-12-24 (×2): 3 [IU] via SUBCUTANEOUS

## 2014-12-23 MED ORDER — MENTHOL 3 MG MT LOZG: 1.0000 | LOZENGE | OROMUCOSAL | Status: DC | PRN

## 2014-12-23 MED ORDER — ACETAMINOPHEN 325 MG PO TABS: 325.0000 mg | ORAL_TABLET | ORAL | Status: DC | PRN

## 2014-12-23 MED ORDER — NEOSTIGMINE METHYLSULFATE 10 MG/10ML IV SOLN
INTRAVENOUS | Status: DC | PRN
  Administered 2014-12-23: 5 mg via INTRAVENOUS

## 2014-12-23 MED ORDER — INSULIN ASPART 100 UNIT/ML ~~LOC~~ SOLN
0.0000 [IU] | SUBCUTANEOUS | Status: DC
  Administered 2014-12-23: 2 [IU] via SUBCUTANEOUS

## 2014-12-23 MED ORDER — ACETAMINOPHEN 160 MG/5ML PO SOLN
325.0000 mg | ORAL | Status: DC | PRN
  Filled 2014-12-23: qty 20.3

## 2014-12-23 MED ORDER — ROCURONIUM BROMIDE 100 MG/10ML IV SOLN
INTRAVENOUS | Status: DC | PRN
  Administered 2014-12-23: 10 mg via INTRAVENOUS
  Administered 2014-12-23: 40 mg via INTRAVENOUS

## 2014-12-23 MED ORDER — FENTANYL CITRATE (PF) 250 MCG/5ML IJ SOLN
INTRAMUSCULAR | Status: AC
  Filled 2014-12-23: qty 5

## 2014-12-23 MED ORDER — ONDANSETRON HCL 4 MG/2ML IJ SOLN: 4.0000 mg | INTRAMUSCULAR | Status: DC | PRN

## 2014-12-23 MED ORDER — METHOCARBAMOL 1000 MG/10ML IJ SOLN
500.0000 mg | Freq: Four times a day (QID) | INTRAVENOUS | Status: DC | PRN
  Filled 2014-12-23: qty 5

## 2014-12-23 MED ORDER — AMLODIPINE BESYLATE 5 MG PO TABS
5.0000 mg | ORAL_TABLET | Freq: Every day | ORAL | Status: DC
  Administered 2014-12-24: 5 mg via ORAL
  Filled 2014-12-23 (×2): qty 1

## 2014-12-23 MED ORDER — LABETALOL HCL 5 MG/ML IV SOLN
INTRAVENOUS | Status: DC | PRN
  Administered 2014-12-23: 2.5 mg via INTRAVENOUS
  Administered 2014-12-23: 7.5 mg via INTRAVENOUS

## 2014-12-23 MED ORDER — PHENYLEPHRINE HCL 10 MG/ML IJ SOLN
INTRAMUSCULAR | Status: DC | PRN
  Administered 2014-12-23: 80 ug via INTRAVENOUS

## 2014-12-23 MED ORDER — THROMBIN 20000 UNITS EX SOLR
CUTANEOUS | Status: AC
  Filled 2014-12-23: qty 20000

## 2014-12-23 MED ORDER — ONDANSETRON HCL 4 MG/2ML IJ SOLN
INTRAMUSCULAR | Status: AC
  Filled 2014-12-23: qty 2

## 2014-12-23 MED ORDER — NEOSTIGMINE METHYLSULFATE 10 MG/10ML IV SOLN
INTRAVENOUS | Status: AC
  Filled 2014-12-23: qty 1

## 2014-12-23 MED ORDER — GLYCOPYRROLATE 0.2 MG/ML IJ SOLN
INTRAMUSCULAR | Status: DC | PRN
  Administered 2014-12-23: .8 mg via INTRAVENOUS
  Administered 2014-12-23 (×2): 0.2 mg via INTRAVENOUS

## 2014-12-23 MED ORDER — BUPIVACAINE-EPINEPHRINE (PF) 0.25% -1:200000 IJ SOLN
INTRAMUSCULAR | Status: AC
  Filled 2014-12-23: qty 30

## 2014-12-23 MED ORDER — SODIUM CHLORIDE 0.9 % IV SOLN
1500.0000 mg | Freq: Once | INTRAVENOUS | Status: AC
  Administered 2014-12-23: 1500 mg via INTRAVENOUS
  Filled 2014-12-23: qty 1500

## 2014-12-23 MED ORDER — THROMBIN 20000 UNITS EX KIT
PACK | CUTANEOUS | Status: DC | PRN
  Administered 2014-12-23: 20000 [IU] via TOPICAL

## 2014-12-23 MED ORDER — INSULIN ASPART 100 UNIT/ML ~~LOC~~ SOLN: 0.0000 [IU] | Freq: Every day | SUBCUTANEOUS | Status: DC

## 2014-12-23 MED ORDER — HYDROCODONE-ACETAMINOPHEN 7.5-325 MG PO TABS: 1.0000 | ORAL_TABLET | Freq: Once | ORAL | Status: DC | PRN

## 2014-12-23 MED ORDER — DEXAMETHASONE 4 MG PO TABS
4.0000 mg | ORAL_TABLET | Freq: Four times a day (QID) | ORAL | Status: DC
  Administered 2014-12-23 – 2014-12-24 (×4): 4 mg via ORAL
  Filled 2014-12-23 (×7): qty 1

## 2014-12-23 MED ORDER — ONDANSETRON HCL 4 MG/2ML IJ SOLN
INTRAMUSCULAR | Status: DC | PRN
  Administered 2014-12-23: 4 mg via INTRAVENOUS

## 2014-12-23 MED ORDER — METHOCARBAMOL 500 MG PO TABS
ORAL_TABLET | ORAL | Status: AC
  Filled 2014-12-23: qty 1

## 2014-12-23 MED ORDER — GLYCOPYRROLATE 0.2 MG/ML IJ SOLN
INTRAMUSCULAR | Status: AC
  Filled 2014-12-23: qty 9

## 2014-12-23 MED ORDER — LACTATED RINGERS IV SOLN: INTRAVENOUS | Status: DC

## 2014-12-23 MED ORDER — DEXAMETHASONE SODIUM PHOSPHATE 4 MG/ML IJ SOLN
4.0000 mg | Freq: Four times a day (QID) | INTRAMUSCULAR | Status: DC
  Filled 2014-12-23 (×4): qty 1

## 2014-12-23 MED ORDER — NITROGLYCERIN 0.4 MG SL SUBL: 0.4000 mg | SUBLINGUAL_TABLET | SUBLINGUAL | Status: DC | PRN

## 2014-12-23 MED ORDER — PHENOL 1.4 % MT LIQD
1.0000 | OROMUCOSAL | Status: DC | PRN
  Filled 2014-12-23: qty 177

## 2014-12-23 MED ORDER — SODIUM CHLORIDE 0.9 % IJ SOLN: 3.0000 mL | INTRAMUSCULAR | Status: DC | PRN

## 2014-12-23 MED ORDER — FUROSEMIDE 20 MG PO TABS
20.0000 mg | ORAL_TABLET | Freq: Every day | ORAL | Status: DC
  Administered 2014-12-23 – 2014-12-24 (×2): 20 mg via ORAL
  Filled 2014-12-23 (×2): qty 1

## 2014-12-23 MED ORDER — ACETAMINOPHEN 10 MG/ML IV SOLN
1000.0000 mg | Freq: Four times a day (QID) | INTRAVENOUS | Status: DC
  Administered 2014-12-23 – 2014-12-24 (×3): 1000 mg via INTRAVENOUS
  Filled 2014-12-23 (×4): qty 100

## 2014-12-23 MED ORDER — EPHEDRINE SULFATE 50 MG/ML IJ SOLN
INTRAMUSCULAR | Status: DC | PRN
  Administered 2014-12-23 (×2): 10 mg via INTRAVENOUS

## 2014-12-23 MED ORDER — METHOCARBAMOL 500 MG PO TABS
500.0000 mg | ORAL_TABLET | Freq: Four times a day (QID) | ORAL | Status: DC | PRN
  Administered 2014-12-23 – 2014-12-24 (×2): 500 mg via ORAL
  Filled 2014-12-23 (×2): qty 1

## 2014-12-23 MED ORDER — PHENYLEPHRINE 40 MCG/ML (10ML) SYRINGE FOR IV PUSH (FOR BLOOD PRESSURE SUPPORT)
PREFILLED_SYRINGE | INTRAVENOUS | Status: AC
Start: 2014-12-23 — End: 2014-12-23
  Filled 2014-12-23: qty 20

## 2014-12-23 MED ORDER — OXYCODONE HCL 5 MG PO TABS
10.0000 mg | ORAL_TABLET | ORAL | Status: DC | PRN
  Administered 2014-12-24: 10 mg via ORAL
  Filled 2014-12-23: qty 2

## 2014-12-23 SURGICAL SUPPLY — 58 items
BUR EGG ELITE 4.0 (BURR) IMPLANT
BUR EGG ELITE 4.0MM (BURR)
BUR MATCHSTICK NEURO 3.0 LAGG (BURR) IMPLANT
CANISTER SUCTION 2500CC (MISCELLANEOUS) ×3 IMPLANT
CLOSURE STERI-STRIP 1/2X4 (GAUZE/BANDAGES/DRESSINGS) ×1
CLSR STERI-STRIP ANTIMIC 1/2X4 (GAUZE/BANDAGES/DRESSINGS) ×2 IMPLANT
CORDS BIPOLAR (ELECTRODE) ×3 IMPLANT
COVER SURGICAL LIGHT HANDLE (MISCELLANEOUS) ×3 IMPLANT
DRAIN CHANNEL 15F RND FF W/TCR (WOUND CARE) IMPLANT
DRAPE POUCH INSTRU U-SHP 10X18 (DRAPES) ×1 IMPLANT
DRAPE SURG 17X23 STRL (DRAPES) ×3 IMPLANT
DRAPE U-SHAPE 47X51 STRL (DRAPES) ×3 IMPLANT
DRSG MEPILEX BORDER 4X8 (GAUZE/BANDAGES/DRESSINGS) ×3 IMPLANT
DURAPREP 26ML APPLICATOR (WOUND CARE) ×3 IMPLANT
ELECT BLADE 4.0 EZ CLEAN MEGAD (MISCELLANEOUS)
ELECT CAUTERY BLADE 6.4 (BLADE) ×3 IMPLANT
ELECT PENCIL ROCKER SW 15FT (MISCELLANEOUS) ×3 IMPLANT
ELECT REM PT RETURN 9FT ADLT (ELECTROSURGICAL) ×3
ELECTRODE BLDE 4.0 EZ CLN MEGD (MISCELLANEOUS) IMPLANT
ELECTRODE REM PT RTRN 9FT ADLT (ELECTROSURGICAL) ×1 IMPLANT
EVACUATOR SILICONE 100CC (DRAIN) IMPLANT
GLOVE BIOGEL PI IND STRL 8 (GLOVE) ×1 IMPLANT
GLOVE BIOGEL PI IND STRL 8.5 (GLOVE) ×1 IMPLANT
GLOVE BIOGEL PI INDICATOR 8 (GLOVE) ×2
GLOVE BIOGEL PI INDICATOR 8.5 (GLOVE) ×2
GLOVE ORTHO TXT STRL SZ7.5 (GLOVE) ×3 IMPLANT
GLOVE SS BIOGEL STRL SZ 8.5 (GLOVE) ×1 IMPLANT
GLOVE SUPERSENSE BIOGEL SZ 8.5 (GLOVE) ×2
GOWN STRL REUS W/TWL 2XL LVL3 (GOWN DISPOSABLE) ×6 IMPLANT
KIT BASIN OR (CUSTOM PROCEDURE TRAY) ×3 IMPLANT
KIT ROOM TURNOVER OR (KITS) ×3 IMPLANT
NDL SPNL 18GX3.5 QUINCKE PK (NEEDLE) ×2 IMPLANT
NEEDLE 22X1 1/2 (OR ONLY) (NEEDLE) ×3 IMPLANT
NEEDLE SPNL 18GX3.5 QUINCKE PK (NEEDLE) ×6 IMPLANT
NS IRRIG 1000ML POUR BTL (IV SOLUTION) ×3 IMPLANT
PACK LAMINECTOMY ORTHO (CUSTOM PROCEDURE TRAY) ×3 IMPLANT
PACK UNIVERSAL I (CUSTOM PROCEDURE TRAY) ×3 IMPLANT
PAD ARMBOARD 7.5X6 YLW CONV (MISCELLANEOUS) ×6 IMPLANT
PATTIES SURGICAL .5 X.5 (GAUZE/BANDAGES/DRESSINGS) IMPLANT
PATTIES SURGICAL .5 X1 (DISPOSABLE) ×3 IMPLANT
SPONGE SURGIFOAM ABS GEL 100 (HEMOSTASIS) IMPLANT
SURGIFLO TRUKIT (HEMOSTASIS) IMPLANT
SUT BONE WAX W31G (SUTURE) ×3 IMPLANT
SUT MON AB 3-0 SH 27 (SUTURE) ×3
SUT MON AB 3-0 SH27 (SUTURE) ×1 IMPLANT
SUT VIC AB 0 CT1 27 (SUTURE) ×3
SUT VIC AB 0 CT1 27XBRD ANBCTR (SUTURE) ×1 IMPLANT
SUT VIC AB 1 CT1 18XCR BRD 8 (SUTURE) ×1 IMPLANT
SUT VIC AB 1 CT1 8-18 (SUTURE) ×3
SUT VIC AB 1 CTX 36 (SUTURE) ×6
SUT VIC AB 1 CTX36XBRD ANBCTR (SUTURE) ×2 IMPLANT
SUT VIC AB 2-0 CT1 18 (SUTURE) ×3 IMPLANT
SYR BULB IRRIGATION 50ML (SYRINGE) ×3 IMPLANT
SYR CONTROL 10ML LL (SYRINGE) ×3 IMPLANT
TOWEL OR 17X24 6PK STRL BLUE (TOWEL DISPOSABLE) ×3 IMPLANT
TOWEL OR 17X26 10 PK STRL BLUE (TOWEL DISPOSABLE) ×3 IMPLANT
WATER STERILE IRR 1000ML POUR (IV SOLUTION) ×3 IMPLANT
YANKAUER SUCT BULB TIP NO VENT (SUCTIONS) ×3 IMPLANT

## 2014-12-23 NOTE — Anesthesia Postprocedure Evaluation (Signed)
Anesthesia Post Note  Patient: Ryan Hunt  Procedure(s) Performed: Procedure(s) (LRB):  L2-3 DECOMPRESSION (N/A)  Anesthesia type: General  Patient location: PACU  Post pain: Pain level controlled and Adequate analgesia  Post assessment: Post-op Vital signs reviewed, Patient's Cardiovascular Status Stable, Respiratory Function Stable, Patent Airway and Pain level controlled  Last Vitals:  Filed Vitals:   12/23/14 1327  BP:   Pulse: 49  Temp:   Resp: 20    Post vital signs: Reviewed and stable  Level of consciousness: awake, alert  and oriented  Complications: No apparent anesthesia complications

## 2014-12-23 NOTE — Brief Op Note (Signed)
12/23/2014  12:38 PM  PATIENT:  Ryan Hunt  81 y.o. male  PRE-OPERATIVE DIAGNOSIS:  SPINAL STENOISIS L2-3,STENOVA CYST L2-3 ON LEFT  POST-OPERATIVE DIAGNOSIS:  SPINAL STENOISIS L2-3,STENOVA CYST L2-3 ON LEFT  PROCEDURE:  Procedure(s):  L2-3 DECOMPRESSION (N/A)  SURGEON:  Surgeon(s) and Role:    * Melina Schools, MD - Primary  PHYSICIAN ASSISTANT:   ASSISTANTS: none   ANESTHESIA:   general  EBL:  Total I/O In: 1000 [I.V.:1000] Out: 50 [Blood:50]  BLOOD ADMINISTERED:none  DRAINS: none   LOCAL MEDICATIONS USED:  MARCAINE     SPECIMEN:  Source of Specimen:  synovial cyst left L2/3 facet  DISPOSITION OF SPECIMEN:  PATHOLOGY  COUNTS:  YES  TOURNIQUET:  * No tourniquets in log *  DICTATION: .Other Dictation: Dictation Number [id number]  PLAN OF CARE: Admit to inpatient   PATIENT DISPOSITION:  PACU - hemodynamically stable.

## 2014-12-23 NOTE — Anesthesia Preprocedure Evaluation (Signed)
Anesthesia Evaluation  Patient identified by MRN, date of birth, ID band Patient awake    Reviewed: Allergy & Precautions, NPO status , Patient's Chart, lab work & pertinent test results  Airway Mallampati: II  TM Distance: >3 FB Neck ROM: Full    Dental  (+) Edentulous Upper, Edentulous Lower   Pulmonary former smoker,  breath sounds clear to auscultation        Cardiovascular hypertension, Pt. on medications and Pt. on home beta blockers + CAD and + Cardiac Stents - Peripheral Vascular Disease and - CHF - dysrhythmias - Valvular Problems/MurmursRhythm:Regular     Neuro/Psych    GI/Hepatic Neg liver ROS, GERD-  Medicated and Controlled,  Endo/Other  diabetes, Type 2, Oral Hypoglycemic AgentsMorbid obesity  Renal/GU Renal InsufficiencyRenal disease     Musculoskeletal   Abdominal   Peds  Hematology  (+) anemia ,   Anesthesia Other Findings   Reproductive/Obstetrics                             Anesthesia Physical Anesthesia Plan  ASA: III  Anesthesia Plan: General   Post-op Pain Management:    Induction: Intravenous  Airway Management Planned: Oral ETT  Additional Equipment: None  Intra-op Plan:   Post-operative Plan: Extubation in OR  Informed Consent: I have reviewed the patients History and Physical, chart, labs and discussed the procedure including the risks, benefits and alternatives for the proposed anesthesia with the patient or authorized representative who has indicated his/her understanding and acceptance.   Dental advisory given  Plan Discussed with: CRNA and Surgeon  Anesthesia Plan Comments:         Anesthesia Quick Evaluation

## 2014-12-23 NOTE — Transfer of Care (Signed)
Immediate Anesthesia Transfer of Care Note  Patient: Ryan Hunt  Procedure(s) Performed: Procedure(s):  L2-3 DECOMPRESSION (N/A)  Patient Location: PACU  Anesthesia Type:General  Level of Consciousness: awake and alert   Airway & Oxygen Therapy: Patient Spontanous Breathing and Patient connected to nasal cannula oxygen  Post-op Assessment: Report given to RN and Post -op Vital signs reviewed and stable  Post vital signs: Reviewed and stable  Last Vitals:  Filed Vitals:   12/23/14 0952  BP: 173/54  Pulse: 58  Temp: 36.6 C  Resp: 20    Complications: No apparent anesthesia complications

## 2014-12-23 NOTE — Anesthesia Procedure Notes (Signed)
Procedure Name: Intubation Performed by: Yailine Ballard J Pre-anesthesia Checklist: Patient identified, Emergency Drugs available, Suction available, Patient being monitored and Timeout performed Patient Re-evaluated:Patient Re-evaluated prior to inductionOxygen Delivery Method: Circle system utilized Preoxygenation: Pre-oxygenation with 100% oxygen Intubation Type: IV induction Ventilation: Mask ventilation without difficulty Laryngoscope Size: Mac and 3 Grade View: Grade I Tube type: Oral Tube size: 7.5 mm Number of attempts: 1 Airway Equipment and Method: Stylet Placement Confirmation: ETT inserted through vocal cords under direct vision,  positive ETCO2,  CO2 detector and breath sounds checked- equal and bilateral Secured at: 23 cm Tube secured with: Tape Dental Injury: Teeth and Oropharynx as per pre-operative assessment        

## 2014-12-23 NOTE — Progress Notes (Signed)
ANTIBIOTIC CONSULT NOTE - INITIAL  Pharmacy Consult for Vancomycin  Indication: Surgical Prophylaxis   Allergies  Allergen Reactions  . Amoxicillin Hives and Other (See Comments)    "sores in my mouth"  . Oxycodone Hcl     agitation  . Simvastatin     itching    Patient Measurements: Height: 5\' 8"  (172.7 cm) Weight: 215 lb (97.523 kg) IBW/kg (Calculated) : 68.4  Vital Signs: Temp: 98.5 F (36.9 C) (06/16 1515) Temp Source: Oral (06/16 0952) BP: 150/62 mmHg (06/16 1515) Pulse Rate: 51 (06/16 1515) Intake/Output from previous day:   Intake/Output from this shift: Total I/O In: 1300 [P.O.:50; I.V.:1250] Out: 50 [Blood:50]  Labs:  Recent Labs  12/22/14 1451  WBC 5.6  HGB 13.5  PLT 140*  CREATININE 1.10   Estimated Creatinine Clearance: 58.6 mL/min (by C-G formula based on Cr of 1.1). No results for input(s): VANCOTROUGH, VANCOPEAK, VANCORANDOM, GENTTROUGH, GENTPEAK, GENTRANDOM, TOBRATROUGH, TOBRAPEAK, TOBRARND, AMIKACINPEAK, AMIKACINTROU, AMIKACIN in the last 72 hours.   Microbiology: Recent Results (from the past 720 hour(s))  Surgical pcr screen     Status: None   Collection Time: 12/22/14  2:51 PM  Result Value Ref Range Status   MRSA, PCR NEGATIVE NEGATIVE Final   Staphylococcus aureus NEGATIVE NEGATIVE Final    Comment:        The Xpert SA Assay (FDA approved for NASAL specimens in patients over 7 years of age), is one component of a comprehensive surveillance program.  Test performance has been validated by [facility] for patients greater than or equal to 72 year old. It is not intended to diagnose infection nor to guide or monitor treatment.     Medical History: Past Medical History  Diagnosis Date  . Hypertension   . Bronchitis 11/2011    "first time for me"  . GERD (gastroesophageal reflux disease)   . Arthritis     "knees"  . Diverticulosis of colon (without mention of hemorrhage)   . Labyrinthitis   . Osteoarthrosis, unspecified  whether generalized or localized, lower leg   . Hypercholesterolemia   . Mixed hyperlipidemia   . Diastolic heart failure   . Leg edema   . Coronary atherosclerosis of native coronary artery   . CAD (coronary artery disease)     With DES circumflex 02/2009  . Palpitations   . Prostate cancer     s/p prostatectomy  . History of peptic ulcer disease     Remote history  . Meralgia paresthetica   . Exertional dyspnea     Chronic  . Type II diabetes mellitus     dx 3 yrs ago  . Kidney stone     "dr told me my kidney was loaded w/stones; imbedded"  . Diabetes mellitus with kidney disease     Assessment: 89 YOM s/p spinal surgery. Pharmacy is consulted to dose vancomycin for post-op prophylaxis. No drain, confirmed with RN. Noted pt. With amoxicillin allergy (hives), and received ancef 2g pre-op at 1100. Est. crcl ~ 60 ml/min.   Plan:  - Vancomycin 1500 mg IV x 1 at 1900 - Pharmacy sign off.  Thanks.  Maryanna Shape, PharmD, BCPS  Clinical Pharmacist  Pager: [phone]   12/23/2014,4:39 PM

## 2014-12-23 NOTE — Plan of Care (Signed)
Problem: Consults Goal: Diagnosis - Spinal Surgery Outcome: Completed/Met Date Met:  12/23/14 Microdiscectomy     

## 2014-12-23 NOTE — H&P (Signed)
History of Present Illness The patient is a 81 year old male who comes in today for a preoperative History and Physical. The patient is scheduled for a L2-3 decompression to be performed by Dr. Duane Lope D. Rolena Infante, MD at [facility] on 12-23-14 . Please see the hospital record for complete dictated history and physical.  Allergies OxyCODONE HCl *ANALGESICS - OPIOID* hallucinations Amoxicillin *PENICILLINS*  Family History Cerebrovascular Accident Father. Congestive Heart Failure Mother. Chronic Obstructive Lung Disease Brother. Hypertension Father, Mother. Heart Disease Father, Mother. Kidney disease Brother.  Social History Living situation live with spouse Exercise Exercises daily; does running / walking No history of drug/alcohol rehab Marital status married Children 1 Current work status retired Current drinker 10/06/2014: Currently drinks beer only occasionally per week Tobacco / smoke exposure 10/06/2014: no Number of flights of stairs before winded less than 1 Tobacco use Former smoker. 10/06/2014: smoke(d) 3/4 pack(s) per day  Medication History  Dilaudid (2MG  Tablet, 1 (one) Tablet Oral three times daily, as needed for severe pain, Taken starting 11/25/2014) Active. MetFORMIN HCl (500MG  Tablet, Oral) Active. Melatonin (3MG  Capsule, Oral) Active. Furosemide (20MG  Tablet, Oral) Active. Aspirin (81MG  Tablet, 1 (one) Oral) Active. GlipiZIDE ER (2.5MG  Tablet ER 24HR, Oral) Active. Lisinopril (20MG  Tablet, Oral) Active. Crestor (10MG  Tablet, Oral) Active. Vitamin B-12 (1000MCG Tablet, 1 (one) Oral) Active. Medications Reconciled  Past Surgical History Total Knee Replacement left Heart Stents Cataract Surgery bilateral Rotator Cuff Repair bilateral Prostatectomy; Abdominal  Other Problems  Prostate Cancer High blood pressure Diabetes Mellitus, Type II Kidney Stone Hypercholesterolemia Coronary artery disease  Vitals   12/20/2014 9:27 AM Weight: 210 lb Height: 68in Body Surface Area: 2.09 m Body Mass Index: 31.93 kg/m  Temp.: 97.11F(Oral)  BP: 168/82 (Sitting, Left Arm, Standard)  Objective Transcription He is a pleasant gentleman who appears younger than his stated age. He is alert. He is oriented x3. No shortness of breath or chest pain. The abdomen is soft and nontender. Regular rate and rhythm. No loss of bowel and bladder control. He has significant ongoing left buttock, lateral hip and anterior thigh and groin pain which persist. He ambulates with a cane because of the pain. He has no numbness, tingling, or pain below the level of the knees. Compartments are soft and nontender. Altered gait pattern because of the pain and slight limp on the left side. Back pain is moderate to significant especially with extension of the spine. Essentially, no change in his clinical exam from his evaluation on 12/09/2014.   RADIOGRAPHS MRI from 11/15/2014 shows a left L2-3 synovial cyst with facet arthrosis and marked spinal stenosis. He has minor degenerative changes at L3-4, L4-5 and L5-S1 with hard disc osteophytes, central disc protrusion, but no significant stenosis.  X-rays today in my office of the lumbar spine demonstrate no scoliosis, no spondylolisthesis, moderate to advanced collapse of the discs at L4-5 and L5-S1. No significant degenerative collapse at L2-3, but there are anterior traction spurs. There are also calcifications within the descending vasculature.  Assessment & Plan   I have spoken with the patient, his wife and his son. They are present for the dictation. We have gone over the surgical procedure, which is a posterior L2-3 decompression and excision of the left synovial cyst. We have reviewed the risks which include infection, bleeding, nerve damage, death, stroke, paralysis, failure to heal, need for further surgery, leak of spinal fluid, ongoing or worse pain. All of their questions  were encouraged and addressed. We have gotten preoperative clearance  from Dr. Delfina Redwood and we will move forward this Thursday with surgery.

## 2014-12-24 ENCOUNTER — Encounter (HOSPITAL_COMMUNITY): Payer: Self-pay | Admitting: Orthopedic Surgery

## 2014-12-24 LAB — GLUCOSE, CAPILLARY
GLUCOSE-CAPILLARY: 193 mg/dL — AB (ref 65–99)
Glucose-Capillary: 196 mg/dL — ABNORMAL HIGH (ref 65–99)

## 2014-12-24 MED ORDER — DOCUSATE SODIUM 100 MG PO CAPS: 100.0000 mg | ORAL_CAPSULE | Freq: Three times a day (TID) | ORAL | Status: DC | PRN

## 2014-12-24 MED ORDER — METHOCARBAMOL 500 MG PO TABS: 500.0000 mg | ORAL_TABLET | Freq: Three times a day (TID) | ORAL | Status: DC | PRN

## 2014-12-24 MED ORDER — POLYETHYLENE GLYCOL 3350 17 GM/SCOOP PO POWD: 17.0000 g | Freq: Every day | ORAL | Status: DC

## 2014-12-24 MED ORDER — TRAMADOL-ACETAMINOPHEN 37.5-325 MG PO TABS: 1.0000 | ORAL_TABLET | Freq: Four times a day (QID) | ORAL | Status: DC | PRN

## 2014-12-24 MED ORDER — ONDANSETRON HCL 4 MG PO TABS: 4.0000 mg | ORAL_TABLET | Freq: Three times a day (TID) | ORAL | Status: DC | PRN

## 2014-12-24 MED FILL — Thrombin For Soln 20000 Unit: CUTANEOUS | Qty: 1 | Status: AC

## 2014-12-24 NOTE — Evaluation (Signed)
Physical Therapy Evaluation Patient Details Name: Ryan Hunt MRN: [id number] DOB: Nov 17, 1930 Today's Date: 12/24/2014   History of Present Illness  pt presents with L2-3 Lami with Cyst Removal.  pt with hx of bil shoulder surgeries.    Clinical Impression  Pt eager for D/C to home.  Pt ed on importance of maintaining back precautions and lifting restrictions until ok with MD.  Pt able to don brace without A and states his children and grandchildren will be assisting he and his wife at D/C.  Will sign off.      Follow Up Recommendations No PT follow up;Supervision/Assistance - 24 hour    Equipment Recommendations  None recommended by PT    Recommendations for Other Services       Precautions / Restrictions Precautions Precautions: Back Precaution Booklet Issued: No Precaution Comments: Reviewed back precautions and handout will be given with D/C paperwork.   Restrictions Weight Bearing Restrictions: No      Mobility  Bed Mobility Overal bed mobility: Needs Assistance Bed Mobility: Rolling;Sidelying to Sit Rolling: Supervision Sidelying to sit: Supervision       General bed mobility comments: cues for log roll technique.    Transfers Overall transfer level: Modified independent Equipment used: Straight cane                Ambulation/Gait Ambulation/Gait assistance: Supervision Ambulation Distance (Feet): 250 Feet Assistive device: Straight cane Gait Pattern/deviations: Step-through pattern;Decreased stride length     General Gait Details: pt moves slowly and uses cane on L hand despite L LE needing support.  pt indicates he has been told to use cane in hand hand, but has trouble coordinating.    Stairs Stairs: Yes Stairs assistance: Min guard Stair Management: One rail Right;Step to pattern;Forwards;With cane Number of Stairs: 3 General stair comments: pt moves slowly, but demonstrates good use of railing and cane.    Wheelchair Mobility     Modified Rankin (Stroke Patients Only)       Balance Overall balance assessment: No apparent balance deficits (not formally assessed)                                           Pertinent Vitals/Pain Pain Assessment: 0-10 Pain Score: 3  Pain Location: Back Pain Descriptors / Indicators: Aching Pain Intervention(s): Monitored during session;Premedicated before session;Repositioned    Home Living Family/patient expects to be discharged to:: Private residence Living Arrangements: Spouse/significant other Available Help at Discharge: Family;Available 24 hours/day Type of Home: House Home Access: Stairs to enter Entrance Stairs-Rails: Right;Left;Can reach both Entrance Stairs-Number of Steps: 2 Home Layout: One level Home Equipment: Cane - single point Additional Comments: Wife only able to provide S, children to provide A while pt is recovering.      Prior Function Level of Independence: Needs assistance   Gait / Transfers Assistance Needed: Uses cane for all mobility.    ADL's / Homemaking Assistance Needed: Family perform heavy chores and outdoor chores.        Hand Dominance        Extremity/Trunk Assessment   Upper Extremity Assessment: Overall WFL for tasks assessed           Lower Extremity Assessment: Overall WFL for tasks assessed      Cervical / Trunk Assessment: Kyphotic  Communication   Communication: No difficulties  Cognition Arousal/Alertness: Awake/alert Behavior During Therapy: WFL for  tasks assessed/performed Overall Cognitive Status: Within Functional Limits for tasks assessed                      General Comments      Exercises        Assessment/Plan    PT Assessment Patent does not need any further PT services  PT Diagnosis Difficulty walking   PT Problem List    PT Treatment Interventions     PT Goals (Current goals can be found in the Care Plan section) Acute Rehab PT Goals Patient Stated Goal:  Go Home. PT Goal Formulation: All assessment and education complete, DC therapy    Frequency     Barriers to discharge        Co-evaluation               End of Session Equipment Utilized During Treatment: Back brace Activity Tolerance: Patient tolerated treatment well Patient left: in bed;with call bell/phone within reach (Sitting EOB awaiting breakfast) Nurse Communication: Mobility status         Time: [id number] PT Time Calculation (min) (ACUTE ONLY): 14 min   Charges:   PT Evaluation $Initial PT Evaluation Tier I: 1 Procedure     PT G CodesCatarina Hartshorn, [place] 12/24/2014, 9:54 AM

## 2014-12-24 NOTE — Op Note (Signed)
Ryan Hunt, Ryan Hunt NO.:  [id number]  MEDICAL RECORD NO.:  [id number]  LOCATION:  3C07C                        FACILITY:  [facility]  PHYSICIAN:  Kamrynn Melott D. Rolena Infante, M.D. DATE OF BIRTH:  1931/03/06  DATE OF PROCEDURE:  12/23/2014 DATE OF DISCHARGE:                              OPERATIVE REPORT   PREOPERATIVE DIAGNOSIS:  L2-3 facet cyst with thecal sac with stenosis and neurocompression.  POSTOPERATIVE DIAGNOSIS:  L2-3 facet cyst with thecal sac with stenosis and neurocompression.  PROCEDURE:  L2-3 left-sided laminotomy and facetectomy with removal of synovial cyst.  COMPLICATIONS:  None.  INTRAOPERATIVE FINDINGS:  Synovial cyst found at the L2-3 facet complex as per preoperative MRI, removed en bloc and sent to Pathology.  COMPLICATIONS:  None.  HISTORY:  This is a very pleasant 81 year old who was having severe debilitating back, buttock, and radicular leg pain on the left side. Attempts at conservative management had failed.  Imaging demonstrated a synovial cyst causing neural compression.  As a result of the findings, we elected to proceed with surgery.  All appropriate risks, benefits, and alternatives were discussed with the patient and consent was obtained.  OPERATIVE NOTE:  The patient was brought to the operating room and placed supine on the operating table.  After successful induction of general anesthesia and endotracheal intubation, TEDs and SCDs were applied.  He was turned prone onto the Wilson frame and all bony prominences were well-padded.  The back was prepped and draped in standard fashion.  Time-out was taken to confirm the patient, procedure and all other pertinent important data.  Once this was completed, two needles were placed into the back and x-ray was taken for localization of the incision.  Once the L2-3 disk space was identified, I marked out the midline incision and infiltrated with 0.25% Marcaine with epi. Midline incision  was made.  Sharp dissection was carried out down to the deep fascia.  I released the fascia from the left side and stripped the paraspinal muscles to expose the L2 and L3 spinous process and the L2-3 facet complex.  I then placed a Penfield 4 underneath the lamina of L2 and took a second x-ray confirming I was at the L2-3 level.  At this point, a generous laminotomy was performed using a 2-and 3-mm Kerrison punch.  I then released the ligamentum flavum from the leading edge of the L3 spinous process and used a 2- and 3-mm Kerrison to resect the ligamentum flavum to expose the thecal sac.  Once I was able to identify the cyst, I then proceeded with the decompression caudal to it.  Using my Penfield 4, I continued to make a plane between the ligamentum flavum and the thecal sac.  I then removed all the ligamentum flavum and trimmed down the medial aspect of the facet to the level of the pedicle. I then identified the L3 nerve root and traced this into the foramen. At this point, I was below the level of the cyst.  I then continued to dissect the cyst free of the thecal sac.  It was very adherent to it and parts.  I then continued to remove using a 2 and 3-mm Kerrison  punch, the medial aspect of the T3 facet complex.  During the exam, I then packed the bone wax.  I then went superior and continued my aggressive laminotomy of L2 until I was superior to the facet cyst.  At this point, I then dissected down with my Penfield 4 around it and then released the cyst and removed it en bloc.  Once this was removed, I then continued my lateral recess decompression.  At this point, it was assured that I could visualize the disk space at L2-3, now decompressed superior to the disk space down to the L3 nerve, which was corresponded to the areas of decompression.  I irrigated the wound copiously with normal saline. Hemostasis was obtained using bipolar electrocautery.  I then placed a thrombin-soaked  Gelfoam patty after copious irrigation over the exposed thecal sac and then closed the deep fascia with interrupted #1 Vicryl sutures.  I then used a layer of 2-0 Vicryl sutures and then 3-0 Monocryl.  Steri-Strips and a dry dressing were applied.  The patient was ultimately extubated, transferred to the PACU without incident.  At the end of the case, all needle and sponge counts were correct.  No adverse intraoperative events.     Natelie Ostrosky D. Rolena Infante, M.D.     DDB/MEDQ  D:  12/23/2014  T:  12/24/2014  Job:  [id number]

## 2014-12-24 NOTE — Progress Notes (Deleted)
OT Cancellation Note  Patient Details Name: Ryan Hunt MRN: [id number] DOB: 1931-03-05   Cancelled Treatment:    Reason Eval/Treat Not Completed: Other (comment) (Pt discharged prior to OT eval )  Genasis Zingale, [place], OTR/L [id number]' 12/24/2014, 10:57 AM

## 2014-12-24 NOTE — Progress Notes (Signed)
Patient alert and oriented, mae's well, voiding adequate amount of urine, swallowing without difficulty, no c/o pain. Patient discharged home with family. Script and discharged instructions given to patient. Patient and family stated understanding of d/c instructions given and has an appointment with MD. 

## 2014-12-24 NOTE — Care Management (Signed)
Utilization review completed by Caytlyn Evers N. Shantoya Geurts, RN BSN 

## 2014-12-24 NOTE — Evaluation (Signed)
Occupational Therapy Evaluation Patient Details Name: Ryan Hunt MRN: [id number] DOB: Jun 09, 1931 Today's Date: 12/24/2014    History of Present Illness pt presents with L2-3 Lami with Cyst Removal.  pt with hx of bil shoulder surgeries.     Clinical Impression   the patient admitted with above. He is preparing for discharge.  He was unable to recall any of his back precautions and was noted to bend to pack up belongings and to retrieve needed items.  Reviewed back precautions with him, written info provided to him.  Reinforced safety with ADLs, but unsure he will comply.   No further OT needed     Follow Up Recommendations  No OT follow up;Supervision/Assistance - 24 hour    Equipment Recommendations  None recommended by OT    Recommendations for Other Services       Precautions / Restrictions Precautions Precautions: Back Precaution Booklet Issued: Yes (comment) Precaution Comments: Pt unable to recall back precautions and required max verbal cues to avoid bending and twisting  Required Braces or Orthoses: Spinal Brace Spinal Brace: Lumbar corset      Mobility Bed Mobility               General bed mobility comments: Pt ambulating rooms   Transfers Overall transfer level: Modified independent                    Balance Overall balance assessment: No apparent balance deficits (not formally assessed)                                          ADL Overall ADL's : Needs assistance/impaired                                       General ADL Comments: Pt reports he dressed himself today without assist.  He states he stood and bent forward to don pants.   Cautioned him re: fall risk and no bending.  Instructed him to cross his ankles over his knees to avoid bending and he was able to demonstrate ability to do so. Also reviewed use of LH bath sponge for bathing and to avoid bending during grooming.  Pt acknowledged  information, but required mod - max cues to adhere to precautions       Vision     Perception     Praxis      Pertinent Vitals/Pain Pain Assessment: 0-10 Pain Score: 2  Pain Location: back  Pain Descriptors / Indicators: Aching Pain Intervention(s): Monitored during session     Hand Dominance Right   Extremity/Trunk Assessment Upper Extremity Assessment Upper Extremity Assessment: Overall WFL for tasks assessed   Lower Extremity Assessment Lower Extremity Assessment: Defer to PT evaluation   Cervical / Trunk Assessment Cervical / Trunk Assessment: Kyphotic   Communication Communication Communication: No difficulties   Cognition Arousal/Alertness: Awake/alert Behavior During Therapy: WFL for tasks assessed/performed Overall Cognitive Status: Within Functional Limits for tasks assessed                     General Comments       Exercises       Shoulder Instructions      Home Living Family/patient expects to be discharged to:: Private residence Living Arrangements: Spouse/significant other Available Help  at Discharge: Family;Available 24 hours/day Type of Home: House Home Access: Stairs to enter [employer] of Steps: 2 Entrance Stairs-Rails: Right;Left;Can reach both Home Layout: One level               Home Equipment: Cane - single point   Additional Comments: Wife only able to provide S, children to provide A while pt is recovering.        Prior Functioning/Environment Level of Independence: Needs assistance  Gait / Transfers Assistance Needed: Uses cane for all mobility.   ADL's / Homemaking Assistance Needed: Family perform heavy chores and outdoor chores.        OT Diagnosis: Generalized weakness;Acute pain   OT Problem List:     OT Treatment/Interventions:      OT Goals(Current goals can be found in the care plan section) Acute Rehab OT Goals Patient Stated Goal: Go Home. OT Goal Formulation: All assessment and  education complete, DC therapy  OT Frequency:     Barriers to D/C:            Co-evaluation              End of Session Equipment Utilized During Treatment: Rolling walker;Back brace Nurse Communication: Mobility status  Activity Tolerance: Patient tolerated treatment well Patient left: Other (comment) (ambulating in room )   Time: [id number] OT Time Calculation (min): 17 min Charges:  OT General Charges $OT Visit: 1 Procedure OT Evaluation $Initial OT Evaluation Tier I: 1 Procedure G-Codes:    Khadir Roam, Ellard Artis M 01/23/15, 3:12 PM

## 2014-12-24 NOTE — Progress Notes (Signed)
    Subjective: Procedure(s) (LRB):  L2-3 DECOMPRESSION (N/A) 1 Day Post-Op  Patient reports pain as 2 on 0-10 scale.  Reports decreased leg pain reports incisional back pain   Positive void Negative bowel movement Positive flatus Negative chest pain or shortness of breath  Objective: Vital signs in last 24 hours: Temp:  [97.8 F (36.6 C)-98.5 F (36.9 C)] 98.4 F (36.9 C) (06/17 0414) Pulse Rate:  [49-76] 76 (06/17 0414) Resp:  [18-20] 20 (06/17 0414) BP: (138-173)/(54-76) 139/59 mmHg (06/17 0414) SpO2:  [94 %-98 %] 94 % (06/17 0414) Weight:  [97.523 kg (215 lb)-97.705 kg (215 lb 6.4 oz)] 97.523 kg (215 lb) (06/16 0952)  Intake/Output from previous day: 06/16 0701 - 06/17 0700 In: 1300 [P.O.:50; I.V.:1250] Out: 50 [Blood:50]  Labs:  Recent Labs  12/22/14 1451  WBC 5.6  RBC 4.51  HCT 38.8*  PLT 140*    Recent Labs  12/22/14 1451  NA 138  K 4.1  CL 106  CO2 23  BUN 18  CREATININE 1.10  GLUCOSE 209*  CALCIUM 9.6   No results for input(s): LABPT, INR in the last 72 hours.  Physical Exam: Neurologically intact ABD soft Dorsiflexion/Plantar flexion intact Incision: dressing C/D/I Compartment soft negative radicular leg pain  Assessment/Plan: Patient stable  xrays n/a Continue mobilization with physical therapy Continue care  Advance diet Up with therapy  Doing well - leg pain resolved.  Ok for d/c to home  Melina Schools, [place] [phone]

## 2014-12-28 NOTE — Discharge Summary (Signed)
Patient ID: Ryan Hunt MRN: [id number] DOB/AGE: 79/16/1932 79 y.o.  Admit date: 12/23/2014 Discharge date: 12/28/2014  Admission Diagnoses:  Active Problems:   Synovial cyst of lumbar facet joint   Discharge Diagnoses:  Active Problems:   Synovial cyst of lumbar facet joint  status post Procedure(s):  L2-3 DECOMPRESSION  Past Medical History  Diagnosis Date  . Hypertension   . Bronchitis 11/2011    "first time for me"  . GERD (gastroesophageal reflux disease)   . Arthritis     "knees"  . Diverticulosis of colon (without mention of hemorrhage)   . Labyrinthitis   . Osteoarthrosis, unspecified whether generalized or localized, lower leg   . Hypercholesterolemia   . Mixed hyperlipidemia   . Diastolic heart failure   . Leg edema   . Coronary atherosclerosis of native coronary artery   . CAD (coronary artery disease)     With DES circumflex 02/2009  . Palpitations   . Prostate cancer     s/p prostatectomy  . History of peptic ulcer disease     Remote history  . Meralgia paresthetica   . Exertional dyspnea     Chronic  . Type II diabetes mellitus     dx 3 yrs ago  . Kidney stone     "dr told me my kidney was loaded w/stones; imbedded"  . Diabetes mellitus with kidney disease     Surgeries: Procedure(s):  L2-3 DECOMPRESSION on 12/23/2014   Consultants:    Discharged Condition: Improved  Hospital Course: Ryan Hunt is an 81 y.o. male who was admitted 12/23/2014 for operative treatment of <principal problem not specified>. Patient failed conservative treatments (please see the history and physical for the specifics) and had severe unremitting pain that affects sleep, daily activities and work/hobbies. After pre-op clearance, the patient was taken to the operating room on 12/23/2014 and underwent  Procedure(s):  L2-3 DECOMPRESSION.    Patient was given perioperative antibiotics:  Anti-infectives    Start     Dose/Rate Route Frequency Ordered Stop   12/23/14 1900  vancomycin (VANCOCIN) 1,500 mg in sodium chloride 0.9 % 500 mL IVPB     1,500 mg 250 mL/hr over 120 Minutes Intravenous  Once 12/23/14 1646 12/23/14 2210       Patient was given sequential compression devices and early ambulation to prevent DVT.   Patient benefited maximally from hospital stay and there were no complications. At the time of discharge, the patient was urinating/moving their bowels without difficulty, tolerating a regular diet, pain is controlled with oral pain medications and they have been cleared by PT/OT.   Recent vital signs: No data found.    Recent laboratory studies: No results for input(s): WBC, HGB, HCT, PLT, NA, K, CL, CO2, BUN, CREATININE, GLUCOSE, INR, CALCIUM in the last 72 hours.  Invalid input(s): PT, 2   Discharge Medications:     Medication List    STOP taking these medications        ALEVE 220 MG tablet  Generic drug:  naproxen sodium     aspirin 81 MG tablet     HYDROmorphone 2 MG tablet  Commonly known as:  DILAUDID     rosuvastatin 20 MG tablet  Commonly known as:  CRESTOR      TAKE these medications        amLODipine 5 MG tablet  Commonly known as:  NORVASC  Take 5 mg by mouth daily.     docusate sodium 100 MG capsule  Commonly known as:  COLACE  Take 1 capsule (100 mg total) by mouth 3 (three) times daily as needed for mild constipation.     furosemide 20 MG tablet  Commonly known as:  LASIX  Take 1 tablet (20 mg total) by mouth daily.     glipiZIDE 2.5 MG 24 hr tablet  Commonly known as:  GLUCOTROL XL  Take 2.5 mg by mouth 2 (two) times daily.     lisinopril 20 MG tablet  Commonly known as:  PRINIVIL,ZESTRIL  Take 1 tablet (20 mg total) by mouth daily.     metFORMIN 500 MG tablet  Commonly known as:  GLUCOPHAGE  Take 500 mg by mouth 2 (two) times daily with a meal.     methocarbamol 500 MG tablet  Commonly known as:  ROBAXIN  Take 1 tablet (500 mg total) by mouth 3 (three) times daily as needed for  muscle spasms.     nitroGLYCERIN 0.4 MG SL tablet  Commonly known as:  NITROSTAT  Place 0.4 mg under the tongue every 5 (five) minutes as needed. For chest pain     omeprazole 20 MG capsule  Commonly known as:  PRILOSEC  Take 20 mg by mouth daily.     ondansetron 4 MG tablet  Commonly known as:  ZOFRAN  Take 1 tablet (4 mg total) by mouth every 8 (eight) hours as needed for nausea or vomiting.     polyethylene glycol powder powder  Commonly known as:  GLYCOLAX  Take 17 g by mouth daily.     traMADol-acetaminophen 37.5-325 MG per tablet  Commonly known as:  ULTRACET  Take 1 tablet by mouth every 6 (six) hours as needed (pain).        Diagnostic Studies: Dg Chest 2 View  12/22/2014   CLINICAL DATA:  Hypertension.  EXAM: CHEST  2 VIEW  COMPARISON:  02/19/2013.  FINDINGS: Mediastinum hilar structures normal. Lungs are clear. Stable cardiomegaly. No pulmonary venous congestion. No focal pulmonary infiltrate. Stable deformity left lower ribs consistent with old fracture. Degenerative changes thoracic spine.  IMPRESSION: No acute cardiopulmonary disease.  Stable cardiomegaly.  No CHF.   Electronically Signed   By: Marcello Moores  Register   On: 12/22/2014 15:32   Dg Lumbar Spine 1 View  12/23/2014   CLINICAL DATA:  L2-3 decompression  EXAM: LUMBAR SPINE - 1 VIEW  COMPARISON:  None.  FINDINGS: Single portable cross-table lateral view of the lumbar spine. There are 2 posterior metallic needles. The more superior needle tip projects between the T12-L1 spinous processes. The more inferior needle tip is just posterior to the inferior articulating facet of L1.  There is severe degenerative disc disease at L4-5 and L5-S1 with facet arthropathy.  IMPRESSION: Intraoperative localization radiograph.   Electronically Signed   By: Kathreen Devoid   On: 12/23/2014 15:10   Dg Spine Portable 1 View  12/23/2014   CLINICAL DATA:  L2-3 decompression.  EXAM: PORTABLE SPINE - 1 VIEW  COMPARISON:  None.  FINDINGS: No prior  MRI for comparison. For numbering purposes, the lowest disc space is considered L5-S1. There is moderate to advanced disc degeneration and spurring at L3-4, L4-5, L5-S1  Surgical instrument under the L2 lamina directed at the L2-3 disc space.  IMPRESSION: L2-3 disc space localized in the operating room.   Electronically Signed   By: Franchot Gallo M.D.   On: 12/23/2014 11:40          Follow-up Information    Follow up with  Dahlia Bailiff, MD In 2 weeks.   Specialty:  Orthopedic Surgery   Why:  For suture removal, For wound re-check   Contact information:   [address] [place] [address] [phone]       Discharge Plan:  discharge to home  Disposition: stable.  Hospital course uneventful.    Signed: Melina Schools D for Dr. Melina Schools [facility] [phone] 12/28/2014, 4:46 PM

## 2015-01-03 ENCOUNTER — Other Ambulatory Visit: Payer: Self-pay

## 2015-08-27 ENCOUNTER — Other Ambulatory Visit: Payer: Self-pay | Admitting: Internal Medicine

## 2015-09-12 ENCOUNTER — Other Ambulatory Visit: Payer: Self-pay | Admitting: Internal Medicine

## 2015-09-22 DIAGNOSIS — R350 Frequency of micturition: Secondary | ICD-10-CM | POA: Diagnosis not present

## 2015-09-22 DIAGNOSIS — Z1389 Encounter for screening for other disorder: Secondary | ICD-10-CM | POA: Diagnosis not present

## 2015-09-22 DIAGNOSIS — I5030 Unspecified diastolic (congestive) heart failure: Secondary | ICD-10-CM | POA: Diagnosis not present

## 2015-09-22 DIAGNOSIS — Z0000 Encounter for general adult medical examination without abnormal findings: Secondary | ICD-10-CM | POA: Diagnosis not present

## 2015-09-22 DIAGNOSIS — Z7984 Long term (current) use of oral hypoglycemic drugs: Secondary | ICD-10-CM | POA: Diagnosis not present

## 2015-09-22 DIAGNOSIS — E785 Hyperlipidemia, unspecified: Secondary | ICD-10-CM | POA: Diagnosis not present

## 2015-09-22 DIAGNOSIS — E1165 Type 2 diabetes mellitus with hyperglycemia: Secondary | ICD-10-CM | POA: Diagnosis not present

## 2015-09-22 DIAGNOSIS — Z8546 Personal history of malignant neoplasm of prostate: Secondary | ICD-10-CM | POA: Diagnosis not present

## 2015-09-22 DIAGNOSIS — I10 Essential (primary) hypertension: Secondary | ICD-10-CM | POA: Diagnosis not present

## 2015-09-22 DIAGNOSIS — I2510 Atherosclerotic heart disease of native coronary artery without angina pectoris: Secondary | ICD-10-CM | POA: Diagnosis not present

## 2015-09-25 ENCOUNTER — Other Ambulatory Visit: Payer: Self-pay | Admitting: Interventional Cardiology

## 2015-10-30 ENCOUNTER — Other Ambulatory Visit: Payer: Self-pay | Admitting: Interventional Cardiology

## 2015-12-14 DIAGNOSIS — M5136 Other intervertebral disc degeneration, lumbar region: Secondary | ICD-10-CM | POA: Diagnosis not present

## 2016-01-05 ENCOUNTER — Other Ambulatory Visit: Payer: Self-pay | Admitting: Interventional Cardiology

## 2016-01-24 DIAGNOSIS — I2510 Atherosclerotic heart disease of native coronary artery without angina pectoris: Secondary | ICD-10-CM | POA: Diagnosis not present

## 2016-01-24 DIAGNOSIS — I10 Essential (primary) hypertension: Secondary | ICD-10-CM | POA: Diagnosis not present

## 2016-01-24 DIAGNOSIS — E1165 Type 2 diabetes mellitus with hyperglycemia: Secondary | ICD-10-CM | POA: Diagnosis not present

## 2016-01-24 DIAGNOSIS — I5030 Unspecified diastolic (congestive) heart failure: Secondary | ICD-10-CM | POA: Diagnosis not present

## 2016-01-24 DIAGNOSIS — Z7984 Long term (current) use of oral hypoglycemic drugs: Secondary | ICD-10-CM | POA: Diagnosis not present

## 2016-01-24 DIAGNOSIS — E7800 Pure hypercholesterolemia, unspecified: Secondary | ICD-10-CM | POA: Diagnosis not present

## 2016-01-24 DIAGNOSIS — M545 Low back pain: Secondary | ICD-10-CM | POA: Diagnosis not present

## 2016-02-02 DIAGNOSIS — Z4889 Encounter for other specified surgical aftercare: Secondary | ICD-10-CM | POA: Diagnosis not present

## 2016-02-02 DIAGNOSIS — Z4789 Encounter for other orthopedic aftercare: Secondary | ICD-10-CM | POA: Diagnosis not present

## 2016-02-09 DIAGNOSIS — M5136 Other intervertebral disc degeneration, lumbar region: Secondary | ICD-10-CM | POA: Diagnosis not present

## 2016-02-09 DIAGNOSIS — Z4789 Encounter for other orthopedic aftercare: Secondary | ICD-10-CM | POA: Diagnosis not present

## 2016-02-15 DIAGNOSIS — S335XXD Sprain of ligaments of lumbar spine, subsequent encounter: Secondary | ICD-10-CM | POA: Diagnosis not present

## 2016-02-15 DIAGNOSIS — M4806 Spinal stenosis, lumbar region: Secondary | ICD-10-CM | POA: Diagnosis not present

## 2016-02-17 DIAGNOSIS — Z4789 Encounter for other orthopedic aftercare: Secondary | ICD-10-CM | POA: Diagnosis not present

## 2016-03-16 DIAGNOSIS — S335XXD Sprain of ligaments of lumbar spine, subsequent encounter: Secondary | ICD-10-CM | POA: Diagnosis not present

## 2016-05-28 DIAGNOSIS — I10 Essential (primary) hypertension: Secondary | ICD-10-CM | POA: Diagnosis not present

## 2016-05-28 DIAGNOSIS — Z7984 Long term (current) use of oral hypoglycemic drugs: Secondary | ICD-10-CM | POA: Diagnosis not present

## 2016-05-28 DIAGNOSIS — I5030 Unspecified diastolic (congestive) heart failure: Secondary | ICD-10-CM | POA: Diagnosis not present

## 2016-05-28 DIAGNOSIS — E7800 Pure hypercholesterolemia, unspecified: Secondary | ICD-10-CM | POA: Diagnosis not present

## 2016-05-28 DIAGNOSIS — I2510 Atherosclerotic heart disease of native coronary artery without angina pectoris: Secondary | ICD-10-CM | POA: Diagnosis not present

## 2016-05-28 DIAGNOSIS — E1165 Type 2 diabetes mellitus with hyperglycemia: Secondary | ICD-10-CM | POA: Diagnosis not present

## 2016-06-22 DIAGNOSIS — S0181XA Laceration without foreign body of other part of head, initial encounter: Secondary | ICD-10-CM | POA: Diagnosis not present

## 2016-06-22 DIAGNOSIS — S63602A Unspecified sprain of left thumb, initial encounter: Secondary | ICD-10-CM | POA: Diagnosis not present

## 2016-06-22 DIAGNOSIS — Z23 Encounter for immunization: Secondary | ICD-10-CM | POA: Diagnosis not present

## 2016-06-22 DIAGNOSIS — W19XXXA Unspecified fall, initial encounter: Secondary | ICD-10-CM | POA: Diagnosis not present

## 2016-06-22 DIAGNOSIS — S60012A Contusion of left thumb without damage to nail, initial encounter: Secondary | ICD-10-CM | POA: Diagnosis not present

## 2016-10-16 ENCOUNTER — Encounter: Payer: Self-pay | Admitting: Interventional Cardiology

## 2016-10-16 DIAGNOSIS — I2510 Atherosclerotic heart disease of native coronary artery without angina pectoris: Secondary | ICD-10-CM | POA: Diagnosis not present

## 2016-10-16 DIAGNOSIS — Z8546 Personal history of malignant neoplasm of prostate: Secondary | ICD-10-CM | POA: Diagnosis not present

## 2016-10-16 DIAGNOSIS — Z1389 Encounter for screening for other disorder: Secondary | ICD-10-CM | POA: Diagnosis not present

## 2016-10-16 DIAGNOSIS — I10 Essential (primary) hypertension: Secondary | ICD-10-CM | POA: Diagnosis not present

## 2016-10-16 DIAGNOSIS — Z7984 Long term (current) use of oral hypoglycemic drugs: Secondary | ICD-10-CM | POA: Diagnosis not present

## 2016-10-16 DIAGNOSIS — E1165 Type 2 diabetes mellitus with hyperglycemia: Secondary | ICD-10-CM | POA: Diagnosis not present

## 2016-10-16 DIAGNOSIS — Z0000 Encounter for general adult medical examination without abnormal findings: Secondary | ICD-10-CM | POA: Diagnosis not present

## 2016-10-16 DIAGNOSIS — E7800 Pure hypercholesterolemia, unspecified: Secondary | ICD-10-CM | POA: Diagnosis not present

## 2016-11-10 DIAGNOSIS — B029 Zoster without complications: Secondary | ICD-10-CM | POA: Diagnosis not present

## 2016-11-21 DIAGNOSIS — E119 Type 2 diabetes mellitus without complications: Secondary | ICD-10-CM | POA: Diagnosis not present

## 2016-11-21 DIAGNOSIS — H16223 Keratoconjunctivitis sicca, not specified as Sjogren's, bilateral: Secondary | ICD-10-CM | POA: Diagnosis not present

## 2016-11-21 DIAGNOSIS — Z961 Presence of intraocular lens: Secondary | ICD-10-CM | POA: Diagnosis not present

## 2016-11-24 DIAGNOSIS — I447 Left bundle-branch block, unspecified: Secondary | ICD-10-CM | POA: Insufficient documentation

## 2016-11-24 DIAGNOSIS — R001 Bradycardia, unspecified: Secondary | ICD-10-CM | POA: Insufficient documentation

## 2016-11-24 NOTE — Progress Notes (Signed)
Cardiology Office Note    Date:  11/26/2016   ID:  Ryan Hunt, DOB 1930-11-17, MRN [id number]  PCP:  Seward Carol, MD  Cardiologist: Sinclair Grooms, MD   Chief Complaint  Patient presents with  . Coronary Artery Disease    Circumflex stent 2010    History of Present Illness:  Ryan Hunt is a 83 y.o. male with h/o dyspnea on exertion, LBBB, CAD with DE stent circumflex, essential hypertension, hyperlipidemia, and diastolic heart failure.   Overall, there is some shortness of breath with activity, progressive over the past year. There is no orthopnea. Walking 200 feet to his mailbox causes significant shortness of breath. No significant lower extremity swelling. No chest pain. Prior to circumflex stenting, dyspnea was the major complaint. He denies chest pain.  Past Medical History:  Diagnosis Date  . Arthritis    "knees"  . Bronchitis 11/2011   "first time for me"  . CAD (coronary artery disease)    With DES circumflex 02/2009  . Coronary atherosclerosis of native coronary artery   . Diabetes mellitus with kidney disease ([place])   . Diastolic heart failure ([place])   . Diverticulosis of colon (without mention of hemorrhage)   . Exertional dyspnea    Chronic  . GERD (gastroesophageal reflux disease)   . History of peptic ulcer disease    Remote history  . Hypercholesterolemia   . Hypertension   . Kidney stone    "dr told me my kidney was loaded w/stones; imbedded"  . Labyrinthitis   . Leg edema   . Meralgia paresthetica   . Mixed hyperlipidemia   . Osteoarthrosis, unspecified whether generalized or localized, lower leg   . Palpitations   . Prostate cancer [facility])    s/p prostatectomy  . Type II diabetes mellitus (HCC)    dx 3 yrs ago    Past Surgical History:  Procedure Laterality Date  . CARDIAC CATHETERIZATION    . CATARACT EXTRACTION W/ INTRAOCULAR LENS IMPLANT & ANTERIOR VITRECTOMY, BILATERAL  1990's  . COLONOSCOPY    . CORONARY ANGIOPLASTY WITH  STENT PLACEMENT  02/2009   Stent x 1 (DES circumflex)  . EYE SURGERY     bil lens implants  . JOINT REPLACEMENT     s/p left knee  . LASIK Bilateral   . LUMBAR LAMINECTOMY/DECOMPRESSION MICRODISCECTOMY N/A 12/23/2014   Procedure:  L2-3 DECOMPRESSION;  Surgeon: Melina Schools, MD;  Location: [facility];  Service: Orthopedics;  Laterality: N/A;  . PROSTATECTOMY  2009   Robotic prostatectomy   . Skin Lumps Removed    . TOTAL KNEE ARTHROPLASTY  12/31/2011   Procedure: TOTAL KNEE ARTHROPLASTY;  Surgeon: Lorn Junes, MD;  Location: [facility];  Service: Orthopedics;  Laterality: Left;  Left total knee arthroplasty  . TOTAL SHOULDER REPLACEMENT  1990's   bilaterally    Current Medications:   Allergies as of 11/26/2016      Reactions   Amoxicillin Hives, Other (See Comments)   "sores in my mouth"   Oxycodone Hcl    agitation   Simvastatin    itching      Medication List       Accurate as of 11/26/16  9:09 AM. Always use your most recent med list.          aspirin EC 81 MG tablet Take 1 tablet (81 mg total) by mouth daily.   Biotin 1000 MCG tablet Take 1,000 mcg by mouth daily.   docusate calcium 240  MG capsule Commonly known as:  SURFAK Take 240 mg by mouth 3 (three) times daily as needed for mild constipation.   furosemide 20 MG tablet Commonly known as:  LASIX Take 20 mg by mouth daily.   glimepiride 2 MG tablet Commonly known as:  AMARYL Take 2 mg by mouth 2 (two) times daily.   lisinopril 20 MG tablet Commonly known as:  PRINIVIL,ZESTRIL TAKE ONE TABLET BY MOUTH ONCE DAILY   metFORMIN 1000 MG tablet Commonly known as:  GLUCOPHAGE Take 1,000 mg by mouth 2 (two) times daily with a meal.   nitroGLYCERIN 0.4 MG SL tablet Commonly known as:  NITROSTAT Place 0.4 mg under the tongue every 5 (five) minutes as needed. For chest pain   rosuvastatin 10 MG tablet Commonly known as:  CRESTOR Take 10 mg by mouth daily.   traMADol 50 MG tablet Commonly known as:   ULTRAM Take 50 mg by mouth every 12 (twelve) hours as needed for moderate pain.        Allergies:   Amoxicillin; Oxycodone hcl; and Simvastatin   Social History   Social History  . Marital status: Married    Spouse name: N/A  . Number of children: N/A  . Years of education: N/A   Occupational History  . Retired    Social History Main Topics  . Smoking status: Former Smoker    Packs/day: 0.50    Years: 15.00    Types: Cigarettes    Quit date: 07/09/1973  . Smokeless tobacco: Never Used  . Alcohol use 0.0 oz/week     Comment: 01/01/12 "occassionall have a glass of beer or wine; not even once a week"  . Drug use: No  . Sexual activity: No   Other Topics Concern  . None   Social History Narrative  . None     Family History:  The patient's family history includes CAD in his father; COPD in his brother; CVA in his father and mother; Dementia in his mother; Heart disease in his father; Nephritis in his brother; Prostate cancer in his father; Stroke in his father; Tremor in his mother.   ROS:   Please see the history of present illness.    Some excessive fatigue, rash due to recent shingles, back discomfort, easy bruising.  All other systems reviewed and are negative.   PHYSICAL EXAM:   VS:  BP (!) 154/84 (BP Location: Right Arm)   Pulse 63   Ht 5\' 7"  (1.702 m)   Wt 217 lb 12.8 oz (98.8 kg)   BMI 34.11 kg/m    GEN: Well nourished, well developed, in no acute distress  HEENT: normal  Neck: no JVD, carotid bruits, or masses Cardiac: RRR; no murmurs, rubs, or gallops,no edema  Respiratory:  clear to auscultation bilaterally, normal work of breathing GI: soft, nontender, nondistended, + BS MS: no deformity or atrophy  Skin: warm and dry, no rash Neuro:  Alert and Oriented x 3, Strength and sensation are intact Psych: euthymic mood, full affect  Wt Readings from Last 3 Encounters:  11/26/16 217 lb 12.8 oz (98.8 kg)  12/23/14 215 lb (97.5 kg)  12/22/14 215 lb 6.4 oz  (97.7 kg)      Studies/Labs Reviewed:   EKG:  EKG  Left bundle branch block, first-degree AV block, occasional PVCs/PACs and when compared to prior tracings, the premature beats are new.  Recent Labs: No results found for requested labs within last 8760 hours.   Lipid Panel No results found  for: CHOL, TRIG, HDL, CHOLHDL, VLDL, LDLCALC, LDLDIRECT  Additional studies/ records that were reviewed today include:  Reviewed records from [place] that demonstrated a hemoglobin A1c of 7.2 LDL cholesterol of 96 BUN and creatinine of 20 and 1.06. Platelet count is 104,000 on laboratory data obtained 10/16/16    ASSESSMENT:    1. DOE (dyspnea on exertion)   2. Coronary artery disease involving native coronary artery of native heart without angina pectoris   3. Essential hypertension   4. Hypercholesterolemia   5. Diabetes mellitus type 2 in obese ([place])   6. LBBB (left bundle branch block)   7. Bradycardia   8. SOB (shortness of breath)      PLAN:  In order of problems listed above:  1. Could be an anginal equivalent. We will check a BNP, consider up titration of diuretic therapy, and perform a Lexiscan Myoview to assess for ischemia and get a sense of LV function. We'll also get an echocardiogram since LV function by nuclear scintigraphy as often error prone. Decrease aspirin to 81 mg per day. 2. No chest discomfort. Prior anginal equivalent was dyspnea. See evaluation above. 3. 2 g sodium diet. Increase in diuretic therapy may be important with reference to dyspnea. We will await BNP results before up titrating diuretic since he already complains of polyuria. 4. LDL less than 70 5. Followed by PCP, Dr. Delfina Redwood. Last hemoglobin A1c was 7.2. 6. Noted 7. Noted. If nothing turns up from the ischemic evaluation and LV function is normal, we need to consider chronotropic incompetence as an explanation for his current symptoms.    Medication Adjustments/Labs and Tests Ordered: Current  medicines are reviewed at length with the patient today.  Concerns regarding medicines are outlined above.  Medication changes, Labs and Tests ordered today are listed in the Patient Instructions below. Patient Instructions  Medication Instructions:  1) Decrease Aspirin to 81mg  once daily  Labwork: Pro BNP today  Testing/Procedures: Your physician has requested that you have an echocardiogram. Echocardiography is a painless test that uses sound waves to create images of your heart. It provides your doctor with information about the size and shape of your heart and how well your heart's chambers and valves are working. This procedure takes approximately one hour. There are no restrictions for this procedure.  Your physician has requested that you have a lexiscan myoview. For further information please visit [email or web address]. Please follow instruction sheet, as given.   Follow-Up: Your physician wants you to follow-up in: 1 year with Dr. Tamala Julian.  You will receive a reminder letter in the mail two months in advance. If you don't receive a letter, please call our office to schedule the follow-up appointment.   Any Other Special Instructions Will Be Listed Below (If Applicable).     If you need a refill on your cardiac medications before your next appointment, please call your pharmacy.      Signed, Sinclair Grooms, MD  11/26/2016 9:03 AM    [facility] [address], [place], [place]  [address] Phone: [phone]; Fax: [phone]

## 2016-11-26 ENCOUNTER — Encounter: Payer: Self-pay | Admitting: Interventional Cardiology

## 2016-11-26 ENCOUNTER — Ambulatory Visit (INDEPENDENT_AMBULATORY_CARE_PROVIDER_SITE_OTHER): Payer: PPO | Admitting: Interventional Cardiology

## 2016-11-26 VITALS — BP 154/84 | HR 63 | Ht 67.0 in | Wt 217.8 lb

## 2016-11-26 DIAGNOSIS — E669 Obesity, unspecified: Secondary | ICD-10-CM

## 2016-11-26 DIAGNOSIS — E1169 Type 2 diabetes mellitus with other specified complication: Secondary | ICD-10-CM

## 2016-11-26 DIAGNOSIS — I447 Left bundle-branch block, unspecified: Secondary | ICD-10-CM | POA: Diagnosis not present

## 2016-11-26 DIAGNOSIS — I2510 Atherosclerotic heart disease of native coronary artery without angina pectoris: Secondary | ICD-10-CM

## 2016-11-26 DIAGNOSIS — R001 Bradycardia, unspecified: Secondary | ICD-10-CM

## 2016-11-26 DIAGNOSIS — R0609 Other forms of dyspnea: Secondary | ICD-10-CM | POA: Diagnosis not present

## 2016-11-26 DIAGNOSIS — I10 Essential (primary) hypertension: Secondary | ICD-10-CM

## 2016-11-26 DIAGNOSIS — E7800 Pure hypercholesterolemia, unspecified: Secondary | ICD-10-CM | POA: Diagnosis not present

## 2016-11-26 DIAGNOSIS — R0602 Shortness of breath: Secondary | ICD-10-CM | POA: Diagnosis not present

## 2016-11-26 LAB — PRO B NATRIURETIC PEPTIDE: NT-Pro BNP: 116 pg/mL (ref 0–486)

## 2016-11-26 MED ORDER — ASPIRIN EC 81 MG PO TBEC: 81.0000 mg | DELAYED_RELEASE_TABLET | Freq: Every day | ORAL | 3 refills | Status: DC

## 2016-11-26 NOTE — Patient Instructions (Signed)
Medication Instructions:  1) Decrease Aspirin to 81mg  once daily  Labwork: Pro BNP today  Testing/Procedures: Your physician has requested that you have an echocardiogram. Echocardiography is a painless test that uses sound waves to create images of your heart. It provides your doctor with information about the size and shape of your heart and how well your heart's chambers and valves are working. This procedure takes approximately one hour. There are no restrictions for this procedure.  Your physician has requested that you have a lexiscan myoview. For further information please visit [email or web address]. Please follow instruction sheet, as given.   Follow-Up: Your physician wants you to follow-up in: 1 year with Dr. Tamala Julian.  You will receive a reminder letter in the mail two months in advance. If you don't receive a letter, please call our office to schedule the follow-up appointment.   Any Other Special Instructions Will Be Listed Below (If Applicable).     If you need a refill on your cardiac medications before your next appointment, please call your pharmacy.

## 2016-12-04 ENCOUNTER — Telehealth (HOSPITAL_COMMUNITY): Payer: Self-pay | Admitting: *Deleted

## 2016-12-04 NOTE — Telephone Encounter (Signed)
Left message on voicemail in reference to upcoming appointment scheduled for 12/06/16. Phone number given for a call back so details instructions can be given. Lyana Asbill, Ranae Palms

## 2016-12-05 ENCOUNTER — Telehealth (HOSPITAL_COMMUNITY): Payer: Self-pay

## 2016-12-05 NOTE — Telephone Encounter (Signed)
Patient given detailed instructions per Myocardial Perfusion Study Information Sheet for the test on 12/06/2016 at 7:45. Patient notified to arrive 15 minutes early and that it is imperative to arrive on time for appointment to keep from having the test rescheduled.  If you need to cancel or reschedule your appointment, please call the office within 24 hours of your appointment. . Patient verbalized understanding.EHK

## 2016-12-06 ENCOUNTER — Ambulatory Visit (HOSPITAL_COMMUNITY): Payer: PPO | Attending: Interventional Cardiology

## 2016-12-06 ENCOUNTER — Ambulatory Visit (HOSPITAL_BASED_OUTPATIENT_CLINIC_OR_DEPARTMENT_OTHER): Payer: PPO

## 2016-12-06 DIAGNOSIS — R0602 Shortness of breath: Secondary | ICD-10-CM | POA: Diagnosis not present

## 2016-12-06 DIAGNOSIS — I517 Cardiomegaly: Secondary | ICD-10-CM | POA: Insufficient documentation

## 2016-12-06 DIAGNOSIS — I5030 Unspecified diastolic (congestive) heart failure: Secondary | ICD-10-CM | POA: Diagnosis not present

## 2016-12-06 LAB — MYOCARDIAL PERFUSION IMAGING
CHL CUP NUCLEAR SRS: 4
CHL CUP RESTING HR STRESS: 53 {beats}/min
LV dias vol: 108 mL (ref 62–150)
LVSYSVOL: 38 mL
NUC STRESS TID: 1.03
Peak HR: 63 {beats}/min
RATE: 0.26
SDS: 2
SSS: 6

## 2016-12-06 MED ORDER — REGADENOSON 0.4 MG/5ML IV SOLN
0.4000 mg | Freq: Once | INTRAVENOUS | Status: AC
  Administered 2016-12-06: 0.4 mg via INTRAVENOUS

## 2016-12-06 MED ORDER — TECHNETIUM TC 99M TETROFOSMIN IV KIT
32.9000 | PACK | Freq: Once | INTRAVENOUS | Status: AC | PRN
  Administered 2016-12-06: 32.9 via INTRAVENOUS
  Filled 2016-12-06: qty 33

## 2016-12-06 MED ORDER — TECHNETIUM TC 99M TETROFOSMIN IV KIT
10.9000 | PACK | Freq: Once | INTRAVENOUS | Status: AC | PRN
  Administered 2016-12-06: 10.9 via INTRAVENOUS
  Filled 2016-12-06: qty 11

## 2016-12-10 ENCOUNTER — Telehealth: Payer: Self-pay | Admitting: Interventional Cardiology

## 2016-12-10 DIAGNOSIS — R0609 Other forms of dyspnea: Principal | ICD-10-CM

## 2016-12-10 NOTE — Telephone Encounter (Signed)
LMTCB

## 2016-12-10 NOTE — Telephone Encounter (Signed)
New message     Pt is returning Webb Silversmith call for results

## 2016-12-10 NOTE — Telephone Encounter (Signed)
Notes recorded by Belva Crome, MD on 12/07/2016 at 3:52 PM EDT Both stress test and echo look okay. Have him wear a 48 hr monitor to evaluate chronotropic competence.  12/10/16--discussed with pt, pt agreed to 48 monitor, will forward message to [facility] to contact pt to schedule.

## 2016-12-10 NOTE — Telephone Encounter (Signed)
New Message     Pt was calling to get his results from Echo and treadmill test

## 2016-12-25 ENCOUNTER — Other Ambulatory Visit: Payer: Self-pay | Admitting: Interventional Cardiology

## 2016-12-25 ENCOUNTER — Ambulatory Visit (INDEPENDENT_AMBULATORY_CARE_PROVIDER_SITE_OTHER): Payer: PPO

## 2016-12-25 DIAGNOSIS — I493 Ventricular premature depolarization: Secondary | ICD-10-CM | POA: Diagnosis not present

## 2016-12-25 DIAGNOSIS — R0609 Other forms of dyspnea: Principal | ICD-10-CM

## 2016-12-25 DIAGNOSIS — I440 Atrioventricular block, first degree: Secondary | ICD-10-CM | POA: Diagnosis not present

## 2016-12-25 DIAGNOSIS — I491 Atrial premature depolarization: Secondary | ICD-10-CM | POA: Diagnosis not present

## 2016-12-25 DIAGNOSIS — R001 Bradycardia, unspecified: Secondary | ICD-10-CM

## 2016-12-25 DIAGNOSIS — I447 Left bundle-branch block, unspecified: Secondary | ICD-10-CM | POA: Diagnosis not present

## 2016-12-31 ENCOUNTER — Telehealth: Payer: Self-pay

## 2016-12-31 NOTE — Telephone Encounter (Signed)
Received a call from [name] monitors.Patient wore a recent 24 hour monitor.[facility] having computer issues.They are unable to send monitor as normal.They will fax report for Dr.Smith to review.

## 2017-01-04 ENCOUNTER — Telehealth: Payer: Self-pay | Admitting: Interventional Cardiology

## 2017-01-04 NOTE — Telephone Encounter (Signed)
Faxed copy of holter monitor received and reviewed by Dr. Tamala Julian.  Dr. Tamala Julian said: 1) Basic rhythm is NSR 2) Avg HR 65BPM.  No activity related. HR > 95BPM. 3) Fastest HR was run of SVT at 123BPM 4) No Afib 5) Slowest HR <39BPM  Spoke with pt and went over results per Dr. Tamala Julian.  Pt verbalized understanding and was appreciative for call.

## 2017-01-08 ENCOUNTER — Telehealth: Payer: Self-pay | Admitting: Interventional Cardiology

## 2017-01-08 NOTE — Telephone Encounter (Signed)
Follow up   Pt calling Anderson Malta back

## 2017-01-08 NOTE — Telephone Encounter (Signed)
Spoke with pt and went over monitor results.  Pt verbalized understanding.

## 2017-01-16 DIAGNOSIS — Z961 Presence of intraocular lens: Secondary | ICD-10-CM | POA: Diagnosis not present

## 2017-01-16 DIAGNOSIS — E119 Type 2 diabetes mellitus without complications: Secondary | ICD-10-CM | POA: Diagnosis not present

## 2017-01-16 DIAGNOSIS — H16223 Keratoconjunctivitis sicca, not specified as Sjogren's, bilateral: Secondary | ICD-10-CM | POA: Diagnosis not present

## 2017-01-16 DIAGNOSIS — R0600 Dyspnea, unspecified: Secondary | ICD-10-CM | POA: Diagnosis not present

## 2017-04-18 DIAGNOSIS — I2510 Atherosclerotic heart disease of native coronary artery without angina pectoris: Secondary | ICD-10-CM | POA: Diagnosis not present

## 2017-04-18 DIAGNOSIS — E7800 Pure hypercholesterolemia, unspecified: Secondary | ICD-10-CM | POA: Diagnosis not present

## 2017-04-18 DIAGNOSIS — I10 Essential (primary) hypertension: Secondary | ICD-10-CM | POA: Diagnosis not present

## 2017-04-18 DIAGNOSIS — E1165 Type 2 diabetes mellitus with hyperglycemia: Secondary | ICD-10-CM | POA: Diagnosis not present

## 2017-10-18 DIAGNOSIS — Z1389 Encounter for screening for other disorder: Secondary | ICD-10-CM | POA: Diagnosis not present

## 2017-10-18 DIAGNOSIS — I2510 Atherosclerotic heart disease of native coronary artery without angina pectoris: Secondary | ICD-10-CM | POA: Diagnosis not present

## 2017-10-18 DIAGNOSIS — I10 Essential (primary) hypertension: Secondary | ICD-10-CM | POA: Diagnosis not present

## 2017-10-18 DIAGNOSIS — Z0000 Encounter for general adult medical examination without abnormal findings: Secondary | ICD-10-CM | POA: Diagnosis not present

## 2017-10-18 DIAGNOSIS — E1165 Type 2 diabetes mellitus with hyperglycemia: Secondary | ICD-10-CM | POA: Diagnosis not present

## 2017-10-18 DIAGNOSIS — E7800 Pure hypercholesterolemia, unspecified: Secondary | ICD-10-CM | POA: Diagnosis not present

## 2017-12-03 DIAGNOSIS — I10 Essential (primary) hypertension: Secondary | ICD-10-CM | POA: Diagnosis not present

## 2017-12-03 DIAGNOSIS — C61 Malignant neoplasm of prostate: Secondary | ICD-10-CM | POA: Diagnosis not present

## 2017-12-03 DIAGNOSIS — I5030 Unspecified diastolic (congestive) heart failure: Secondary | ICD-10-CM | POA: Diagnosis not present

## 2017-12-03 DIAGNOSIS — E1165 Type 2 diabetes mellitus with hyperglycemia: Secondary | ICD-10-CM | POA: Diagnosis not present

## 2017-12-03 DIAGNOSIS — Z8546 Personal history of malignant neoplasm of prostate: Secondary | ICD-10-CM | POA: Diagnosis not present

## 2017-12-03 DIAGNOSIS — E785 Hyperlipidemia, unspecified: Secondary | ICD-10-CM | POA: Diagnosis not present

## 2017-12-03 DIAGNOSIS — I2510 Atherosclerotic heart disease of native coronary artery without angina pectoris: Secondary | ICD-10-CM | POA: Diagnosis not present

## 2017-12-03 DIAGNOSIS — F4321 Adjustment disorder with depressed mood: Secondary | ICD-10-CM | POA: Diagnosis not present

## 2018-01-02 DIAGNOSIS — I5030 Unspecified diastolic (congestive) heart failure: Secondary | ICD-10-CM | POA: Diagnosis not present

## 2018-01-02 DIAGNOSIS — I2510 Atherosclerotic heart disease of native coronary artery without angina pectoris: Secondary | ICD-10-CM | POA: Diagnosis not present

## 2018-01-02 DIAGNOSIS — C61 Malignant neoplasm of prostate: Secondary | ICD-10-CM | POA: Diagnosis not present

## 2018-01-02 DIAGNOSIS — E785 Hyperlipidemia, unspecified: Secondary | ICD-10-CM | POA: Diagnosis not present

## 2018-01-02 DIAGNOSIS — Z7984 Long term (current) use of oral hypoglycemic drugs: Secondary | ICD-10-CM | POA: Diagnosis not present

## 2018-01-02 DIAGNOSIS — E1165 Type 2 diabetes mellitus with hyperglycemia: Secondary | ICD-10-CM | POA: Diagnosis not present

## 2018-01-02 DIAGNOSIS — F4321 Adjustment disorder with depressed mood: Secondary | ICD-10-CM | POA: Diagnosis not present

## 2018-01-02 DIAGNOSIS — I10 Essential (primary) hypertension: Secondary | ICD-10-CM | POA: Diagnosis not present

## 2018-01-02 DIAGNOSIS — Z8546 Personal history of malignant neoplasm of prostate: Secondary | ICD-10-CM | POA: Diagnosis not present

## 2018-02-05 ENCOUNTER — Ambulatory Visit: Payer: PPO | Admitting: Interventional Cardiology

## 2018-02-11 ENCOUNTER — Encounter: Payer: Self-pay | Admitting: Interventional Cardiology

## 2018-02-18 ENCOUNTER — Ambulatory Visit: Payer: PPO | Admitting: Nurse Practitioner

## 2018-02-19 ENCOUNTER — Ambulatory Visit: Payer: PPO | Admitting: Nurse Practitioner

## 2018-02-19 ENCOUNTER — Encounter: Payer: Self-pay | Admitting: Nurse Practitioner

## 2018-02-19 ENCOUNTER — Telehealth: Payer: Self-pay | Admitting: Interventional Cardiology

## 2018-02-19 VITALS — BP 170/80 | HR 65 | Ht 68.5 in | Wt 225.4 lb

## 2018-02-19 DIAGNOSIS — I447 Left bundle-branch block, unspecified: Secondary | ICD-10-CM

## 2018-02-19 DIAGNOSIS — R0609 Other forms of dyspnea: Secondary | ICD-10-CM | POA: Diagnosis not present

## 2018-02-19 DIAGNOSIS — I2510 Atherosclerotic heart disease of native coronary artery without angina pectoris: Secondary | ICD-10-CM | POA: Diagnosis not present

## 2018-02-19 DIAGNOSIS — R0600 Dyspnea, unspecified: Secondary | ICD-10-CM

## 2018-02-19 MED ORDER — ISOSORBIDE MONONITRATE ER 30 MG PO TB24: 30.0000 mg | ORAL_TABLET | Freq: Every day | ORAL | 3 refills | Status: DC

## 2018-02-19 MED ORDER — FUROSEMIDE 40 MG PO TABS: 40.0000 mg | ORAL_TABLET | Freq: Every day | ORAL | 3 refills | Status: DC
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
## 2018-02-19 NOTE — Telephone Encounter (Signed)
Called patient back about his message. Patient complaining of SOB with activity and it has become worse over the past 2 months. Patient stated he does not have any other symptoms, but he can barely get to the mail box to check his mail.  Informed patient he needs to be evaluated and be seen. Patient is over due for 1 year follow-up with Dr. Tamala Julian. Checked with Truitt Merle NP to see if she could see patient today. She agreed to see patient, and Dr. Tamala Julian is in the office today as well. Will send message to Dr. Tamala Julian as well.

## 2018-02-19 NOTE — Telephone Encounter (Signed)
Pt c/o Shortness Of Breath: STAT if SOB developed within the last 24 hours or pt is noticeably SOB on the phone  1. Are you currently SOB (can you hear that pt is SOB on the phone)? Yes-you can hear it over the phone-pt wants to see anybody for appt  2. How long have you been experiencing SOB?  Really  Been going on for about 2 months, but it have gotten much worse 3. Are you SOB when sitting or when up moving around? All the time, but worse moving around  4. Are you currently experiencing any other symptoms? no

## 2018-02-19 NOTE — Patient Instructions (Addendum)
We will be checking the following labs today - BMET, CBC, BNP and TSH   Medication Instructions:    Continue with your current medicines. BUT  I am increasing the Lasix (Furosemide) to 40 mg a day  I am adding Imdur 30 mg a day - ok to take this at night if it causes a headache.     Testing/Procedures To Be Arranged:  N/A  Follow-Up:   See me or Dr. Tamala Julian in about a month or so.     Other Special Instructions:   Really try to cut back on your salt.     If you need a refill on your cardiac medications before your next appointment, please call your pharmacy.   Call the [place] office at [phone] if you have any questions, problems or concerns.

## 2018-02-20 LAB — PRO B NATRIURETIC PEPTIDE: NT-Pro BNP: 168 pg/mL (ref 0–486)

## 2018-02-20 LAB — BASIC METABOLIC PANEL
BUN/Creatinine Ratio: 14 (ref 10–24)
BUN: 18 mg/dL (ref 8–27)
CO2: 24 mmol/L (ref 20–29)
Calcium: 9.7 mg/dL (ref 8.6–10.2)
Chloride: 102 mmol/L (ref 96–106)
Creatinine, Ser: 1.26 mg/dL (ref 0.76–1.27)
GFR calc Af Amer: 60 mL/min/{1.73_m2} (ref >59)
GFR calc non Af Amer: 52 mL/min/{1.73_m2} — ABNORMAL LOW (ref >59)
Glucose: 205 mg/dL — ABNORMAL HIGH (ref 65–99)
Potassium: 4.6 mmol/L (ref 3.5–5.2)
Sodium: 140 mmol/L (ref 134–144)

## 2018-02-20 LAB — CBC
Hematocrit: 38 % (ref 37.5–51.0)
Hemoglobin: 12.5 g/dL — ABNORMAL LOW (ref 13.0–17.7)
MCH: 29.1 pg (ref 26.6–33.0)
MCHC: 32.9 g/dL (ref 31.5–35.7)
MCV: 89 fL (ref 79–97)
Platelets: 118 10*3/uL — ABNORMAL LOW (ref 150–450)
RBC: 4.29 x10E6/uL (ref 4.14–5.80)
RDW: 14.9 % (ref 12.3–15.4)
WBC: 5 10*3/uL (ref 3.4–10.8)

## 2018-02-20 LAB — TSH: TSH: 0.95 u[IU]/mL (ref 0.450–4.500)

## 2018-02-26 ENCOUNTER — Other Ambulatory Visit: Payer: Self-pay

## 2018-02-26 ENCOUNTER — Inpatient Hospital Stay (HOSPITAL_COMMUNITY)
Admission: EM | Admit: 2018-02-26 | Discharge: 2018-02-28 | DRG: 287 | Disposition: A | Discharge status: Home Health | Payer: PPO | Attending: Cardiology | Admitting: Cardiology

## 2018-02-26 ENCOUNTER — Emergency Department (HOSPITAL_COMMUNITY): Payer: PPO

## 2018-02-26 ENCOUNTER — Encounter (HOSPITAL_COMMUNITY): Payer: Self-pay | Admitting: *Deleted

## 2018-02-26 DIAGNOSIS — Z96612 Presence of left artificial shoulder joint: Secondary | ICD-10-CM | POA: Diagnosis not present

## 2018-02-26 DIAGNOSIS — E119 Type 2 diabetes mellitus without complications: Secondary | ICD-10-CM | POA: Diagnosis not present

## 2018-02-26 DIAGNOSIS — I5032 Chronic diastolic (congestive) heart failure: Secondary | ICD-10-CM | POA: Diagnosis not present

## 2018-02-26 DIAGNOSIS — R000 Tachycardia, unspecified: Secondary | ICD-10-CM | POA: Diagnosis not present

## 2018-02-26 DIAGNOSIS — Z96652 Presence of left artificial knee joint: Secondary | ICD-10-CM | POA: Diagnosis not present

## 2018-02-26 DIAGNOSIS — I130 Hypertensive heart and chronic kidney disease with heart failure and stage 1 through stage 4 chronic kidney disease, or unspecified chronic kidney disease: Secondary | ICD-10-CM | POA: Diagnosis present

## 2018-02-26 DIAGNOSIS — Z87891 Personal history of nicotine dependence: Secondary | ICD-10-CM

## 2018-02-26 DIAGNOSIS — R0789 Other chest pain: Secondary | ICD-10-CM | POA: Diagnosis not present

## 2018-02-26 DIAGNOSIS — I25118 Atherosclerotic heart disease of native coronary artery with other forms of angina pectoris: Secondary | ICD-10-CM | POA: Diagnosis not present

## 2018-02-26 DIAGNOSIS — I200 Unstable angina: Secondary | ICD-10-CM | POA: Diagnosis present

## 2018-02-26 DIAGNOSIS — I2510 Atherosclerotic heart disease of native coronary artery without angina pectoris: Secondary | ICD-10-CM | POA: Diagnosis not present

## 2018-02-26 DIAGNOSIS — Z23 Encounter for immunization: Secondary | ICD-10-CM

## 2018-02-26 DIAGNOSIS — E1165 Type 2 diabetes mellitus with hyperglycemia: Secondary | ICD-10-CM | POA: Diagnosis present

## 2018-02-26 DIAGNOSIS — E782 Mixed hyperlipidemia: Secondary | ICD-10-CM | POA: Diagnosis not present

## 2018-02-26 DIAGNOSIS — R0609 Other forms of dyspnea: Secondary | ICD-10-CM | POA: Diagnosis not present

## 2018-02-26 DIAGNOSIS — Z961 Presence of intraocular lens: Secondary | ICD-10-CM | POA: Diagnosis present

## 2018-02-26 DIAGNOSIS — Z955 Presence of coronary angioplasty implant and graft: Secondary | ICD-10-CM | POA: Diagnosis not present

## 2018-02-26 DIAGNOSIS — Z7982 Long term (current) use of aspirin: Secondary | ICD-10-CM | POA: Diagnosis not present

## 2018-02-26 DIAGNOSIS — R079 Chest pain, unspecified: Secondary | ICD-10-CM | POA: Diagnosis present

## 2018-02-26 DIAGNOSIS — Z96611 Presence of right artificial shoulder joint: Secondary | ICD-10-CM | POA: Diagnosis present

## 2018-02-26 DIAGNOSIS — M170 Bilateral primary osteoarthritis of knee: Secondary | ICD-10-CM | POA: Diagnosis present

## 2018-02-26 DIAGNOSIS — Z9842 Cataract extraction status, left eye: Secondary | ICD-10-CM | POA: Diagnosis not present

## 2018-02-26 DIAGNOSIS — Z888 Allergy status to other drugs, medicaments and biological substances status: Secondary | ICD-10-CM

## 2018-02-26 DIAGNOSIS — Z885 Allergy status to narcotic agent status: Secondary | ICD-10-CM

## 2018-02-26 DIAGNOSIS — Z7984 Long term (current) use of oral hypoglycemic drugs: Secondary | ICD-10-CM | POA: Diagnosis not present

## 2018-02-26 DIAGNOSIS — I213 ST elevation (STEMI) myocardial infarction of unspecified site: Secondary | ICD-10-CM | POA: Diagnosis not present

## 2018-02-26 DIAGNOSIS — I252 Old myocardial infarction: Secondary | ICD-10-CM

## 2018-02-26 DIAGNOSIS — Z79899 Other long term (current) drug therapy: Secondary | ICD-10-CM | POA: Diagnosis not present

## 2018-02-26 DIAGNOSIS — N183 Chronic kidney disease, stage 3 (moderate): Secondary | ICD-10-CM | POA: Diagnosis not present

## 2018-02-26 DIAGNOSIS — R001 Bradycardia, unspecified: Secondary | ICD-10-CM | POA: Diagnosis not present

## 2018-02-26 DIAGNOSIS — E1122 Type 2 diabetes mellitus with diabetic chronic kidney disease: Secondary | ICD-10-CM | POA: Diagnosis present

## 2018-02-26 DIAGNOSIS — I447 Left bundle-branch block, unspecified: Secondary | ICD-10-CM | POA: Diagnosis present

## 2018-02-26 DIAGNOSIS — I10 Essential (primary) hypertension: Secondary | ICD-10-CM | POA: Diagnosis not present

## 2018-02-26 DIAGNOSIS — I25110 Atherosclerotic heart disease of native coronary artery with unstable angina pectoris: Secondary | ICD-10-CM | POA: Diagnosis not present

## 2018-02-26 DIAGNOSIS — R0689 Other abnormalities of breathing: Secondary | ICD-10-CM | POA: Diagnosis not present

## 2018-02-26 DIAGNOSIS — R0902 Hypoxemia: Secondary | ICD-10-CM | POA: Diagnosis not present

## 2018-02-26 DIAGNOSIS — Z9841 Cataract extraction status, right eye: Secondary | ICD-10-CM | POA: Diagnosis not present

## 2018-02-26 HISTORY — DX: Angina pectoris, unspecified: I20.9

## 2018-02-26 HISTORY — DX: Heart failure, unspecified: I50.9

## 2018-02-26 HISTORY — DX: Personal history of urinary calculi: Z87.442

## 2018-02-26 HISTORY — DX: Anemia, unspecified: D64.9

## 2018-02-26 HISTORY — DX: Dyspnea, unspecified: R06.00

## 2018-02-26 LAB — BASIC METABOLIC PANEL
Anion gap: 10 (ref 5–15)
BUN: 27 mg/dL — ABNORMAL HIGH (ref 8–23)
CHLORIDE: 105 mmol/L (ref 98–111)
CO2: 25 mmol/L (ref 22–32)
CREATININE: 1.57 mg/dL — AB (ref 0.61–1.24)
Calcium: 10.2 mg/dL (ref 8.9–10.3)
GFR calc non Af Amer: 39 mL/min — ABNORMAL LOW (ref >60)
GFR, EST AFRICAN AMERICAN: 45 mL/min — AB (ref >60)
Glucose, Bld: 187 mg/dL — ABNORMAL HIGH (ref 70–99)
POTASSIUM: 4.8 mmol/L (ref 3.5–5.1)
SODIUM: 140 mmol/L (ref 135–145)

## 2018-02-26 LAB — CBC
HCT: 39.3 % (ref 39.0–52.0)
Hemoglobin: 12.6 g/dL — ABNORMAL LOW (ref 13.0–17.0)
MCH: 29.4 pg (ref 26.0–34.0)
MCHC: 32.1 g/dL (ref 30.0–36.0)
MCV: 91.6 fL (ref 78.0–100.0)
PLATELETS: 116 10*3/uL — AB (ref 150–400)
RBC: 4.29 MIL/uL (ref 4.22–5.81)
RDW: 14 % (ref 11.5–15.5)
WBC: 5.9 10*3/uL (ref 4.0–10.5)

## 2018-02-26 LAB — HEPATIC FUNCTION PANEL
ALBUMIN: 3.7 g/dL (ref 3.5–5.0)
ALT: 32 U/L (ref 0–44)
AST: 45 U/L — AB (ref 15–41)
Alkaline Phosphatase: 52 U/L (ref 38–126)
Bilirubin, Direct: 0.2 mg/dL (ref 0.0–0.2)
Indirect Bilirubin: 0.8 mg/dL (ref 0.3–0.9)
Total Bilirubin: 1 mg/dL (ref 0.3–1.2)
Total Protein: 7.2 g/dL (ref 6.5–8.1)

## 2018-02-26 LAB — CBG MONITORING, ED: Glucose-Capillary: 200 mg/dL — ABNORMAL HIGH (ref 70–99)

## 2018-02-26 LAB — I-STAT TROPONIN, ED: Troponin i, poc: 0 ng/mL (ref 0.00–0.08)

## 2018-02-26 LAB — MAGNESIUM: Magnesium: 2 mg/dL (ref 1.7–2.4)

## 2018-02-26 LAB — T4, FREE: FREE T4: 0.87 ng/dL (ref 0.82–1.77)

## 2018-02-26 LAB — HEMOGLOBIN A1C
HEMOGLOBIN A1C: 8.4 % — AB (ref 4.8–5.6)
Mean Plasma Glucose: 194.38 mg/dL

## 2018-02-26 LAB — TSH: TSH: 0.95 u[IU]/mL (ref 0.350–4.500)

## 2018-02-26 LAB — TROPONIN I: Troponin I: <0.03 ng/mL (ref <0.03)

## 2018-02-26 MED ORDER — NITROGLYCERIN 0.4 MG SL SUBL: 0.4000 mg | SUBLINGUAL_TABLET | SUBLINGUAL | Status: DC | PRN

## 2018-02-26 MED ORDER — ACETAMINOPHEN 325 MG PO TABS: 650.0000 mg | ORAL_TABLET | ORAL | Status: DC | PRN

## 2018-02-26 MED ORDER — ONDANSETRON HCL 4 MG/2ML IJ SOLN
INTRAMUSCULAR | Status: AC
  Filled 2018-02-26: qty 2

## 2018-02-26 MED ORDER — HEPARIN (PORCINE) IN NACL 100-0.45 UNIT/ML-% IJ SOLN
1200.0000 [IU]/h | INTRAMUSCULAR | Status: DC
  Administered 2018-02-26 – 2018-02-27 (×2): 1200 [IU]/h via INTRAVENOUS
  Filled 2018-02-26 (×3): qty 250

## 2018-02-26 MED ORDER — SODIUM CHLORIDE 0.9 % IV SOLN
INTRAVENOUS | Status: DC
  Administered 2018-02-26: 22:00:00 via INTRAVENOUS

## 2018-02-26 MED ORDER — ASPIRIN 300 MG RE SUPP: 300.0000 mg | RECTAL | Status: AC

## 2018-02-26 MED ORDER — TRAMADOL HCL 50 MG PO TABS: 50.0000 mg | ORAL_TABLET | Freq: Two times a day (BID) | ORAL | Status: DC | PRN

## 2018-02-26 MED ORDER — METOPROLOL TARTRATE 12.5 MG HALF TABLET
12.5000 mg | ORAL_TABLET | Freq: Two times a day (BID) | ORAL | Status: DC
  Administered 2018-02-26: 12.5 mg via ORAL
  Filled 2018-02-26: qty 1

## 2018-02-26 MED ORDER — ONDANSETRON HCL 4 MG/2ML IJ SOLN: 4.0000 mg | Freq: Four times a day (QID) | INTRAMUSCULAR | Status: DC | PRN

## 2018-02-26 MED ORDER — ISOSORBIDE MONONITRATE ER 30 MG PO TB24
30.0000 mg | ORAL_TABLET | Freq: Every day | ORAL | Status: DC
  Administered 2018-02-26 – 2018-02-27 (×2): 30 mg via ORAL
  Filled 2018-02-26: qty 1

## 2018-02-26 MED ORDER — INSULIN ASPART 100 UNIT/ML ~~LOC~~ SOLN: 0.0000 [IU] | Freq: Every day | SUBCUTANEOUS | Status: DC

## 2018-02-26 MED ORDER — ONDANSETRON HCL 4 MG/2ML IJ SOLN
4.0000 mg | Freq: Once | INTRAMUSCULAR | Status: AC
  Administered 2018-02-26: 4 mg via INTRAVENOUS

## 2018-02-26 MED ORDER — ASPIRIN 81 MG PO CHEW: 324.0000 mg | CHEWABLE_TABLET | ORAL | Status: AC

## 2018-02-26 MED ORDER — DOCUSATE SODIUM 100 MG PO CAPS
100.0000 mg | ORAL_CAPSULE | Freq: Every day | ORAL | Status: DC
  Administered 2018-02-26 – 2018-02-28 (×2): 100 mg via ORAL
  Filled 2018-02-26 (×2): qty 1

## 2018-02-26 MED ORDER — HEPARIN BOLUS VIA INFUSION
4000.0000 [IU] | Freq: Once | INTRAVENOUS | Status: AC
  Administered 2018-02-26: 4000 [IU] via INTRAVENOUS
  Filled 2018-02-26: qty 4000

## 2018-02-26 MED ORDER — ASPIRIN EC 81 MG PO TBEC
81.0000 mg | DELAYED_RELEASE_TABLET | Freq: Every day | ORAL | Status: DC
  Administered 2018-02-28: 81 mg via ORAL
  Filled 2018-02-26: qty 1

## 2018-02-26 MED ORDER — SODIUM CHLORIDE 0.9 % IV SOLN: 250.0000 mL | INTRAVENOUS | Status: DC | PRN

## 2018-02-26 MED ORDER — SODIUM CHLORIDE 0.9% FLUSH: 3.0000 mL | INTRAVENOUS | Status: DC | PRN

## 2018-02-26 MED ORDER — INSULIN ASPART 100 UNIT/ML ~~LOC~~ SOLN
0.0000 [IU] | Freq: Three times a day (TID) | SUBCUTANEOUS | Status: DC
  Administered 2018-02-27 – 2018-02-28 (×3): 2 [IU] via SUBCUTANEOUS
  Administered 2018-02-28: 1 [IU] via SUBCUTANEOUS
  Filled 2018-02-26: qty 1

## 2018-02-26 MED ORDER — SODIUM CHLORIDE 0.9% FLUSH
3.0000 mL | Freq: Two times a day (BID) | INTRAVENOUS | Status: DC
  Administered 2018-02-27: 3 mL via INTRAVENOUS

## 2018-02-26 MED ORDER — ASPIRIN 81 MG PO CHEW
81.0000 mg | CHEWABLE_TABLET | ORAL | Status: AC
  Administered 2018-02-27: 81 mg via ORAL
  Filled 2018-02-26: qty 1

## 2018-02-26 MED ORDER — ISOSORBIDE MONONITRATE ER 30 MG PO TB24
30.0000 mg | ORAL_TABLET | Freq: Every day | ORAL | Status: DC
  Administered 2018-02-26: 30 mg via ORAL
  Filled 2018-02-26 (×2): qty 1

## 2018-02-26 MED ORDER — ROSUVASTATIN CALCIUM 10 MG PO TABS
10.0000 mg | ORAL_TABLET | Freq: Every day | ORAL | Status: DC
  Administered 2018-02-26 – 2018-02-28 (×3): 10 mg via ORAL
  Filled 2018-02-26 (×4): qty 1

## 2018-02-26 NOTE — Progress Notes (Signed)
ANTICOAGULATION CONSULT NOTE - Initial Consult  Pharmacy Consult for heparin  Indication: chest pain/ACS  Allergies  Allergen Reactions  . Amoxicillin Hives and Other (See Comments)    "sores in my mouth"  . Oxycodone Hcl     agitation  . Simvastatin     itching    Patient Measurements: Height: 5' 8.5" (174 cm) Weight: 224 lb (101.6 kg) IBW/kg (Calculated) : 69.55 HEPARIN DW (KG): 91.3  Vital Signs: Temp: 98.2 F (36.8 C) (08/21 1815) BP: 174/70 (08/21 1815) Pulse Rate: 69 (08/21 1815)  Labs: Recent Labs    02/26/18 1507  HGB 12.6*  HCT 39.3  PLT 116*  CREATININE 1.57*    Estimated Creatinine Clearance: 40.1 mL/min (A) (by C-G formula based on SCr of 1.57 mg/dL (H)).   Medical History: Past Medical History:  Diagnosis Date  . Arthritis    "knees"  . Bronchitis 11/2011   "first time for me"  . CAD (coronary artery disease)    With DES circumflex 02/2009  . Coronary atherosclerosis of native coronary artery   . Diabetes mellitus with kidney disease ([place])   . Diastolic heart failure ([place])   . Diverticulosis of colon (without mention of hemorrhage)   . Exertional dyspnea    Chronic  . GERD (gastroesophageal reflux disease)   . History of peptic ulcer disease    Remote history  . Hypercholesterolemia   . Hypertension   . Kidney stone    "dr told me my kidney was loaded w/stones; imbedded"  . Labyrinthitis   . Leg edema   . Meralgia paresthetica   . Mixed hyperlipidemia   . Osteoarthrosis, unspecified whether generalized or localized, lower leg   . Palpitations   . Prostate cancer [facility])    s/p prostatectomy  . Type II diabetes mellitus ([place])    dx 3 yrs ago    Assessment: Ryan Hunt is an 84 y.o. Male presenting with chest pain lasting 2 hours, nausea, SOB.  Hgb stable, plt 116. No anticoagulation prior to admission, no s/sx of bleeding noted. Pharmacy consulted to manage heparin.  Goal of Therapy:  Heparin level 0.3-0.7 units/ml Monitor  platelets by anticoagulation protocol: Yes   Plan:  Heparin IV bolus 4000 units x1 Heparin IV infusion 1200 units/hr Will check heparin level in 8 hours Monitor daily heparin levels, CBC, s/sx of bleeding.  Thank you for involving pharmacy in this patient's care.  Janae Bridgeman, PharmD PGY1 Pharmacy Resident Phone: [phone] 02/26/2018 7:22 PM

## 2018-02-26 NOTE — ED Triage Notes (Signed)
Pt here via [facility] for code stemi that was cancelled en-route by cardiology.  Pt stated chest pain this am at 0900  that was relieved after 324 asa.  Pt also took 1 nitro.  Pt is pain free.  Vs hr 74, o2 98% 2L, rr 18, bp 186/91.

## 2018-02-26 NOTE — ED Notes (Signed)
Cardiology at bedside.

## 2018-02-26 NOTE — ED Provider Notes (Addendum)
[facility] Provider Note   CSN: [id number] Arrival date & time: 02/26/18  1457     History   Chief Complaint Chief Complaint  Patient presents with  . Chest Pain    HPI Ryan Hunt is a 84 y.o. male.  84 yo M with a chief complaint of chest pain.  The patient felt like it was a knot or a pressure in the center of his chest.  It was a vague area.  He had significant shortness of breath with it.  Had some nausea but denied vomiting denied diaphoresis.  Occurred while at rest.  He denies any cough congestion or fever.  Has a history of a heart attack he thought maybe it felt different.  Was recently seen by his cardiologist and had a increase of his dose of Lasix as well as started on Imdur.  The history is provided by the patient.  Chest Pain   This is a new problem. The current episode started 3 to 5 hours ago. The problem occurs constantly. The problem has not changed since onset.The pain is present in the substernal region. The pain is at a severity of 8/10. The pain is severe. The quality of the pain is described as brief. The pain does not radiate. Duration of episode(s) is 2 hours. Associated symptoms include shortness of breath. Pertinent negatives include no abdominal pain, no fever, no headaches, no palpitations and no vomiting. He has tried nothing for the symptoms. The treatment provided no relief.  His past medical history is significant for MI.  Pertinent negatives for past medical history include no DVT and no PE.    Past Medical History:  Diagnosis Date  . Arthritis    "knees"  . Bronchitis 11/2011   "first time for me"  . CAD (coronary artery disease)    With DES circumflex 02/2009  . Coronary atherosclerosis of native coronary artery   . 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 Diastolic heart failure ([place])   . Diverticulosis of colon (without mention of hemorrhage)   . Exertional dyspnea    Chronic  . GERD  (gastroesophageal reflux disease)   . History of peptic ulcer disease    Remote history  . Hypercholesterolemia   . Hypertension   . Kidney stone    "dr told me my kidney was loaded w/stones; imbedded"  . Labyrinthitis   . Leg edema   . Meralgia paresthetica   . Mixed hyperlipidemia   . Osteoarthrosis, unspecified whether generalized or localized, lower leg   . Palpitations   . Prostate cancer [facility])    s/p prostatectomy  . Type II diabetes mellitus ([place])    dx 3 yrs ago    Patient Active Problem List   Diagnosis Date Noted  . Unstable angina ([place]) 02/26/2018  . LBBB (left bundle branch block) 11/24/2016  . Bradycardia 11/24/2016  . Synovial cyst of lumbar facet joint 12/23/2014  . CAD (coronary artery disease), native coronary artery 08/20/2013  . Postoperative anemia due to acute blood loss 01/02/2012  . Diabetes mellitus type 2 in obese ([place]) 12/17/2011  . Hypertension   . Bronchitis   . Shortness of breath   . Arthritis   . Hypercholesterolemia   . Left knee DJD     Past Surgical History:  Procedure Laterality Date  . CARDIAC CATHETERIZATION    . CATARACT EXTRACTION W/ INTRAOCULAR LENS IMPLANT & ANTERIOR VITRECTOMY, BILATERAL  1990's  . COLONOSCOPY    .  CORONARY ANGIOPLASTY WITH STENT PLACEMENT  02/2009   Stent x 1 (DES circumflex)  . EYE SURGERY     bil lens implants  . JOINT REPLACEMENT     s/p left knee  . LASIK Bilateral   . LUMBAR LAMINECTOMY/DECOMPRESSION MICRODISCECTOMY N/A 12/23/2014   Procedure:  L2-3 DECOMPRESSION;  Surgeon: Melina Schools, MD;  Location: [facility];  Service: Orthopedics;  Laterality: N/A;  . PROSTATECTOMY  2009   Robotic prostatectomy   . Skin Lumps Removed    . TOTAL KNEE ARTHROPLASTY  12/31/2011   Procedure: TOTAL KNEE ARTHROPLASTY;  Surgeon: Lorn Junes, MD;  Location: [facility];  Service: Orthopedics;  Laterality: Left;  Left total knee arthroplasty  . TOTAL SHOULDER REPLACEMENT  1990's   bilaterally        Home Medications     Prior to Admission medications   Medication Sig Start Date End Date Taking? Authorizing Provider  aspirin EC 81 MG tablet Take 1 tablet (81 mg total) by mouth daily. 11/26/16  Yes Belva Crome, MD  docusate calcium (SURFAK) 240 MG capsule Take 240 mg by mouth daily.    Yes [provider]  furosemide (LASIX) 40 MG tablet Take 1 tablet (40 mg total) by mouth daily. 02/19/18 05/20/18 Yes Burtis Junes, NP  glimepiride (AMARYL) 2 MG tablet Take 2 mg by mouth 2 (two) times daily.   Yes [provider]  isosorbide mononitrate (IMDUR) 30 MG 24 hr tablet Take 1 tablet (30 mg total) by mouth daily. 02/19/18 05/20/18 Yes Burtis Junes, NP  lisinopril (PRINIVIL,ZESTRIL) 20 MG tablet TAKE ONE TABLET BY MOUTH ONCE DAILY 09/12/15  Yes Deboraha Sprang, MD  metFORMIN (GLUCOPHAGE) 1000 MG tablet Take 1,000 mg by mouth 2 (two) times daily with a meal. 11/14/16  Yes [provider]  nitroGLYCERIN (NITROSTAT) 0.4 MG SL tablet Place 0.4 mg under the tongue every 5 (five) minutes as needed. For chest pain   Yes [provider]  rosuvastatin (CRESTOR) 10 MG tablet Take 10 mg by mouth daily.   Yes [provider]  traMADol (ULTRAM) 50 MG tablet Take 50 mg by mouth every 12 (twelve) hours as needed for moderate pain.   Yes [provider]    Family History Family History  Problem Relation Age of Onset  . CVA Father   . CAD Father   . Prostate cancer Father   . Heart disease Father        ASHD  . Stroke Father   . Nephritis Brother   . COPD Brother   . CVA Mother   . Tremor Mother   . Dementia Mother   . Heart attack Neg Hx     Social History Social History   Tobacco Use  . Smoking status: Former Smoker    Packs/day: 0.50    Years: 15.00    Pack years: 7.50    Types: Cigarettes    Last attempt to quit: 07/09/1973    Years since quitting: 44.6  . Smokeless tobacco: Never Used  Substance Use Topics  . Alcohol use: Yes    Comment: 01/01/12  "occassionall have a glass of beer or wine; not even once a week"  . Drug use: No     Allergies   Amoxicillin; Oxycodone hcl; and Simvastatin   Review of Systems Review of Systems  Constitutional: Negative for chills and fever.  HENT: Negative for congestion and facial swelling.   Eyes: Negative for discharge and visual disturbance.  Respiratory: Positive for shortness of breath.   Cardiovascular: Positive for chest pain. Negative for palpitations.  Gastrointestinal: Negative for abdominal pain, diarrhea and vomiting.  Musculoskeletal: Negative for arthralgias and myalgias.  Skin: Negative for color change and rash.  Neurological: Negative for tremors, syncope and headaches.  Psychiatric/Behavioral: Negative for confusion and dysphoric mood.     Physical Exam Updated Vital Signs BP (!) 174/70   Pulse 69   Temp 98.2 F (36.8 C)   Resp 16   Ht 5' 8.5" (1.74 m)   Wt 101.6 kg   SpO2 96%   BMI 33.56 kg/m   Physical Exam  Constitutional: He is oriented to person, place, and time. He appears well-developed and well-nourished.  HENT:  Head: Normocephalic and atraumatic.  Eyes: Pupils are equal, round, and reactive to light. EOM are normal.  Neck: Normal range of motion. Neck supple. No JVD present.  Cardiovascular: Normal rate and regular rhythm. Exam reveals no gallop and no friction rub.  No murmur heard. Pulmonary/Chest: No respiratory distress. He has no wheezes.  Abdominal: He exhibits no distension and no mass. There is no tenderness. There is no rebound and no guarding.  Musculoskeletal: Normal range of motion.  Neurological: He is alert and oriented to person, place, and time.  Skin: No rash noted. No pallor.  Psychiatric: He has a normal mood and affect. His behavior is normal.  Nursing note and vitals reviewed.    ED Treatments / Results  Labs (all labs ordered are listed, but only abnormal results are displayed) Labs Reviewed  BASIC METABOLIC PANEL -  Abnormal; Notable for the following components:      Result Value   Glucose, Bld 187 (*)    BUN 27 (*)    Creatinine, Ser 1.57 (*)    GFR calc non Af Amer 39 (*)    GFR calc Af Amer 45 (*)    All other components within normal limits  CBC - Abnormal; Notable for the following components:   Hemoglobin 12.6 (*)    Platelets 116 (*)    All other components within normal limits  HEPATIC FUNCTION PANEL  MAGNESIUM  TSH  T4, FREE  TROPONIN I  TROPONIN I  TROPONIN I  HEMOGLOBIN K2I  BASIC METABOLIC PANEL  LIPID PANEL  CBC  HEPARIN LEVEL (UNFRACTIONATED)  HEPARIN LEVEL (UNFRACTIONATED)  I-STAT TROPONIN, ED    EKG EKG Interpretation  Date/Time:  Wednesday February 26 2018 15:04:11 EDT Ventricular Rate:  85 PR Interval:    QRS Duration: 145 QT Interval:  459 QTC Calculation: 423 R Axis:   35 Text Interpretation:  Sinus rhythm Left bundle branch block with QRS widening Otherwise no significant change Confirmed by Deno Etienne [phone]) on 02/26/2018 6:12:41 PM   Radiology Dg Chest 2 View  Result Date: 02/26/2018 CLINICAL DATA:  Chest pain EXAM: CHEST - 2 VIEW COMPARISON:  12/22/2014 FINDINGS: Cardiac shadow is stable. Aortic calcifications are again seen. The lungs are well aerated bilaterally. No focal infiltrate or sizable effusion is seen. Degenerative changes of the thoracic spine are noted. IMPRESSION: No acute abnormality noted. Electronically Signed   By: Inez Catalina M.D.   On: 02/26/2018 16:19    Procedures Procedures (including critical care time)  Medications Ordered in ED Medications  ondansetron (ZOFRAN) 4 MG/2ML injection (has no administration in time range)  aspirin chewable tablet 324 mg (324 mg Oral Not Given 02/26/18 1915)    Or  aspirin suppository 300 mg ( Rectal See Alternative 02/26/18 1915)  aspirin EC tablet 81 mg (has no administration in time range)  nitroGLYCERIN (NITROSTAT) SL tablet 0.4 mg (has no administration in time range)  acetaminophen  (TYLENOL) tablet 650 mg (has no administration in time range)  ondansetron (ZOFRAN) injection 4 mg (has no administration in time range)  insulin aspart (novoLOG) injection 0-9 Units (has no administration in time range)  insulin aspart (novoLOG) injection 0-5 Units (has no administration in time range)  isosorbide mononitrate (IMDUR) 24 hr tablet 30 mg (has no administration in time range)  metoprolol tartrate (LOPRESSOR) tablet 12.5 mg (has no administration in time range)  heparin bolus via infusion 4,000 Units (has no administration in time range)  heparin ADULT infusion 100 units/mL (25000 units/298mL sodium chloride 0.45%) (has no administration in time range)  ondansetron (ZOFRAN) injection 4 mg (4 mg Intravenous Given 02/26/18 1735)     Initial Impression / Assessment and Plan / ED Course  I have reviewed the triage vital signs and the nursing notes.  Pertinent labs & imaging results that were available during my care of the patient were reviewed by me and considered in my medical decision making (see chart for details).     84 yo M with a cc of chest pain.  This sounds typical with in nature, patient has a history of coronary artery disease.  Initial troponin is negative EKG unchanged from prior chest x-ray without focal infiltrate.  Pain is currently 0.  Discussed with cardiology who will come and evaluate the patient.  Cards to admit.   CRITICAL CARE Performed by: Cecilio Asper   Total critical care time: 35 minutes  Critical care time was exclusive of separately billable procedures and treating other patients.  Critical care was necessary to treat or prevent imminent or life-threatening deterioration.  Critical care was time spent personally by me on the following activities: development of treatment plan with patient and/or surrogate as well as nursing, discussions with consultants, evaluation of patient's response to treatment, examination of patient, obtaining  history from patient or surrogate, ordering and performing treatments and interventions, ordering and review of laboratory studies, ordering and review of radiographic studies, pulse oximetry and re-evaluation of patient's condition.   The patients results and plan were reviewed and discussed.   Any x-rays performed were independently reviewed by myself.   Differential diagnosis were considered with the presenting HPI.  Medications  ondansetron (ZOFRAN) 4 MG/2ML injection (has no administration in time range)  aspirin chewable tablet 324 mg (324 mg Oral Not Given 02/26/18 1915)    Or  aspirin suppository 300 mg ( Rectal See Alternative 02/26/18 1915)  aspirin EC tablet 81 mg (has no administration in time range)  nitroGLYCERIN (NITROSTAT) SL tablet 0.4 mg (has no administration in time range)  acetaminophen (TYLENOL) tablet 650 mg (has no administration in time range)  ondansetron (ZOFRAN) injection 4 mg (has no administration in time range)  insulin aspart (novoLOG) injection 0-9 Units (has no administration in time range)  insulin aspart (novoLOG) injection 0-5 Units (has no administration in time range)  isosorbide mononitrate (IMDUR) 24 hr tablet 30 mg (has no administration in time range)  metoprolol tartrate (LOPRESSOR) tablet 12.5 mg (has no administration in time range)  heparin bolus via infusion 4,000 Units (has no administration in time range)  heparin ADULT infusion 100 units/mL (25000 units/239mL sodium chloride 0.45%) (has no administration in time range)  ondansetron (ZOFRAN) injection 4 mg (4 mg Intravenous Given 02/26/18 1735)    Vitals:   02/26/18  1515 02/26/18 1530 02/26/18 1545 02/26/18 1815  BP: (!) 165/79 (!) 161/80 (!) 154/60 (!) 174/70  Pulse: (!) 56 68 (!) 54 69  Resp: 16 (!) 27 (!) 22 16  Temp:    98.2 F (36.8 C)  SpO2: 99% 99% 99% 96%  Weight:      Height:        Final diagnoses:  Chest pain with high risk for cardiac etiology    Admission/  observation were discussed with the admitting physician, patient and/or family and they are comfortable with the plan.    Final Clinical Impressions(s) / ED Diagnoses   Final diagnoses:  Chest pain with high risk for cardiac etiology    ED Discharge Orders    None       Deno Etienne, DO 02/26/18 [place], [place], DO 03/10/18 1014

## 2018-02-26 NOTE — ED Notes (Signed)
Pt denies CP at this time. Only reports SOB while eating, placed on 2L [place] for comfort, sats upper 90's.

## 2018-02-26 NOTE — ED Notes (Signed)
CBG result 200

## 2018-02-26 NOTE — ED Notes (Signed)
Patient transported to X-ray 
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
## 2018-02-26 NOTE — ED Notes (Signed)
PT states nausea improved.

## 2018-02-27 ENCOUNTER — Encounter (HOSPITAL_COMMUNITY): Payer: Self-pay | Admitting: Emergency Medicine

## 2018-02-27 ENCOUNTER — Encounter (HOSPITAL_COMMUNITY)
Admission: EM | Disposition: A | Discharge status: Home Health | Payer: Self-pay | Source: Home / Self Care | Attending: Cardiology

## 2018-02-27 ENCOUNTER — Other Ambulatory Visit: Payer: Self-pay

## 2018-02-27 DIAGNOSIS — I10 Essential (primary) hypertension: Secondary | ICD-10-CM

## 2018-02-27 DIAGNOSIS — N183 Chronic kidney disease, stage 3 (moderate): Secondary | ICD-10-CM

## 2018-02-27 DIAGNOSIS — I25118 Atherosclerotic heart disease of native coronary artery with other forms of angina pectoris: Secondary | ICD-10-CM

## 2018-02-27 DIAGNOSIS — E119 Type 2 diabetes mellitus without complications: Secondary | ICD-10-CM

## 2018-02-27 HISTORY — PX: LEFT HEART CATH AND CORONARY ANGIOGRAPHY: CATH118249

## 2018-02-27 LAB — CBC
HCT: 35.2 % — ABNORMAL LOW (ref 39.0–52.0)
Hemoglobin: 11.1 g/dL — ABNORMAL LOW (ref 13.0–17.0)
MCH: 28.9 pg (ref 26.0–34.0)
MCHC: 31.5 g/dL (ref 30.0–36.0)
MCV: 91.7 fL (ref 78.0–100.0)
PLATELETS: 110 10*3/uL — AB (ref 150–400)
RBC: 3.84 MIL/uL — ABNORMAL LOW (ref 4.22–5.81)
RDW: 14.2 % (ref 11.5–15.5)
WBC: 6.6 10*3/uL (ref 4.0–10.5)

## 2018-02-27 LAB — BASIC METABOLIC PANEL
Anion gap: 6 (ref 5–15)
BUN: 24 mg/dL — ABNORMAL HIGH (ref 8–23)
CHLORIDE: 108 mmol/L (ref 98–111)
CO2: 24 mmol/L (ref 22–32)
CREATININE: 1.37 mg/dL — AB (ref 0.61–1.24)
Calcium: 9.5 mg/dL (ref 8.9–10.3)
GFR calc Af Amer: 53 mL/min — ABNORMAL LOW (ref >60)
GFR calc non Af Amer: 45 mL/min — ABNORMAL LOW (ref >60)
Glucose, Bld: 170 mg/dL — ABNORMAL HIGH (ref 70–99)
Potassium: 4.9 mmol/L (ref 3.5–5.1)
SODIUM: 138 mmol/L (ref 135–145)

## 2018-02-27 LAB — LIPID PANEL
CHOLESTEROL: 142 mg/dL (ref 0–200)
HDL: 44 mg/dL (ref >40)
LDL CALC: 83 mg/dL (ref 0–99)
TRIGLYCERIDES: 75 mg/dL (ref <150)
Total CHOL/HDL Ratio: 3.2 RATIO
VLDL: 15 mg/dL (ref 0–40)

## 2018-02-27 LAB — GLUCOSE, CAPILLARY
GLUCOSE-CAPILLARY: 155 mg/dL — AB (ref 70–99)
GLUCOSE-CAPILLARY: 160 mg/dL — AB (ref 70–99)
Glucose-Capillary: 99 mg/dL (ref 70–99)

## 2018-02-27 LAB — HEPARIN LEVEL (UNFRACTIONATED)
HEPARIN UNFRACTIONATED: 0.37 [IU]/mL (ref 0.30–0.70)
Heparin Unfractionated: 0.41 IU/mL (ref 0.30–0.70)

## 2018-02-27 LAB — MRSA PCR SCREENING: MRSA BY PCR: NEGATIVE

## 2018-02-27 LAB — TROPONIN I: Troponin I: <0.03 ng/mL (ref <0.03)

## 2018-02-27 LAB — CBG MONITORING, ED: Glucose-Capillary: 169 mg/dL — ABNORMAL HIGH (ref 70–99)

## 2018-02-27 SURGERY — LEFT HEART CATH AND CORONARY ANGIOGRAPHY
Anesthesia: LOCAL

## 2018-02-27 MED ORDER — HEPARIN (PORCINE) IN NACL 1000-0.9 UT/500ML-% IV SOLN
INTRAVENOUS | Status: DC | PRN
  Administered 2018-02-27 (×3): 500 mL

## 2018-02-27 MED ORDER — HEPARIN SODIUM (PORCINE) 5000 UNIT/ML IJ SOLN: 5000.0000 [IU] | Freq: Three times a day (TID) | INTRAMUSCULAR | Status: DC

## 2018-02-27 MED ORDER — HEPARIN (PORCINE) IN NACL 1000-0.9 UT/500ML-% IV SOLN
INTRAVENOUS | Status: AC
  Filled 2018-02-27: qty 1000

## 2018-02-27 MED ORDER — FENTANYL CITRATE (PF) 100 MCG/2ML IJ SOLN
INTRAMUSCULAR | Status: DC | PRN
  Administered 2018-02-27: 50 ug via INTRAVENOUS

## 2018-02-27 MED ORDER — ACETAMINOPHEN 325 MG PO TABS: 650.0000 mg | ORAL_TABLET | ORAL | Status: DC | PRN

## 2018-02-27 MED ORDER — IOHEXOL 350 MG/ML SOLN
INTRAVENOUS | Status: DC | PRN
  Administered 2018-02-27: 70 mL via INTRA_ARTERIAL

## 2018-02-27 MED ORDER — MIDAZOLAM HCL 2 MG/2ML IJ SOLN
INTRAMUSCULAR | Status: AC
  Filled 2018-02-27: qty 2

## 2018-02-27 MED ORDER — HEPARIN SODIUM (PORCINE) 1000 UNIT/ML IJ SOLN
INTRAMUSCULAR | Status: AC
  Filled 2018-02-27: qty 1

## 2018-02-27 MED ORDER — VERAPAMIL HCL 2.5 MG/ML IV SOLN
INTRAVENOUS | Status: AC
  Filled 2018-02-27: qty 2

## 2018-02-27 MED ORDER — SODIUM CHLORIDE 0.9% FLUSH: 3.0000 mL | Freq: Two times a day (BID) | INTRAVENOUS | Status: DC

## 2018-02-27 MED ORDER — ONDANSETRON HCL 4 MG/2ML IJ SOLN: 4.0000 mg | Freq: Four times a day (QID) | INTRAMUSCULAR | Status: DC | PRN

## 2018-02-27 MED ORDER — SODIUM CHLORIDE 0.9% FLUSH: 3.0000 mL | INTRAVENOUS | Status: DC | PRN

## 2018-02-27 MED ORDER — MIDAZOLAM HCL 2 MG/2ML IJ SOLN
INTRAMUSCULAR | Status: DC | PRN
  Administered 2018-02-27: 1 mg via INTRAVENOUS

## 2018-02-27 MED ORDER — SODIUM CHLORIDE 0.9 % IV SOLN: 250.0000 mL | INTRAVENOUS | Status: DC | PRN

## 2018-02-27 MED ORDER — PNEUMOCOCCAL VAC POLYVALENT 25 MCG/0.5ML IJ INJ
0.5000 mL | INJECTION | INTRAMUSCULAR | Status: AC
  Administered 2018-02-28: 12:00:00 0.5 mL via INTRAMUSCULAR
  Filled 2018-02-27: qty 0.5

## 2018-02-27 MED ORDER — HEPARIN SODIUM (PORCINE) 1000 UNIT/ML IJ SOLN
INTRAMUSCULAR | Status: DC | PRN
  Administered 2018-02-27: 5000 [IU] via INTRAVENOUS

## 2018-02-27 MED ORDER — VERAPAMIL HCL 2.5 MG/ML IV SOLN
INTRAVENOUS | Status: DC | PRN
  Administered 2018-02-27: 10 mL via INTRA_ARTERIAL

## 2018-02-27 MED ORDER — ASPIRIN 81 MG PO CHEW: 81.0000 mg | CHEWABLE_TABLET | Freq: Every day | ORAL | Status: DC

## 2018-02-27 MED ORDER — LIDOCAINE HCL (PF) 1 % IJ SOLN
INTRAMUSCULAR | Status: DC | PRN
  Administered 2018-02-27: 2 mL via INTRADERMAL

## 2018-02-27 MED ORDER — SODIUM CHLORIDE 0.9 % IV SOLN: INTRAVENOUS | Status: AC

## 2018-02-27 MED ORDER — LIDOCAINE HCL (PF) 1 % IJ SOLN
INTRAMUSCULAR | Status: AC
  Filled 2018-02-27: qty 30

## 2018-02-27 MED ORDER — FENTANYL CITRATE (PF) 100 MCG/2ML IJ SOLN
INTRAMUSCULAR | Status: AC
  Filled 2018-02-27: qty 2

## 2018-02-27 SURGICAL SUPPLY — 11 items
CATH INFINITI 5 FR JL3.5 (CATHETERS) ×1 IMPLANT
CATH INFINITI JR4 5F (CATHETERS) ×1 IMPLANT
DEVICE RAD COMP TR BAND LRG (VASCULAR PRODUCTS) ×1 IMPLANT
GLIDESHEATH SLEND A-KIT 6F 22G (SHEATH) ×1 IMPLANT
GUIDEWIRE INQWIRE 1.5J.035X260 (WIRE) IMPLANT
INQWIRE 1.5J .035X260CM (WIRE) ×2
KIT HEART LEFT (KITS) ×2 IMPLANT
PACK CARDIAC CATHETERIZATION (CUSTOM PROCEDURE TRAY) ×2 IMPLANT
SHEATH PROBE COVER 6X72 (BAG) ×1 IMPLANT
TRANSDUCER W/STOPCOCK (MISCELLANEOUS) ×2 IMPLANT
TUBING CIL FLEX 10 FLL-RA (TUBING) ×2 IMPLANT

## 2018-02-27 NOTE — Progress Notes (Signed)
[place] for heparin  Indication: chest pain/ACS  Allergies  Allergen Reactions  . Amoxicillin Hives and Other (See Comments)    "sores in my mouth"  . Oxycodone Hcl     agitation  . Simvastatin     itching    Patient Measurements: Height: 5' 8.5" (174 cm) Weight: 224 lb (101.6 kg) IBW/kg (Calculated) : 69.55 HEPARIN DW (KG): 91.3  Vital Signs: Temp: 98.2 F (36.8 C) (08/21 1815) BP: 132/55 (08/22 0200) Pulse Rate: 50 (08/22 0200)  Labs: Recent Labs    02/26/18 1507 02/26/18 2014 02/27/18 0201  HGB 12.6*  --   --   HCT 39.3  --   --   PLT 116*  --   --   HEPARINUNFRC  --   --  0.37  CREATININE 1.57*  --   --   TROPONINI  --  <0.03  --     Estimated Creatinine Clearance: 40.1 mL/min (A) (by C-G formula based on SCr of 1.57 mg/dL (H)).   Medical History: Past Medical History:  Diagnosis Date  . Arthritis    "knees"  . Bronchitis 11/2011   "first time for me"  . CAD (coronary artery disease)    With DES circumflex 02/2009  . Coronary atherosclerosis of native coronary artery   . Diabetes mellitus with kidney disease ([place])   . Diastolic heart failure ([place])   . Diverticulosis of colon (without mention of hemorrhage)   . Exertional dyspnea    Chronic  . GERD (gastroesophageal reflux disease)   . History of peptic ulcer disease    Remote history  . Hypercholesterolemia   . Hypertension   . Kidney stone    "dr told me my kidney was loaded w/stones; imbedded"  . Labyrinthitis   . Leg edema   . Meralgia paresthetica   . Mixed hyperlipidemia   . Osteoarthrosis, unspecified whether generalized or localized, lower leg   . Palpitations   . Prostate cancer [facility])    s/p prostatectomy  . Type II diabetes mellitus ([place])    dx 3 yrs ago    Assessment: Ryan Hunt is an 84 y.o. Male presenting with chest pain lasting 2 hours, nausea, SOB.  Hgb stable, plt 116. No anticoagulation prior to admission, no s/sx of bleeding  noted. Pharmacy consulted to manage heparin. Initial heparin level therapeutic  Goal of Therapy:  Heparin level 0.3-0.7 units/ml Monitor platelets by anticoagulation protocol: Yes   Plan:  Continue heparin IV infusion 1200 units/hr Will check heparin level in 6 hours to confirm Monitor daily heparin levels, CBC, s/sx of bleeding.  Thank you for involving pharmacy in this patient's care.  Excell Seltzer, PharmD 02/27/2018 2:54 AM

## 2018-02-27 NOTE — Progress Notes (Signed)
TR  BAND REMOVAL  LOCATION:    right radial  DEFLATED PER PROTOCOL:    Yes.    TIME BAND OFF / DRESSING APPLIED:    22:00   SITE UPON ARRIVAL:    Level 0  SITE AFTER BAND REMOVAL:    Level 0  CIRCULATION SENSATION AND MOVEMENT:    Within Normal Limits   Yes.    COMMENTS:   Post TR band instructions given. Pt tolerated well.

## 2018-02-27 NOTE — ED Notes (Signed)
Report given to 2C 

## 2018-02-27 NOTE — Progress Notes (Signed)
[place] for heparin  Indication: chest pain/ACS  Allergies  Allergen Reactions  . Amoxicillin Hives and Other (See Comments)    "sores in my mouth"  . Oxycodone Hcl     agitation  . Simvastatin     itching    Patient Measurements: Height: 5' 8.5" (174 cm) Weight: 224 lb (101.6 kg) IBW/kg (Calculated) : 69.55 HEPARIN DW (KG): 91.3  Vital Signs: BP: 139/63 (08/22 0800) Pulse Rate: 53 (08/22 0800)  Labs: Recent Labs    02/26/18 1507 02/26/18 2014 02/27/18 0201 02/27/18 0702 02/27/18 0900  HGB 12.6*  --   --  11.1*  --   HCT 39.3  --   --  35.2*  --   PLT 116*  --   --  110*  --   HEPARINUNFRC  --   --  0.37  --  0.41  CREATININE 1.57*  --   --   --   --   TROPONINI  --  <0.03 <0.03  --   --     Estimated Creatinine Clearance: 40.1 mL/min (A) (by C-G formula based on SCr of 1.57 mg/dL (H)).   Medical History: Past Medical History:  Diagnosis Date  . Arthritis    "knees"  . Bronchitis 11/2011   "first time for me"  . CAD (coronary artery disease)    With DES circumflex 02/2009  . CHF (congestive heart failure) ([place])   . Coronary atherosclerosis of native coronary artery   . Diabetes mellitus with kidney disease ([place])   . Diastolic heart failure ([place])   . Diverticulosis of colon (without mention of hemorrhage)   . Exertional dyspnea    Chronic  . GERD (gastroesophageal reflux disease)   . History of peptic ulcer disease    Remote history  . Hypercholesterolemia   . Hypertension   . Kidney stone    "dr told me my kidney was loaded w/stones; imbedded"  . Labyrinthitis   . Leg edema   . Meralgia paresthetica   . Mixed hyperlipidemia   . Osteoarthrosis, unspecified whether generalized or localized, lower leg   . Palpitations   . Prostate cancer [facility])    s/p prostatectomy  . Type II diabetes mellitus ([place])    dx 3 yrs ago    Assessment: Ryan Hunt is an 84 y.o. Male presenting with chest pain lasting 2 hours,  nausea, SOB.  Hgb stable, plt 116. No anticoagulation prior to admission, no s/sx of bleeding noted. Pharmacy consulted to manage heparin.  Heparin level remains therapeutic this AM.  Goal of Therapy:  Heparin level 0.3-0.7 units/ml Monitor platelets by anticoagulation protocol: Yes   Plan:  Continue heparin IV infusion 1200 units/hr F/u plans for heparin after heart cath today. Monitor daily heparin levels, CBC, s/sx of bleeding.  Thank you for involving pharmacy in this patient's care.  Marguerite Olea, [facility] Clinical Pharmacist Phone [phone]  02/27/2018 9:43 AM

## 2018-02-27 NOTE — H&P (View-Only) (Signed)
Progress Note  Patient Name: Ryan Hunt Date of Encounter: 02/27/2018  Primary Cardiologist: Sinclair Grooms, MD   Subjective   Reports he had mild chest pain this am like a toothache. Now resolved.   Inpatient Medications    Scheduled Meds: . aspirin  324 mg Oral NOW   Or  . aspirin  300 mg Rectal NOW  . aspirin EC  81 mg Oral Daily  . docusate sodium  100 mg Oral Daily  . insulin aspart  0-5 Units Subcutaneous QHS  . insulin aspart  0-9 Units Subcutaneous TID WC  . isosorbide mononitrate  30 mg Oral Daily  . isosorbide mononitrate  30 mg Oral Daily  . metoprolol tartrate  12.5 mg Oral BID  . rosuvastatin  10 mg Oral Daily  . sodium chloride flush  3 mL Intravenous Q12H   Continuous Infusions: . sodium chloride    . sodium chloride 50 mL/hr at 02/26/18 2214  . heparin 1,200 Units/hr (02/27/18 0834)   PRN Meds: sodium chloride, acetaminophen, nitroGLYCERIN, nitroGLYCERIN, ondansetron (ZOFRAN) IV, sodium chloride flush, traMADol   Vital Signs    Vitals:   02/27/18 0600 02/27/18 0700 02/27/18 0715 02/27/18 0800  BP: (!) 128/54   139/63  Pulse: (!) 44 (!) 55 (!) 49 (!) 53  Resp: (!) 23 16 (!) 26 (!) 23  Temp:      SpO2: 98% 96% 99% 99%  Weight:      Height:        Intake/Output Summary (Last 24 hours) at 02/27/2018 0947 Last data filed at 02/27/2018 0220 Gross per 24 hour  Intake -  Output 325 ml  Net -325 ml   Filed Weights   02/26/18 1503  Weight: 101.6 kg    Telemetry    NSR - Personally Reviewed  ECG     none this am- Personally Reviewed  Physical Exam   GEN: Elderly WM No acute distress.   Neck: No JVD Cardiac: RRR, no murmurs, rubs, or gallops.  Respiratory: Clear to auscultation bilaterally. GI: Soft, nontender, non-distended  MS: trace edema; No deformity. Hyperpigmentation LE Neuro:  Nonfocal  Psych: Normal affect   Labs    Chemistry Recent Labs  Lab 02/26/18 1507 02/26/18 2014  NA 140  --   K 4.8  --   CL 105  --     CO2 25  --   GLUCOSE 187*  --   BUN 27*  --   CREATININE 1.57*  --   CALCIUM 10.2  --   PROT  --  7.2  ALBUMIN  --  3.7  AST  --  45*  ALT  --  32  ALKPHOS  --  52  BILITOT  --  1.0  GFRNONAA 39*  --   GFRAA 45*  --   ANIONGAP 10  --      Hematology Recent Labs  Lab 02/26/18 1507 02/27/18 0702  WBC 5.9 6.6  RBC 4.29 3.84*  HGB 12.6* 11.1*  HCT 39.3 35.2*  MCV 91.6 91.7  MCH 29.4 28.9  MCHC 32.1 31.5  RDW 14.0 14.2  PLT 116* 110*    Cardiac Enzymes Recent Labs  Lab 02/26/18 2014 02/27/18 0201  TROPONINI <0.03 <0.03    Recent Labs  Lab 02/26/18 1518  TROPIPOC 0.00     BNPNo results for input(s): BNP, PROBNP in the last 168 hours.   DDimer No results for input(s): DDIMER in the last 168 hours.   Radiology  Dg Chest 2 View  Result Date: 02/26/2018 CLINICAL DATA:  Chest pain EXAM: CHEST - 2 VIEW COMPARISON:  12/22/2014 FINDINGS: Cardiac shadow is stable. Aortic calcifications are again seen. The lungs are well aerated bilaterally. No focal infiltrate or sizable effusion is seen. Degenerative changes of the thoracic spine are noted. IMPRESSION: No acute abnormality noted. Electronically Signed   By: Inez Catalina M.D.   On: 02/26/2018 16:19    Cardiac Studies   EchoStudy Conclusions5/2018  - Left ventricle: The cavity size was normal. Wall thickness was increased in a pattern of moderate LVH. Doppler parameters are consistent with abnormal left ventricular relaxation (grade 1 diastolic dysfunction).   HolterStudy HighlightsMay 2018    NSR  Rate related BBB  SVT salvos  NSR with rate related BBB and SVT salvos   Notes recorded by Belva Crome, MD on 01/07/2017 at 6:05 PM EDT No significant abnormality found on monitor.   MyoviewStudy Highlights5/2018    Nuclear stress EF: 65%.  There was no ST segment deviation noted during stress.  No T wave inversion was noted during stress.  No significant reversible  ischemia. LVEF 65% with normal wall motion. This is a low risk study.     Patient Profile     84 y.o. male with history of CAD, prior stent 9774, diastolic CHF, DM, HTN, HLD presents with progressive DOE and chest pain.   Assessment & Plan    1. Unstable angina/ progressive dyspnea and chest pain. troponins negative. Not responsive to recent increase in medical therapy. Prior noninvasive work up unrevealing. Plan for definitive evaluation with [facility] today. Awaiting BMET this am. Was hydrated overnight. On IV heparin.  2. Chronic diastolic CHF. Does not appear to be volume overloaded. Await results of hemodynamics. Lasix on hold for cath. 3. DM type 2. Metformin on hold for cath. On SSI 4. CKD stage 3. BMET pending.  5. HTN controlled. 6. HLD on statin  For questions or updates, please contact [place] Please consult [email or web address] for contact info under Cardiology/STEMI.      Signed, Monte Bronder Martinique, MD  02/27/2018, 9:47 AM

## 2018-02-27 NOTE — Clinical Social Work Note (Signed)
CSW acknowledges ALF consult. Please consult PT and OT to determine patient's level of functioning. Will not be able to place patient in ALF from hospital due to amount of time that it takes. Patient does not have Medicaid and would have to pay privately for ALF. RN that placed consult has not discussed possibility with patient. May be able to place patient in SNF if PT and OT determine it is appropriate.  Dayton Scrape, [place]

## 2018-02-27 NOTE — Progress Notes (Signed)
Progress Note  Patient Name: Ryan Hunt Date of Encounter: 02/27/2018  Primary Cardiologist: Sinclair Grooms, MD   Subjective   Reports he had mild chest pain this am like a toothache. Now resolved.   Inpatient Medications    Scheduled Meds: . aspirin  324 mg Oral NOW   Or  . aspirin  300 mg Rectal NOW  . aspirin EC  81 mg Oral Daily  . docusate sodium  100 mg Oral Daily  . insulin aspart  0-5 Units Subcutaneous QHS  . insulin aspart  0-9 Units Subcutaneous TID WC  . isosorbide mononitrate  30 mg Oral Daily  . isosorbide mononitrate  30 mg Oral Daily  . metoprolol tartrate  12.5 mg Oral BID  . rosuvastatin  10 mg Oral Daily  . sodium chloride flush  3 mL Intravenous Q12H   Continuous Infusions: . sodium chloride    . sodium chloride 50 mL/hr at 02/26/18 2214  . heparin 1,200 Units/hr (02/27/18 0834)   PRN Meds: sodium chloride, acetaminophen, nitroGLYCERIN, nitroGLYCERIN, ondansetron (ZOFRAN) IV, sodium chloride flush, traMADol   Vital Signs    Vitals:   02/27/18 0600 02/27/18 0700 02/27/18 0715 02/27/18 0800  BP: (!) 128/54   139/63  Pulse: (!) 44 (!) 55 (!) 49 (!) 53  Resp: (!) 23 16 (!) 26 (!) 23  Temp:      SpO2: 98% 96% 99% 99%  Weight:      Height:        Intake/Output Summary (Last 24 hours) at 02/27/2018 0947 Last data filed at 02/27/2018 0220 Gross per 24 hour  Intake -  Output 325 ml  Net -325 ml   Filed Weights   02/26/18 1503  Weight: 101.6 kg    Telemetry    NSR - Personally Reviewed  ECG     none this am- Personally Reviewed  Physical Exam   GEN: Elderly WM No acute distress.   Neck: No JVD Cardiac: RRR, no murmurs, rubs, or gallops.  Respiratory: Clear to auscultation bilaterally. GI: Soft, nontender, non-distended  MS: trace edema; No deformity. Hyperpigmentation LE Neuro:  Nonfocal  Psych: Normal affect   Labs    Chemistry Recent Labs  Lab 02/26/18 1507 02/26/18 2014  NA 140  --   K 4.8  --   CL 105  --     CO2 25  --   GLUCOSE 187*  --   BUN 27*  --   CREATININE 1.57*  --   CALCIUM 10.2  --   PROT  --  7.2  ALBUMIN  --  3.7  AST  --  45*  ALT  --  32  ALKPHOS  --  52  BILITOT  --  1.0  GFRNONAA 39*  --   GFRAA 45*  --   ANIONGAP 10  --      Hematology Recent Labs  Lab 02/26/18 1507 02/27/18 0702  WBC 5.9 6.6  RBC 4.29 3.84*  HGB 12.6* 11.1*  HCT 39.3 35.2*  MCV 91.6 91.7  MCH 29.4 28.9  MCHC 32.1 31.5  RDW 14.0 14.2  PLT 116* 110*    Cardiac Enzymes Recent Labs  Lab 02/26/18 2014 02/27/18 0201  TROPONINI <0.03 <0.03    Recent Labs  Lab 02/26/18 1518  TROPIPOC 0.00     BNPNo results for input(s): BNP, PROBNP in the last 168 hours.   DDimer No results for input(s): DDIMER in the last 168 hours.   Radiology  Dg Chest 2 View  Result Date: 02/26/2018 CLINICAL DATA:  Chest pain EXAM: CHEST - 2 VIEW COMPARISON:  12/22/2014 FINDINGS: Cardiac shadow is stable. Aortic calcifications are again seen. The lungs are well aerated bilaterally. No focal infiltrate or sizable effusion is seen. Degenerative changes of the thoracic spine are noted. IMPRESSION: No acute abnormality noted. Electronically Signed   By: Inez Catalina M.D.   On: 02/26/2018 16:19    Cardiac Studies   EchoStudy Conclusions5/2018  - Left ventricle: The cavity size was normal. Wall thickness was increased in a pattern of moderate LVH. Doppler parameters are consistent with abnormal left ventricular relaxation (grade 1 diastolic dysfunction).   HolterStudy HighlightsMay 2018    NSR  Rate related BBB  SVT salvos  NSR with rate related BBB and SVT salvos   Notes recorded by Belva Crome, MD on 01/07/2017 at 6:05 PM EDT No significant abnormality found on monitor.   MyoviewStudy Highlights5/2018    Nuclear stress EF: 65%.  There was no ST segment deviation noted during stress.  No T wave inversion was noted during stress.  No significant reversible  ischemia. LVEF 65% with normal wall motion. This is a low risk study.     Patient Profile     84 y.o. male with history of CAD, prior stent 5686, diastolic CHF, DM, HTN, HLD presents with progressive DOE and chest pain.   Assessment & Plan    1. Unstable angina/ progressive dyspnea and chest pain. troponins negative. Not responsive to recent increase in medical therapy. Prior noninvasive work up unrevealing. Plan for definitive evaluation with [facility] today. Awaiting BMET this am. Was hydrated overnight. On IV heparin.  2. Chronic diastolic CHF. Does not appear to be volume overloaded. Await results of hemodynamics. Lasix on hold for cath. 3. DM type 2. Metformin on hold for cath. On SSI 4. CKD stage 3. BMET pending.  5. HTN controlled. 6. HLD on statin  For questions or updates, please contact [place] Please consult [email or web address] for contact info under Cardiology/STEMI.      Signed, Peter Martinique, MD  02/27/2018, 9:47 AM

## 2018-02-27 NOTE — Progress Notes (Signed)
Patient transported via bed to cath lab by cath lab staff.

## 2018-02-27 NOTE — Plan of Care (Signed)

## 2018-02-27 NOTE — Interval H&P Note (Signed)
Cath Lab Visit (complete for each Cath Lab visit)  Clinical Evaluation Leading to the Procedure:   ACS: Yes.    Non-ACS:    Anginal Classification: CCS III  Anti-ischemic medical therapy: Minimal Therapy (1 class of medications)  Non-Invasive Test Results: No non-invasive testing performed  Prior CABG: No previous CABG      History and Physical Interval Note:  02/27/2018 5:59 PM  Ryan Hunt  has presented today for surgery, with the diagnosis of unstable angina  The various methods of treatment have been discussed with the patient and family. After consideration of risks, benefits and other options for treatment, the patient has consented to  Procedure(s): LEFT HEART CATH AND CORONARY ANGIOGRAPHY (N/A) as a surgical intervention .  The patient's history has been reviewed, patient examined, no change in status, stable for surgery.  I have reviewed the patient's chart and labs.  Questions were answered to the patient's satisfaction.     Belva Crome III

## 2018-02-28 ENCOUNTER — Encounter (HOSPITAL_COMMUNITY): Payer: Self-pay | Admitting: Interventional Cardiology

## 2018-02-28 DIAGNOSIS — I2510 Atherosclerotic heart disease of native coronary artery without angina pectoris: Secondary | ICD-10-CM

## 2018-02-28 DIAGNOSIS — I447 Left bundle-branch block, unspecified: Secondary | ICD-10-CM

## 2018-02-28 LAB — GLUCOSE, CAPILLARY
GLUCOSE-CAPILLARY: 138 mg/dL — AB (ref 70–99)
Glucose-Capillary: 168 mg/dL — ABNORMAL HIGH (ref 70–99)

## 2018-02-28 LAB — BASIC METABOLIC PANEL
ANION GAP: 5 (ref 5–15)
BUN: 18 mg/dL (ref 8–23)
CALCIUM: 9 mg/dL (ref 8.9–10.3)
CO2: 25 mmol/L (ref 22–32)
CREATININE: 1.26 mg/dL — AB (ref 0.61–1.24)
Chloride: 107 mmol/L (ref 98–111)
GFR calc Af Amer: 58 mL/min — ABNORMAL LOW (ref >60)
GFR, EST NON AFRICAN AMERICAN: 50 mL/min — AB (ref >60)
GLUCOSE: 143 mg/dL — AB (ref 70–99)
Potassium: 4.6 mmol/L (ref 3.5–5.1)
Sodium: 137 mmol/L (ref 135–145)

## 2018-02-28 MED ORDER — HYDRALAZINE HCL 20 MG/ML IJ SOLN: 10.0000 mg | Freq: Once | INTRAMUSCULAR | Status: DC

## 2018-02-28 NOTE — Progress Notes (Signed)
Progress Note  Patient Name: Ryan Hunt Date of Encounter: 02/28/2018  Primary Cardiologist: Sinclair Grooms, MD   Subjective   Feeling well. No chest pain, sob or palpitations.   Inpatient Medications    Scheduled Meds: . aspirin EC  81 mg Oral Daily  . docusate sodium  100 mg Oral Daily  . heparin  5,000 Units Subcutaneous Q8H  . hydrALAZINE  10 mg Intravenous Once  . insulin aspart  0-5 Units Subcutaneous QHS  . insulin aspart  0-9 Units Subcutaneous TID WC  . metoprolol tartrate  12.5 mg Oral BID  . pneumococcal 23 valent vaccine  0.5 mL Intramuscular Tomorrow-1000  . rosuvastatin  10 mg Oral Daily  . sodium chloride flush  3 mL Intravenous Q12H   Continuous Infusions: . sodium chloride     PRN Meds: sodium chloride, acetaminophen, nitroGLYCERIN, ondansetron (ZOFRAN) IV, sodium chloride flush, traMADol   Vital Signs    Vitals:   02/27/18 2300 02/28/18 0509 02/28/18 0532 02/28/18 0655  BP: (!) 108/35 (!) 175/59 (!) 182/57 (!) 164/54  Pulse: (!) 48 (!) 52    Resp: 13 18 (!) 21 18  Temp:  98.1 F (36.7 C)    TempSrc:  Oral    SpO2: 94% 95%    Weight:  98.1 kg    Height:        Intake/Output Summary (Last 24 hours) at 02/28/2018 0720 Last data filed at 02/28/2018 0510 Gross per 24 hour  Intake 300 ml  Output 525 ml  Net -225 ml   Filed Weights   02/26/18 1503 02/27/18 1400 02/28/18 0509  Weight: 101.6 kg 100.3 kg 98.1 kg    Telemetry    Sinus bradycardia in 40s. Intermittently in 30s with PACs.   ECG    N/A  Physical Exam   GEN: No acute distress.   Neck: No JVD Cardiac: RRR, no murmurs, rubs, or gallops. R radial cath site stable without hematoma.  Respiratory: Clear to auscultation bilaterally. GI: Soft, nontender, non-distended  MS: No edema; No deformity. Neuro:  Nonfocal  Psych: Normal affect   Labs    Chemistry Recent Labs  Lab 02/26/18 1507 02/26/18 2014 02/27/18 0702  NA 140  --  138  K 4.8  --  4.9  CL 105  --   108  CO2 25  --  24  GLUCOSE 187*  --  170*  BUN 27*  --  24*  CREATININE 1.57*  --  1.37*  CALCIUM 10.2  --  9.5  PROT  --  7.2  --   ALBUMIN  --  3.7  --   AST  --  45*  --   ALT  --  32  --   ALKPHOS  --  52  --   BILITOT  --  1.0  --   GFRNONAA 39*  --  45*  GFRAA 45*  --  53*  ANIONGAP 10  --  6     Hematology Recent Labs  Lab 02/26/18 1507 02/27/18 0702  WBC 5.9 6.6  RBC 4.29 3.84*  HGB 12.6* 11.1*  HCT 39.3 35.2*  MCV 91.6 91.7  MCH 29.4 28.9  MCHC 32.1 31.5  RDW 14.0 14.2  PLT 116* 110*    Cardiac Enzymes Recent Labs  Lab 02/26/18 2014 02/27/18 0201 02/27/18 0702  TROPONINI <0.03 <0.03 <0.03    Recent Labs  Lab 02/26/18 1518  TROPIPOC 0.00     Radiology    Dg  Chest 2 View  Result Date: 02/26/2018 CLINICAL DATA:  Chest pain EXAM: CHEST - 2 VIEW COMPARISON:  12/22/2014 FINDINGS: Cardiac shadow is stable. Aortic calcifications are again seen. The lungs are well aerated bilaterally. No focal infiltrate or sizable effusion is seen. Degenerative changes of the thoracic spine are noted. IMPRESSION: No acute abnormality noted. Electronically Signed   By: Inez Catalina M.D.   On: 02/26/2018 16:19    Cardiac Studies   LEFT HEART CATH AND CORONARY ANGIOGRAPHY  Conclusion     Dist LAD lesion is 50% stenosed.    Right dominant coronary anatomy with luminal irregularities in the proximal to mid segment.  Irregularities in the PDA and left ventricular branches.  Widely patent left main with distal 20% eccentric narrowing  LAD wraps around the left ventricular apex.  Proximal to mid vessel contains lucent 30 to 50% narrowing.  The first and second diagonal branches contain 75% ostial narrowing.  First septal perforator contains 90% ostial narrowing.  Circumflex is moderate in size and gives origin to a proximal to mid widely patent stent before 3 obtuse marginal branches arise.  The first obtuse marginal contains ostial 80% narrowing just distal to the  stent margin.  The ostium of the circumflex contains 40 to 50% narrowing.  Overall normal LV function.  LVEDP upper normal at 17 mmHg.  EF estimated to be 55%.  RECOMMENDATIONS:   Symptoms of dyspnea are out of proportion to the degree of coronary disease.  The patient has 2 diagonals, a skeletal perforator, a small first obtuse marginal all of which have significant ostial disease but due to relatively small size of best treated with medical therapy.  Doubt these vessels are the source of the patient's complaints.  Continue secondary risk factor modification.  Consider pulmonary consultation/other etiologies for dyspnea.   Diagnostic Diagram          Patient Profile     84 y.o. male with history of CAD, prior stent 6195, diastolic CHF, DM, HTN, HLD presents with progressive DOE and chest pain.   Assessment & Plan    1. Unstable angina/ progressive dyspnea and chest pain - Troponin remained negative.  Not responsive to recent increase in medical therapy. Treated with IV heparin.    Cath result as above. Significant ostial diagonal disease but not amenable for PCI due to small size. recommended medical therapy. Also symptoms of dyspnea are out of proportion to the degree of coronary disease.  ? Pulmonary consult.  - Continue ASA 81mg  qd and Crestor 10mg . Held BB (started this admission) due to bradycardia. Imdur held as well.   2. Bradycardia - He got one dose of metoprolol 12.5mg  on PM of 02/26/18. HR improving.   3.  Chronic diastolic CHF - Lasix on hold. Cath showed overall normal LV function.  LVEDP upper normal at 17 mmHg.  EF estimated to be 55%. - Will check BMET today.   4. HLD - 02/27/2018: Cholesterol 142; HDL 44; LDL Cholesterol 83; Triglycerides 75; VLDL 15  - LDL goal less than 70. Consider increase Crestor to 20mg  qd.   5. DM type 2 - Metformin on hold for cath. On SSI  6. HTN  - BP elevated this morning. Order written for PRN hydralazine. On hold home ACE  and Imdur. Pending BMET. Improving.     For questions or updates, please contact [place] Please consult [email or web address] for contact info under Cardiology/STEMI.      Jarrett Soho, PA  02/28/2018, 7:20  AM
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
## 2018-02-28 NOTE — Care Management Note (Signed)
Case Management Note  Patient Details  Name: Ryan Hunt MRN: [id number] Date of Birth: 01-25-1931  Subjective/Objective:  Patient from home alone, will be on ASA,  He has CHF, will need [facility] for disease Management, will need order with face to face.  He has a walker, w/chair, rollator at home.  NCM offered choice from [facility] list, he chose [facility], referral made to Butch Penny with [facility] for [facility] for disease management for CHF.                   Action/Plan: DC home when ready.   Expected Discharge Date:  02/28/18               Expected Discharge Plan:  [place]  In-House Referral:     Discharge planning Services  CM Consult  Post Acute Care Choice:  Home Health Choice offered to:  Patient  DME Arranged:    DME Agency:     HH Arranged:  RN, Disease Management [place] Agency:   [facility]  Status of Service:  Completed, signed off  If discussed at [place] of Stay Meetings, dates discussed:    Additional Comments:  Zenon Mayo, RN 02/28/2018, 10:21 AM

## 2018-02-28 NOTE — Consult Note (Signed)
[facility] CM Primary Care Navigator  02/28/2018  Ryan Hunt Nov 29, 1930 [id number]   Met withpatientand son Nicki Reaper) at the bedsideto identify possible discharge needs. Patientreports thathe had"chest pain and shortness of breath" that had led to this admission. (unstable angina underwent cardiac cath)  [place] [facility] care provider.   [facility] in [place] to obtainmedications withoutdifficulty.  Patient reportsmanaginghisownmedications at [facility] use of "pill box" system filled once a week.  Hestatesthathe hasbeendrivingprior to admission but son can providetransportationtohisdoctors' appointments if needed after discharge.   Patient reports that he took care of his wife for several years before she died in 10-Oct-2022. Patient has been physically active and independent prior to this admission. His son will be the primary caregiver at home when needed after discharge.  Anticipated discharge plan ishome with home health services per Inpatient CM note, and home health RN was arranged for disease management of congestive HF.  Patientand son voiced understanding to call primary care provider'soffice whenhegets back home for a post discharge follow-up appointment within1- 2 weeksor sooner if needs arise.Patient letter (with PCP's contact number) was provided asareminder.  Explained topatientregardingTHN CM services available for health managementandresourcesat home,buthe states that he has been managinghishealth issues at home by monitoring/ recording blood sugar and blood pressure, trying his best to adhere with diet, taking medications regularly, staying active and following-up with provider when needed. Patient verbalized that he is expecting a home health RN to be contacting him upon discharge for help with disease management.  Patienthad politely declined [facility]-CM services offeredwhich includes EMMIcalls to follow-up with his recovery at home. Patient was encouraged to seekreferral to [facility] care management from primary care providerifdeemed necessary and appropriate foranyservicesin thenear future.   [facility] care management information provided for future needs thathe may have.    For additional questions please contact:  Edwena Felty A. Merit Maybee, BSN, RN-BC [facility] PRIMARY CARE Navigator Cell: [phone]

## 2018-03-03 ENCOUNTER — Telehealth: Payer: Self-pay | Admitting: Interventional Cardiology

## 2018-03-03 DIAGNOSIS — E785 Hyperlipidemia, unspecified: Secondary | ICD-10-CM | POA: Diagnosis not present

## 2018-03-03 DIAGNOSIS — Z7982 Long term (current) use of aspirin: Secondary | ICD-10-CM | POA: Diagnosis not present

## 2018-03-03 DIAGNOSIS — I25110 Atherosclerotic heart disease of native coronary artery with unstable angina pectoris: Secondary | ICD-10-CM | POA: Diagnosis not present

## 2018-03-03 DIAGNOSIS — Z7984 Long term (current) use of oral hypoglycemic drugs: Secondary | ICD-10-CM | POA: Diagnosis not present

## 2018-03-03 DIAGNOSIS — I5032 Chronic diastolic (congestive) heart failure: Secondary | ICD-10-CM | POA: Diagnosis not present

## 2018-03-03 DIAGNOSIS — I447 Left bundle-branch block, unspecified: Secondary | ICD-10-CM | POA: Diagnosis not present

## 2018-03-03 DIAGNOSIS — E119 Type 2 diabetes mellitus without complications: Secondary | ICD-10-CM | POA: Diagnosis not present

## 2018-03-03 DIAGNOSIS — I110 Hypertensive heart disease with heart failure: Secondary | ICD-10-CM | POA: Diagnosis not present

## 2018-03-03 NOTE — Telephone Encounter (Signed)
New Message:    Pt is requesting a call back

## 2018-03-03 NOTE — Telephone Encounter (Signed)
Spoke with patient about TSH, CBC, ProBNP and BMET on 8/14. The patient expressed understanding and had no further questions.

## 2018-03-04 DIAGNOSIS — I2510 Atherosclerotic heart disease of native coronary artery without angina pectoris: Secondary | ICD-10-CM | POA: Diagnosis not present

## 2018-03-04 DIAGNOSIS — Z7984 Long term (current) use of oral hypoglycemic drugs: Secondary | ICD-10-CM | POA: Diagnosis not present

## 2018-03-04 DIAGNOSIS — I10 Essential (primary) hypertension: Secondary | ICD-10-CM | POA: Diagnosis not present

## 2018-03-04 DIAGNOSIS — E7800 Pure hypercholesterolemia, unspecified: Secondary | ICD-10-CM | POA: Diagnosis not present

## 2018-03-04 DIAGNOSIS — R0609 Other forms of dyspnea: Secondary | ICD-10-CM | POA: Diagnosis not present

## 2018-03-04 DIAGNOSIS — E1165 Type 2 diabetes mellitus with hyperglycemia: Secondary | ICD-10-CM | POA: Diagnosis not present

## 2018-03-04 DIAGNOSIS — I5030 Unspecified diastolic (congestive) heart failure: Secondary | ICD-10-CM | POA: Diagnosis not present

## 2018-03-07 DIAGNOSIS — R7989 Other specified abnormal findings of blood chemistry: Secondary | ICD-10-CM | POA: Diagnosis not present

## 2018-03-19 ENCOUNTER — Institutional Professional Consult (permissible substitution): Payer: PPO | Admitting: Internal Medicine

## 2018-03-20 ENCOUNTER — Encounter: Payer: Self-pay | Admitting: Interventional Cardiology

## 2018-03-25 ENCOUNTER — Encounter: Payer: Self-pay | Admitting: Internal Medicine

## 2018-03-25 ENCOUNTER — Ambulatory Visit: Payer: PPO | Admitting: Internal Medicine

## 2018-03-25 VITALS — BP 144/62 | HR 65 | Ht 67.0 in | Wt 220.6 lb

## 2018-03-25 DIAGNOSIS — I10 Essential (primary) hypertension: Secondary | ICD-10-CM | POA: Diagnosis not present

## 2018-03-25 DIAGNOSIS — R0609 Other forms of dyspnea: Secondary | ICD-10-CM

## 2018-03-25 MED ORDER — TELMISARTAN 80 MG PO TABS: 80.0000 mg | ORAL_TABLET | Freq: Every day | ORAL | 2 refills | Status: AC

## 2018-03-25 NOTE — Assessment & Plan Note (Addendum)
Change acei to arb 02/19/2013 due to pseudowheeze> resolved at f/u ov 05/01/13  - repeated trial off acei 03/25/2018   In the best review of chronic cough to date ( NEJM 2016 375 0630-1601) ,  ACEi are now felt to cause cough in up to  20% of pts which is a 4 fold increase from previous reports and does not include the variety of non-specific complaints we see in pulmonary clinic in pts on ACEi but previously attributed to another dx like  Copd/asthma and  include PNDS, throat and chest congestion, "bronchitis", unexplained dyspnea and noct "strangling" sensations, and hoarseness, but also  atypical /refractory GERD symptoms like dysphagia and "bad heartburn"   The only way I know  to prove this is not an "ACEi Case" is a trial off ACEi x a minimum of 6 weeks then regroup if no better off acei but given the chronicity of this symptom and the prominent pseudowheeze on exam this is an acei case until proven otherwise.  Try micardis 80 mg daily     Total time devoted to counseling  > 50 % of initial 60 min office visit:  review case with pt/ discussion of options/alternatives/ personally creating written customized instructions  in presence of pt  then going over those specific  Instructions directly with the pt including how to use all of the meds but in particular covering each new medication in detail and the difference between the maintenance= "automatic" meds and the prns using an action plan format for the latter (If this problem/symptom => do that organization reading Left to right).  Please see AVS from this visit for a full list of these instructions which I personally wrote for this pt and  are unique to this visit.

## 2018-03-25 NOTE — Progress Notes (Addendum)
Ryan Hunt, male    DOB: April 18, 1931,    MRN: [id number]     Brief patient profile:  83 yowm quit smoking in 1973 eval in pulmonary clinic 2014 with unexplained sob with nl pfts and improved off acei and on gerd rx referred back to pulmonary clinic 03/25/2018 by Dr Delfina Redwood [facility]  02/27/18 LVEDP upper normal at 17 mmHg.  EF estimated to be 55%.    History of Present Illness  03/25/2018  Pulmonary consultation/ Wert back on ACEi and no longer on gerd rx  Chief Complaint  Patient presents with  . 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 Feels even sob at rest sometimes but never noct  Cough: daytime clearing throat, very hoarse  Sleep: flat/ one pillows SABA use:   Lost wife March 2019 "eating out too much now and gaining wt"   No obvious day to day or daytime variability or assoc excess/ purulent sputum or mucus plugs or hemoptysis or cp or chest tightness, subjective wheeze or overt sinus or hb symptoms.   Sleep as above  without nocturnal  or early am exacerbation  of respiratory  c/o's or need for noct saba. Also denies any obvious fluctuation of symptoms with weather or environmental changes or other aggravating or alleviating factors except as outlined above   No unusual exposure hx or h/o childhood pna/ asthma or knowledge of premature birth.  Current Allergies, Complete Past Medical History, Past Surgical History, Family History, and Social History were reviewed in [employer] record.  ROS  The following are not active complaints unless bolded Hoarseness, sore throat, dysphagia, dental problems, itching, sneezing,  nasal congestion or discharge of excess mucus or purulent secretions, ear ache,   fever, chills, sweats, unintended wt loss or wt gain, classically pleuritic or exertional cp,  orthopnea pnd or arm/hand swelling  or leg  swelling, presyncope, palpitations, abdominal pain, anorexia, nausea, vomiting, diarrhea  or change in bowel habits or change in bladder habits, change in stools or change in urine, dysuria, hematuria,  rash, arthralgias, visual complaints, headache, numbness, weakness or ataxia or problems with walking or coordination,  change in mood or  memory.              Past Medical History:  Diagnosis Date  . Anemia   . Anginal pain ([place])   . Arthritis    "knees"  . Bronchitis 11/2011   "first time for me"  . CAD (coronary artery disease)    With DES circumflex 02/2009  . CHF (congestive heart failure) ([place])   . Coronary atherosclerosis of native coronary artery   . 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 Diastolic heart failure ([place])   . Diverticulosis of colon (without mention of hemorrhage)   . Dyspnea   . Exertional dyspnea    Chronic  . GERD (gastroesophageal reflux disease)   . History of kidney stones   . History of peptic ulcer disease    Remote history  . Hypercholesterolemia   . Hypertension   . Kidney stone    "dr told me my kidney was loaded w/stones; imbedded"  . Labyrinthitis   . Leg edema   . Meralgia paresthetica   . Mixed hyperlipidemia   . Osteoarthrosis, unspecified whether generalized or localized, lower leg   .  Palpitations   . Prostate cancer [facility])    s/p prostatectomy  . Type II diabetes mellitus ([place])    dx 3 yrs ago    Outpatient Medications Prior to Visit  Medication Sig Dispense Refill  . aspirin EC 81 MG tablet Take 1 tablet (81 mg total) by mouth daily. 90 tablet 3  . docusate calcium (SURFAK) 240 MG capsule Take 240 mg by mouth daily.     . furosemide (LASIX) 40 MG tablet Take 1 tablet (40 mg total) by mouth daily. 90 tablet 3  . glimepiride (AMARYL) 2 MG tablet Take 2 mg by mouth 2 (two) times daily.    . isosorbide mononitrate (IMDUR) 30 MG 24 hr tablet Take 1 tablet (30 mg total) by mouth daily. 90 tablet 3  . lisinopril  (PRINIVIL,ZESTRIL) 20 MG tablet TAKE ONE TABLET BY MOUTH ONCE DAILY 90 tablet 0  . nitroGLYCERIN (NITROSTAT) 0.4 MG SL tablet Place 0.4 mg under the tongue every 5 (five) minutes as needed. For chest pain    . rosuvastatin (CRESTOR) 10 MG tablet Take 10 mg by mouth daily.    . traMADol (ULTRAM) 50 MG tablet Take 50 mg by mouth every 12 (twelve) hours as needed for moderate pain.    . metFORMIN (GLUCOPHAGE) 1000 MG tablet Take 1,000 mg by mouth 2 (two) times daily with a meal.     No facility-administered medications prior to visit.              Objective:     BP (!) 144/62 (BP Location: Left Arm, Cuff Size: Normal)   Pulse 65   Ht 5\' 7"  (1.702 m)   Wt 220 lb 9.6 oz (100.1 kg)   SpO2 97%   BMI 34.55 kg/m   SpO2: 97 % RA   Wt Readings from Last 3 Encounters:  03/25/18 220 lb 9.6 oz (100.1 kg)  02/28/18 216 lb 4.8 oz (98.1 kg)  02/19/18 225 lb 6.4 oz (102.2 kg)      Obese amb wm very squeaky voice/ pseudowheeze    HEENT: nl   turbinates bilaterally, and oropharynx. Nl external ear canals without cough reflex   NECK :  without JVD/Nodes/TM/ nl carotid upstrokes bilaterally   LUNGS: no acc muscle use,  Nl contour chest which is clear to A and P bilaterally without cough on insp or exp maneuvers   CV:  RRR  no s3 or murmur or increase in P2, and no edema   ABD:  Obese/ soft and nontender with nl inspiratory excursion in the supine position. No bruits or organomegaly appreciated, bowel sounds nl  MS:  Nl gait/ ext warm without deformities, calf tenderness, cyanosis or clubbing No obvious joint restrictions   SKIN: warm and dry with Venous stasis changes both legs    NEURO:  alert, approp, nl sensorium with  no motor or cerebellar deficits apparent.       I personally reviewed images and agree with radiology impression as follows:  CXR:  02/26/18 No acute abnormality noted.      Assessment   Dyspnea on exertion - 04/02/2013  Walked RA x 3 laps @ 185 ft  each stopped due to end of study, no desats - PFT's  04/17/13  wnl  Including fef 25-75 and f/v loop - LHC  02/27/18 LVEDP upper normal at 17 mmHg.  EF estimated to be 55%.   Symptoms are markedly disproportionate to objective findings and not clear to what extent this is actually a pulmonary  problem but pt does appear to have difficult to sort out respiratory symptoms of unknown origin for which  DDX  = almost all start with A and  include Adherence, Ace Inhibitors, Acid Reflux, Active Sinus Disease, Alpha 1 Antitripsin deficiency, Anxiety masquerading as Airways dz,  ABPA,  Allergy(esp in young), Aspiration (esp in elderly), Adverse effects of meds,  Active smokers, A bunch of PE's/clot burden (a few small clots can't cause this syndrome unless there is already severe underlying pulm or vascular dz with poor reserve),  Anemia or thyroid disorder, plus two Bs  = Bronchiectasis and Beta blocker use..and one C= CHF     Adherence is always the initial "prime suspect" and is a multilayered concern that requires a "trust but verify" approach in every patient - starting with knowing how to use medications, especially inhalers, correctly, keeping up with refills and understanding the fundamental difference between maintenance and prns vs those medications only taken for a very short course and then stopped and not refilled.  - rec if not better return here with all meds in hand using a trust but verify approach to confirm accurate Medication  Reconciliation The principal here is that until we are certain that the  patients are doing what we've asked, it makes no sense to ask them to do more.    ACEi adverse effects at the  top of the usual list of suspects and the only way to rule it out is a trial off > see a/p    ? Acid (or non-acid) GERD > always difficult to exclude as up to 75% of pts in some series report no assoc GI/ Heartburn symptoms> rec max (24h)  acid suppression and diet restrictions/ reviewed  and instructions given in writing/ once better can try off again   ? Allergy /asthma > w/u on return if not better off acei trial first    ? Chf> note lvedp upper limits 02/27/18 > ? Component of diastolic dysfunction > fu cards and PCP to keep bp down      Essential hypertension Change acei to arb 02/19/2013 due to pseudowheeze> resolved at f/u ov 05/01/13  - repeated trial off acei 03/25/2018   In the best review of chronic cough to date ( NEJM 2016 375 6712-4580) ,  ACEi are now felt to cause cough in up to  20% of pts which is a 4 fold increase from previous reports and does not include the variety of non-specific complaints we see in pulmonary clinic in pts on ACEi but previously attributed to another dx like  Copd/asthma and  include PNDS, throat and chest congestion, "bronchitis", unexplained dyspnea and noct "strangling" sensations, and hoarseness, but also  atypical /refractory GERD symptoms like dysphagia and "bad heartburn"   The only way I know  to prove this is not an "ACEi Case" is a trial off ACEi x a minimum of 6 weeks then regroup if no better off acei but given the chronicity of this symptom and the prominent pseudowheeze on exam this is an acei case until proven otherwise.  Try micardis 80 mg daily     Total time devoted to counseling  > 50 % of initial 60 min office visit:  review case with pt/ discussion of options/alternatives/ personally creating written customized instructions  in presence of pt  then going over those specific  Instructions directly with the pt including how to use all of the meds but in particular covering each new medication in detail  and the difference between the maintenance= "automatic" meds and the prns using an action plan format for the latter (If this problem/symptom => do that organization reading Left to right).  Please see AVS from this visit for a full list of these instructions which I personally wrote for this pt and  are unique to this  visit.      Christinia Gully, MD 03/25/2018

## 2018-03-25 NOTE — Patient Instructions (Addendum)
Stop lisinopril and start micardis 80 mg daily in its place  Try prilosec otc 20mg   Take 30-60 min before first meal of the day and Pepcid ac (famotidine) 20 mg one @  bedtime until cough / hoarseness completely gone for at least a week       GERD (REFLUX)  is an extremely common cause of respiratory symptoms just like yours , many times with no obvious heartburn at all.    It can be treated with medication, but also with lifestyle changes including elevation of the head of your bed (ideally with 6 inch  bed blocks),  Smoking cessation, avoidance of late meals, excessive alcohol, and avoid fatty foods, chocolate, peppermint, colas, red wine, and acidic juices such as orange juice.  NO MINT OR MENTHOL PRODUCTS SO NO COUGH DROPS   USE SUGARLESS CANDY INSTEAD (Jolley ranchers or Stover's or Life Savers) or even ice chips will also do - the key is to swallow to prevent all throat clearing. NO OIL BASED VITAMINS - use powdered substitutes.      If you are satisfied with your treatment plan,  let your doctor know and he/she can either refill your medications or you can return here when your prescription runs out.     If in any way you are not 100% satisfied,  please tell us.  If 100% better, tell your friends!  Pulmonary follow up is as needed

## 2018-03-25 NOTE — Assessment & Plan Note (Signed)
-   04/02/2013  Walked RA x 3 laps @ 185 ft each stopped due to end of study, no desats - PFT's  04/17/13  wnl  Including fef 25-75 and f/v loop - LHC  02/27/18 LVEDP upper normal at 17 mmHg.  EF estimated to be 55%.   Symptoms are markedly disproportionate to objective findings and not clear to what extent this is actually a pulmonary  problem but pt does appear to have difficult to sort out respiratory symptoms of unknown origin for which  DDX  = almost all start with A and  include Adherence, Ace Inhibitors, Acid Reflux, Active Sinus Disease, Alpha 1 Antitripsin deficiency, Anxiety masquerading as Airways dz,  ABPA,  Allergy(esp in young), Aspiration (esp in elderly), Adverse effects of meds,  Active smokers, A bunch of PE's/clot burden (a few small clots can't cause this syndrome unless there is already severe underlying pulm or vascular dz with poor reserve),  Anemia or thyroid disorder, plus two Bs  = Bronchiectasis and Beta blocker use..and one C= CHF     Adherence is always the initial "prime suspect" and is a multilayered concern that requires a "trust but verify" approach in every patient - starting with knowing how to use medications, especially inhalers, correctly, keeping up with refills and understanding the fundamental difference between maintenance and prns vs those medications only taken for a very short course and then stopped and not refilled.  - rec if not better return here with all meds in hand using a trust but verify approach to confirm accurate Medication  Reconciliation The principal here is that until we are certain that the  patients are doing what we've asked, it makes no sense to ask them to do more.    ACEi adverse effects at the  top of the usual list of suspects and the only way to rule it out is a trial off > see a/p    ? Acid (or non-acid) GERD > always difficult to exclude as up to 75% of pts in some series report no assoc GI/ Heartburn symptoms> rec max (24h)  acid  suppression and diet restrictions/ reviewed and instructions given in writing/ once better can try off again   ? Allergy /asthma > w/u on return if not better off acei trial first    ? Chf> note lvedp upper limits 02/27/18 > ? Component of diastolic dysfunction > fu cards and PCP to keep bp down

## 2018-04-01 NOTE — Progress Notes (Signed)
Cardiology Office Note:    Date:  04/02/2018   ID:  MADS BORGMEYER, DOB [birth year], MRN [id number]  PCP:  Seward Carol, MD  Cardiologist:  Sinclair Grooms, MD   Referring MD: Seward Carol, MD   Chief Complaint  Patient presents with  . Hypertension    History of Present Illness:    Ryan Hunt is a 84 y.o. male with a hx of CAD with circumflex stent 9485, diastolic heart failure, chronic dyspnea on exertion, type 2 diabetes, hyperlipidemia, and dietary indiscretion.  There is also a history of "bronchitis".  In the interval since discharge from the hospital February 28, 2018, he has seen Dr. Melvyn Novas is concerned about upper respiratory instability due to ACE inhibitor therapy.  He was switched to an ARB.  Since the last office visit the patient was admitted to the hospital with chest pain and had a catheter did not demonstrate significant obstructive disease.  Past Medical History:  Diagnosis Date  . Anemia   . Anginal pain ([place])   . Arthritis    "knees"  . Bronchitis 11/2011   "first time for me"  . CAD (coronary artery disease)    With DES circumflex 02/2009  . CHF (congestive heart failure) ([place])   . Coronary atherosclerosis of native coronary artery   . 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 Diastolic heart failure ([place])   . Diverticulosis of colon (without mention of hemorrhage)   . Dyspnea   . Exertional dyspnea    Chronic  . GERD (gastroesophageal reflux disease)   . History of kidney stones   . History of peptic ulcer disease    Remote history  . Hypercholesterolemia   . Hypertension   . Kidney stone    "dr told me my kidney was loaded w/stones; imbedded"  . Labyrinthitis   . Leg edema   . Meralgia paresthetica   . Mixed hyperlipidemia   . Osteoarthrosis, unspecified whether generalized or localized, lower leg   . Palpitations   . Prostate cancer [facility])    s/p prostatectomy  . Type II diabetes mellitus (H[facility])    dx 3 yrs ago    Past  Surgical History:  Procedure Laterality Date  . BACK SURGERY    . CARDIAC CATHETERIZATION    . CATARACT EXTRACTION W/ INTRAOCULAR LENS IMPLANT & ANTERIOR VITRECTOMY, BILATERAL  1990's  . COLONOSCOPY    . CORONARY ANGIOPLASTY WITH STENT PLACEMENT  02/2009   Stent x 1 (DES circumflex)  . DIAGNOSTIC LAPAROSCOPY    . EYE SURGERY     bil lens implants  . JOINT REPLACEMENT     s/p left knee  . LASIK Bilateral   . LEFT HEART CATH AND CORONARY ANGIOGRAPHY N/A 02/27/2018   Procedure: LEFT HEART CATH AND CORONARY ANGIOGRAPHY;  Surgeon: Belva Crome, MD;  Location: [facility];  Service: Cardiovascular;  Laterality: N/A;  . LUMBAR LAMINECTOMY/DECOMPRESSION MICRODISCECTOMY N/A 12/23/2014   Procedure:  L2-3 DECOMPRESSION;  Surgeon: Melina Schools, MD;  Location: [facility];  Service: Orthopedics;  Laterality: N/A;  . PROSTATECTOMY  2009   Robotic prostatectomy   . Skin Lumps Removed    . TOTAL KNEE ARTHROPLASTY  12/31/2011   Procedure: TOTAL KNEE ARTHROPLASTY;  Surgeon: Lorn Junes, MD;  Location: [facility];  Service: Orthopedics;  Laterality: Left;  Left total knee arthroplasty  . TOTAL SHOULDER REPLACEMENT  1990's   bilaterally    Current Medications: Current Meds  Medication Sig  .  aspirin EC 81 MG tablet Take 1 tablet (81 mg total) by mouth daily.  Marland Kitchen CALCIUM PO Take 600 mg by mouth at bedtime.  . Cyanocobalamin (B-12 PO) Take 1,000 mcg by mouth at bedtime.  . docusate calcium (SURFAK) 240 MG capsule Take 240 mg by mouth daily.   . Famotidine (PEPCID PO) Take 1 tablet by mouth at bedtime.  . furosemide (LASIX) 40 MG tablet Take 1 tablet (40 mg total) by mouth daily.  Marland Kitchen glimepiride (AMARYL) 2 MG tablet Take 2 mg by mouth 2 (two) times daily.  . isosorbide mononitrate (IMDUR) 30 MG 24 hr tablet Take 1 tablet (30 mg total) by mouth daily.  . nitroGLYCERIN (NITROSTAT) 0.4 MG SL tablet Place 0.4 mg under the tongue every 5 (five) minutes as needed. For chest pain  . omeprazole (PRILOSEC OTC)  20 MG tablet Take 20 mg by mouth every morning.  . rosuvastatin (CRESTOR) 10 MG tablet Take 10 mg by mouth daily.  Marland Kitchen telmisartan (MICARDIS) 80 MG tablet Take 1 tablet (80 mg total) by mouth daily.  . traMADol (ULTRAM) 50 MG tablet Take 50 mg by mouth every 12 (twelve) hours as needed for moderate pain.     Allergies:   Amoxicillin; Oxycodone hcl; and Simvastatin   Social History   Socioeconomic History  . Marital status: Widowed    Spouse name: Not on file  . Number of children: 1  . Years of education: Not on file  . Highest education level: Not on file  Occupational History  . Occupation: Retired  Scientific laboratory technician  . Financial resource strain: Not hard at all  . Food insecurity:    Worry: Never true    Inability: Never true  . Transportation needs:    Medical: No    Non-medical: No  Tobacco Use  . Smoking status: Former Smoker    Packs/day: 0.50    Years: 15.00    Pack years: 7.50    Types: Cigarettes    Last attempt to quit: 07/10/1971    Years since quitting: 46.7  . Smokeless tobacco: Never Used  Substance and Sexual Activity  . Alcohol use: Yes    Comment: 01/01/12 "occassionall have a glass of beer or wine; not even once a week"  . Drug use: No  . Sexual activity: Never  Lifestyle  . Physical activity:    Days per week: 0 days    Minutes per session: 0 min  . Stress: Not at all  Relationships  . Social connections:    Talks on phone: Not on file    Gets together: Not on file    Attends religious service: Not on file    Active member of club or organization: Not on file    Attends meetings of clubs or organizations: Not on file    Relationship status: Not on file  Other Topics Concern  . Not on file  Social History Narrative  . Not on file     Family History: The patient's family history includes CAD in his father; COPD in his brother; CVA in his father and mother; Dementia in his mother; Heart disease in his father; Nephritis in his brother; Prostate cancer  in his father; Stroke in his father; Tremor in his mother. There is no history of Heart attack.  ROS:   Please see the history of present illness.    High salt intake.  Wife died earlier this year and now he eats out for every single meal.  No  chest pain.  No orthopnea.  Occasional lower extremity swelling.  All other systems reviewed and are negative.  EKGs/Labs/Other Studies Reviewed:    The following studies were reviewed today: Cardiac catheterization February 27, 2018: Diagnostic Diagram        EKG:  EKG is not  ordered today.    Recent Labs: 02/19/2018: NT-Pro BNP 168 02/26/2018: ALT 32; Magnesium 2.0; TSH 0.950 02/27/2018: Hemoglobin 11.1; Platelets 110 02/28/2018: BUN 18; Creatinine, Ser 1.26; Potassium 4.6; Sodium 137  Recent Lipid Panel    Component Value Date/Time   CHOL 142 02/27/2018 0702   TRIG 75 02/27/2018 0702   HDL 44 02/27/2018 0702   CHOLHDL 3.2 02/27/2018 0702   VLDL 15 02/27/2018 0702   LDLCALC 83 02/27/2018 0702    Physical Exam:    VS:  BP (!) 182/80   Pulse (!) 54   Ht 5' 7.5" (1.715 m)   Wt 217 lb 6.4 oz (98.6 kg)   BMI 33.55 kg/m     Wt Readings from Last 3 Encounters:  04/02/18 217 lb 6.4 oz (98.6 kg)  03/25/18 220 lb 9.6 oz (100.1 kg)  02/28/18 216 lb 4.8 oz (98.1 kg)     GEN: Obese, well developed in no acute distress HEENT: Normal NECK: No JVD. LYMPHATICS: No lymphadenopathy CARDIAC: RRR, no murmur, S4 gallop, 1-2+ edema. VASCULAR: 2+ bilateral pulses.  No bruits. RESPIRATORY:  Clear to auscultation without rales, wheezing or rhonchi  ABDOMEN: Soft, non-tender, non-distended, No pulsatile mass, MUSCULOSKELETAL: No deformity  SKIN: Warm and dry NEUROLOGIC:  Alert and oriented x 3 PSYCHIATRIC:  Normal affect   ASSESSMENT:    1. Coronary artery disease involving native coronary artery of native heart without angina pectoris   2. LBBB (left bundle branch block)   3. DOE (dyspnea on exertion)   4. Essential hypertension   5.  Hypercholesterolemia    PLAN:    In order of problems listed above:  1. Recent angiography demonstrated relatively widely patent coronary arteries. 2. Not addressed 3. Being seen by Dr. Melvyn Novas.  ACE inhibitor therapy has been discontinued and he states breathing is slightly improved. 4. Blood pressure is very poorly controlled.  We discussed low-salt diet.  Add amlodipine 5 mg/day.  Purchase a new blood pressure cuff and monitor pressures.  Follow-up in the blood pressure clinic in 3 to 4 weeks.  Target under 140/90 mmHg and ideally 130/80 mmHg. 5. Low-fat diet with goal to keep LDL cholesterol near 70  Monitor blood pressure.  Target blood pressure 130/80.  Want to keep blood pressure at least under 150/90 mmHg.  Encouraged patient to purchase a blood pressure device.  Record blood pressures 2 hours after a.m. medication intake.  Follow-up in blood pressure clinic after adding amlodipine 5 mg/day.  Letter Aldactone if pressure is still elevated.   Medication Adjustments/Labs and Tests Ordered: Current medicines are reviewed at length with the patient today.  Concerns regarding medicines are outlined above.  No orders of the defined types were placed in this encounter.  Meds ordered this encounter  Medications  . amLODipine (NORVASC) 5 MG tablet    Sig: Take 1 tablet (5 mg total) by mouth daily.    Dispense:  180 tablet    Refill:  3    Patient Instructions  Medication Instructions:  Your physician has recommended you make the following change in your medication:   START: amlodipine 5 mg tablet: Take 1 tablet by mouth once a day  Labwork: None ordered  Testing/Procedures: None ordered  Follow-Up: Your physician recommends that you schedule a follow-up appointment in the Hypertension Clinic in 1 month for Blood Pressure Management   Your physician wants you to follow-up in: 1 year with Dr. Tamala Julian. You will receive a reminder letter in the mail two months in advance. If you  don't receive a letter, please call our office to schedule the follow-up appointment.   Any Other Special Instructions Will Be Listed Below (If Applicable).     If you need a refill on your cardiac medications before your next appointment, please call your pharmacy.      Signed, Sinclair Grooms, MD  04/02/2018 4:07 PM    [facility]

## 2018-04-02 ENCOUNTER — Encounter: Payer: Self-pay | Admitting: Interventional Cardiology

## 2018-04-02 ENCOUNTER — Encounter (INDEPENDENT_AMBULATORY_CARE_PROVIDER_SITE_OTHER): Payer: Self-pay

## 2018-04-02 ENCOUNTER — Ambulatory Visit: Payer: PPO | Admitting: Interventional Cardiology

## 2018-04-02 VITALS — BP 182/80 | HR 54 | Ht 67.5 in | Wt 217.4 lb

## 2018-04-02 DIAGNOSIS — I10 Essential (primary) hypertension: Secondary | ICD-10-CM

## 2018-04-02 DIAGNOSIS — E7800 Pure hypercholesterolemia, unspecified: Secondary | ICD-10-CM

## 2018-04-02 DIAGNOSIS — I2510 Atherosclerotic heart disease of native coronary artery without angina pectoris: Secondary | ICD-10-CM

## 2018-04-02 DIAGNOSIS — R0609 Other forms of dyspnea: Secondary | ICD-10-CM

## 2018-04-02 DIAGNOSIS — I447 Left bundle-branch block, unspecified: Secondary | ICD-10-CM | POA: Diagnosis not present

## 2018-04-02 MED ORDER — AMLODIPINE BESYLATE 5 MG PO TABS: 5.0000 mg | ORAL_TABLET | Freq: Every day | ORAL | 3 refills | Status: DC

## 2018-04-02 NOTE — Patient Instructions (Signed)
Medication Instructions:  Your physician has recommended you make the following change in your medication:   START: amlodipine 5 mg tablet: Take 1 tablet by mouth once a day  Labwork: None ordered  Testing/Procedures: None ordered  Follow-Up: Your physician recommends that you schedule a follow-up appointment in the Hypertension Clinic in 1 month for Blood Pressure Management   Your physician wants you to follow-up in: 1 year with Dr. Tamala Julian. You will receive a reminder letter in the mail two months in advance. If you don't receive a letter, please call our office to schedule the follow-up appointment.   Any Other Special Instructions Will Be Listed Below (If Applicable).     If you need a refill on your cardiac medications before your next appointment, please call your pharmacy.

## 2018-04-07 DIAGNOSIS — Z8546 Personal history of malignant neoplasm of prostate: Secondary | ICD-10-CM | POA: Diagnosis not present

## 2018-04-07 DIAGNOSIS — E785 Hyperlipidemia, unspecified: Secondary | ICD-10-CM | POA: Diagnosis not present

## 2018-04-07 DIAGNOSIS — F4321 Adjustment disorder with depressed mood: Secondary | ICD-10-CM | POA: Diagnosis not present

## 2018-04-07 DIAGNOSIS — E1165 Type 2 diabetes mellitus with hyperglycemia: Secondary | ICD-10-CM | POA: Diagnosis not present

## 2018-04-07 DIAGNOSIS — I10 Essential (primary) hypertension: Secondary | ICD-10-CM | POA: Diagnosis not present

## 2018-04-07 DIAGNOSIS — I2510 Atherosclerotic heart disease of native coronary artery without angina pectoris: Secondary | ICD-10-CM | POA: Diagnosis not present

## 2018-04-07 DIAGNOSIS — C61 Malignant neoplasm of prostate: Secondary | ICD-10-CM | POA: Diagnosis not present

## 2018-04-15 ENCOUNTER — Telehealth: Payer: Self-pay | Admitting: Interventional Cardiology

## 2018-04-15 NOTE — Telephone Encounter (Signed)
Pt seen Dr. Tamala Julian 9/25 and was told to f/u with HTN clinic in 1 month.  Appt not scheduled before he left.  Called pt and left a message to call back to schedule.

## 2018-04-16 NOTE — Telephone Encounter (Signed)
Follow up    Patient is returning call in reference to scheduling appt for HTN clinic. Please call to discuss.

## 2018-04-16 NOTE — Telephone Encounter (Signed)
Scheduled the pt a HTN clinic appt for this Friday 04/18/18 at 0930 with Pharmacy.  Pt aware to arrive 15 mins prior to this appt. Pt verbalized understanding and agrees with this plan.

## 2018-04-17 NOTE — Telephone Encounter (Signed)
Left message to call back to move pt's appt.

## 2018-04-17 NOTE — Telephone Encounter (Signed)
Pt called back and rescheduled to 10/28.

## 2018-04-18 ENCOUNTER — Ambulatory Visit: Payer: PPO

## 2018-04-22 DIAGNOSIS — E1169 Type 2 diabetes mellitus with other specified complication: Secondary | ICD-10-CM | POA: Diagnosis not present

## 2018-04-22 DIAGNOSIS — E7800 Pure hypercholesterolemia, unspecified: Secondary | ICD-10-CM | POA: Diagnosis not present

## 2018-04-22 DIAGNOSIS — I10 Essential (primary) hypertension: Secondary | ICD-10-CM | POA: Diagnosis not present

## 2018-04-22 DIAGNOSIS — E663 Overweight: Secondary | ICD-10-CM | POA: Diagnosis not present

## 2018-04-22 DIAGNOSIS — I5030 Unspecified diastolic (congestive) heart failure: Secondary | ICD-10-CM | POA: Diagnosis not present

## 2018-04-22 DIAGNOSIS — Z7984 Long term (current) use of oral hypoglycemic drugs: Secondary | ICD-10-CM | POA: Diagnosis not present

## 2018-04-22 DIAGNOSIS — F4321 Adjustment disorder with depressed mood: Secondary | ICD-10-CM | POA: Diagnosis not present

## 2018-05-01 DIAGNOSIS — E1165 Type 2 diabetes mellitus with hyperglycemia: Secondary | ICD-10-CM | POA: Diagnosis not present

## 2018-05-01 DIAGNOSIS — Z8546 Personal history of malignant neoplasm of prostate: Secondary | ICD-10-CM | POA: Diagnosis not present

## 2018-05-01 DIAGNOSIS — E785 Hyperlipidemia, unspecified: Secondary | ICD-10-CM | POA: Diagnosis not present

## 2018-05-01 DIAGNOSIS — F4321 Adjustment disorder with depressed mood: Secondary | ICD-10-CM | POA: Diagnosis not present

## 2018-05-01 DIAGNOSIS — I2510 Atherosclerotic heart disease of native coronary artery without angina pectoris: Secondary | ICD-10-CM | POA: Diagnosis not present

## 2018-05-01 DIAGNOSIS — C61 Malignant neoplasm of prostate: Secondary | ICD-10-CM | POA: Diagnosis not present

## 2018-05-01 DIAGNOSIS — E1169 Type 2 diabetes mellitus with other specified complication: Secondary | ICD-10-CM | POA: Diagnosis not present

## 2018-05-01 DIAGNOSIS — Z7984 Long term (current) use of oral hypoglycemic drugs: Secondary | ICD-10-CM | POA: Diagnosis not present

## 2018-05-01 DIAGNOSIS — I5030 Unspecified diastolic (congestive) heart failure: Secondary | ICD-10-CM | POA: Diagnosis not present

## 2018-05-01 DIAGNOSIS — I10 Essential (primary) hypertension: Secondary | ICD-10-CM | POA: Diagnosis not present

## 2018-05-04 NOTE — Progress Notes (Signed)
Patient ID: Ryan Hunt                 DOB: 1931-07-06                      MRN: [id number]     HPI: Ryan Hunt is a 84 y.o. male referred by Dr. Tamala Julian to HTN clinic. PMH is significant for CAD with circumflex stent 5188, diastolic heart failure, chronic dyspnea on exertion, type 2 diabetes, hyperlipidemia, and dietary indiscretion. Pt also sees Dr. Melvyn Novas who is ruling out ACEi induced pseudowheeze, DCed ACEi and started telmisartan 03/25/18. He was last seen by Dr. Tamala Julian ~4 weeks ago, BP 182/80. He was started on amlodipine 5 mg daily.  Patient presents today in good spirits. Is extremely short of breath, reports this is unchanged and he is seeing Dr. Melvyn Novas for these symptoms. Reports no missed doses of furosemide. Presents with home blood pressure log, majority of readings in the past month < 140 mmHg (5 greater than or equal to 140 mmHg) since addition of amlodipine. Patient reports watching salt intake. Physical activity is very limited by shortness of breath. Denies dizziness/blurred vision/ headache. Verified accuracy of home blood pressure cuff, clinic reading 140/70 mmHg; home cuff 152/64 mmHg.   Current HTN meds: amlodipine 5 mg daily (AM), isosorbide mononitrate 30 mg daily (AM), telmisartan 80 mg daily (AM) Previously tried: Possible ACEi induced wheeze BP goal: <130/80 mmHg (at least < 140-150/90)  Family History: The patient's family history includes CAD in his father; COPD in his brother; CVA in his father and mother; Dementia in his mother; Heart disease in his father; Nephritis in his brother; Prostate cancer in his father; Stroke in his father; Tremor in his mother. There is no history of Heart attack.  Social History: (-) tobacco, quit 1973; (-) alcohol  Diet: Breakfast: goes out to breakfast with friend every morning, grits, scrambled egg, sausage sandwich, [place] bacon (doesn't add any additional salt); Lunch: sandwich (cheese, tomato), normally eats at home; Dinner:  vegetables (cabbage, red pepper), pinto beans, corn, chicken dumplings, country ham   Exercise: Limited in physical activity due to shortness of breath. Has to ride lawn mower to mailbox to get the mail/paper.  Home BP readings: Patient brings in extensive home blood pressure log dating back to 2011. Reports taking blood pressure when he takes his pills.   Average BP (10/9-10/28): 138/60s; Highest BP 145/65; Lowest: 127/59 Average HR 90s  Home blood pressure cuff: 158/69, repeat 152/64, HR 56  Clinic reading: 145/65, HR 60  Wt Readings from Last 3 Encounters:  04/02/18 217 lb 6.4 oz (98.6 kg)  03/25/18 220 lb 9.6 oz (100.1 kg)  02/28/18 216 lb 4.8 oz (98.1 kg)   BP Readings from Last 3 Encounters:  05/05/18 (!) 145/65  04/02/18 (!) 182/80  03/25/18 (!) 144/62   Pulse Readings from Last 3 Encounters:  05/05/18 60  04/02/18 (!) 54  03/25/18 65    Renal function: CrCl cannot be calculated (Patient's most recent lab result is older than the maximum 21 days allowed.).  Past Medical History:  Diagnosis Date  . Anemia   . Anginal pain ([place])   . Arthritis    "knees"  . Bronchitis 11/2011   "first time for me"  . CAD (coronary artery disease)    With DES circumflex 02/2009  . CHF (congestive heart failure) ([place])   . Coronary atherosclerosis of native coronary artery   . Diabetes mellitus with  kidney disease ([place])   . Diastolic heart failure ([place])   . Diverticulosis of colon (without mention of hemorrhage)   . Dyspnea   . Exertional dyspnea    Chronic  . GERD (gastroesophageal reflux disease)   . History of kidney stones   . History of peptic ulcer disease    Remote history  . Hypercholesterolemia   . Hypertension   . Kidney stone    "dr told me my kidney was loaded w/stones; imbedded"  . Labyrinthitis   . Leg edema   . Meralgia paresthetica   . Mixed hyperlipidemia   . Osteoarthrosis, unspecified whether generalized or localized, lower leg   . Palpitations   .  Prostate cancer [facility])    s/p prostatectomy  . Type II diabetes mellitus ([place])    dx 3 yrs ago    Current Outpatient Medications on File Prior to Visit  Medication Sig Dispense Refill  . aspirin EC 81 MG tablet Take 1 tablet (81 mg total) by mouth daily. 90 tablet 3  . CALCIUM PO Take 600 mg by mouth at bedtime.    . Cyanocobalamin (B-12 PO) Take 1,000 mcg by mouth at bedtime.    . docusate calcium (SURFAK) 240 MG capsule Take 240 mg by mouth daily.     . Famotidine (PEPCID PO) Take 1 tablet by mouth at bedtime.    . furosemide (LASIX) 40 MG tablet Take 1 tablet (40 mg total) by mouth daily. 90 tablet 3  . glimepiride (AMARYL) 2 MG tablet Take 2 mg by mouth 2 (two) times daily.    . isosorbide mononitrate (IMDUR) 30 MG 24 hr tablet Take 1 tablet (30 mg total) by mouth daily. 90 tablet 3  . nitroGLYCERIN (NITROSTAT) 0.4 MG SL tablet Place 0.4 mg under the tongue every 5 (five) minutes as needed. For chest pain    . omeprazole (PRILOSEC OTC) 20 MG tablet Take 20 mg by mouth every morning.    . rosuvastatin (CRESTOR) 10 MG tablet Take 10 mg by mouth daily.    Marland Kitchen telmisartan (MICARDIS) 80 MG tablet Take 1 tablet (80 mg total) by mouth daily. 30 tablet 2  . traMADol (ULTRAM) 50 MG tablet Take 50 mg by mouth every 12 (twelve) hours as needed for moderate pain.     No current facility-administered medications on file prior to visit.     Allergies  Allergen Reactions  . Amoxicillin Hives and Other (See Comments)    "sores in my mouth"  . Oxycodone Hcl     agitation  . Simvastatin     itching     Assessment/Plan:  1. Hypertension - BP in clinic slight above ideal goal of < 130/31mmHg on amlodipine 5 mg daily, imdur 30 mg daily, and telmisartan 80 mg daily. Verified accuracy of home blood pressure cuff, correlates to clinic reading, although slightly higher. Home blood pressure reading largely near goal, possibly closer to goal if BP cuff is reading ~5 mmHg high. Will increase amlodipine to  10 mg daily. Encouraged patient to watch salt intake; likely will not be able to increase activity given shortness of breath. Follow up with HTN clinic in ~4 weeks.  2. Shortness of breath-   Patient has now been off ACEi for ~4 weeks, unlikely persisting shortness of breath is due to ACEi. Encouraged patient to follow up with Dr. Melvyn Novas regarding shortness of breath, could benefit from inhalers.    Claiborne Billings, PharmD PGY2 Cardiology Pharmacy Resident 05/05/2018 5:16 PM

## 2018-05-05 ENCOUNTER — Ambulatory Visit (INDEPENDENT_AMBULATORY_CARE_PROVIDER_SITE_OTHER): Payer: PPO | Admitting: Pharmacist

## 2018-05-05 VITALS — BP 145/65 | HR 60

## 2018-05-05 DIAGNOSIS — I10 Essential (primary) hypertension: Secondary | ICD-10-CM | POA: Diagnosis not present

## 2018-05-05 MED ORDER — AMLODIPINE BESYLATE 10 MG PO TABS: 10.0000 mg | ORAL_TABLET | Freq: Every day | ORAL | 0 refills | Status: DC

## 2018-05-05 NOTE — Patient Instructions (Addendum)
It was nice to meet you today.   Increase your amlodipine to 10 mg daily. Continue taking your isosorbide mononitrate 30 mg daily and telmisartan 80 mg daily.   Continue checking your blood pressure and keeping log. Continue diet low in salt.  If you have any questions or concerns please call [phone]. [place]

## 2018-05-08 ENCOUNTER — Ambulatory Visit (INDEPENDENT_AMBULATORY_CARE_PROVIDER_SITE_OTHER)
Admission: RE | Admit: 2018-05-08 | Discharge: 2018-05-08 | Disposition: A | Discharge status: Home / Self Care | Payer: PPO | Source: Ambulatory Visit | Attending: Internal Medicine | Admitting: Internal Medicine

## 2018-05-08 ENCOUNTER — Ambulatory Visit (INDEPENDENT_AMBULATORY_CARE_PROVIDER_SITE_OTHER): Payer: PPO | Admitting: Internal Medicine

## 2018-05-08 ENCOUNTER — Encounter: Payer: Self-pay | Admitting: Internal Medicine

## 2018-05-08 ENCOUNTER — Other Ambulatory Visit (INDEPENDENT_AMBULATORY_CARE_PROVIDER_SITE_OTHER): Payer: PPO

## 2018-05-08 VITALS — BP 130/64 | HR 60 | Ht 67.0 in | Wt 226.4 lb

## 2018-05-08 DIAGNOSIS — R0609 Other forms of dyspnea: Secondary | ICD-10-CM | POA: Diagnosis not present

## 2018-05-08 DIAGNOSIS — R05 Cough: Secondary | ICD-10-CM | POA: Diagnosis not present

## 2018-05-08 DIAGNOSIS — I10 Essential (primary) hypertension: Secondary | ICD-10-CM

## 2018-05-08 DIAGNOSIS — R0602 Shortness of breath: Secondary | ICD-10-CM | POA: Diagnosis not present

## 2018-05-08 LAB — CBC WITH DIFFERENTIAL/PLATELET
BASOS PCT: 0.4 % (ref 0.0–3.0)
Basophils Absolute: 0 10*3/uL (ref 0.0–0.1)
EOS PCT: 2.1 % (ref 0.0–5.0)
Eosinophils Absolute: 0.1 10*3/uL (ref 0.0–0.7)
HCT: 36.1 % — ABNORMAL LOW (ref 39.0–52.0)
Hemoglobin: 12 g/dL — ABNORMAL LOW (ref 13.0–17.0)
LYMPHS ABS: 1.4 10*3/uL (ref 0.7–4.0)
Lymphocytes Relative: 30.7 % (ref 12.0–46.0)
MCHC: 33.3 g/dL (ref 30.0–36.0)
MCV: 88.4 fl (ref 78.0–100.0)
MONO ABS: 0.4 10*3/uL (ref 0.1–1.0)
MONOS PCT: 9.1 % (ref 3.0–12.0)
NEUTROS ABS: 2.6 10*3/uL (ref 1.4–7.7)
NEUTROS PCT: 57.7 % (ref 43.0–77.0)
Platelets: 93 10*3/uL — ABNORMAL LOW (ref 150.0–400.0)
RBC: 4.08 Mil/uL — ABNORMAL LOW (ref 4.22–5.81)
RDW: 16.3 % — AB (ref 11.5–15.5)
WBC: 4.5 10*3/uL (ref 4.0–10.5)

## 2018-05-08 MED ORDER — PANTOPRAZOLE SODIUM 40 MG PO TBEC: DELAYED_RELEASE_TABLET | ORAL | 2 refills | Status: DC

## 2018-05-08 NOTE — Patient Instructions (Addendum)
Stop prilosec  Pantoprazole (protonix) 40 mg  Take 30- 60 min before your first and last meals of the day    - this is the best way to tell whether stomach acid is contributing to your problem.    Please remember to go to the lab and x-ray department downstairs in the basement  for your tests - we will call you with the results when they are available.  Please schedule a follow up office visit in 2 weeks, sooner if needed  with all medications /inhalers/ solutions in hand so we can verify exactly what you are taking. This includes all medications from all doctors and over the counters

## 2018-05-08 NOTE — Progress Notes (Signed)
Ryan Hunt, male    DOB: Sep 26, 1930,    MRN: [id number]    Brief patient profile:  73 yowm quit smoking in 1973 eval in pulmonary clinic 2014 with unexplained sob with nl pfts and improved off acei and on gerd rx referred back to pulmonary clinic 03/25/2018 by Dr Delfina Redwood with [facility]  02/27/18 LVEDP upper normal at 17 mmHg.  EF estimated to be 55%.    History of Present Illness  03/25/2018  Pulmonary consultation/ Ryan Hunt back on ACEi and no longer on gerd rx  Chief Complaint  Patient presents with  . 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 Feels even sob at rest sometimes but never noct  Cough: daytime clearing throat, very hoarse  Sleep: flat/ one pillows SABA use: none  Lost wife March 2019 "eating out too much now and gaining wt" rec Stop lisinopril and start micardis 80 mg daily in its place Try prilosec otc 20mg   Take 30-60 min before first meal of the day and Pepcid ac (famotidine) 20 mg one @  bedtime until cough / hoarseness completely gone for at least a week  GERD diet     05/08/2018  f/u ov/Ryan Hunt re: worsening sob despite off acei and on otc gerd rx / did not bring meds as req Chief Complaint  Patient presents with  . Follow-up    worse since last visit-SOB with exertion and eating, wheezing, slight cough with gray mucus, feels like mucus gets stuck.   Dyspnea:  X 50 ft reported (but not reproduced)  Cough: feels like something is stuck in throat but doesn't wake him /very min actual mucus production  Sleeping: on side / bricks under hb SABA use: none   No obvious day to day or daytime variability or assoc excess/ purulent sputum or mucus plugs or hemoptysis or cp or chest tightness, subjective wheeze or overt sinus or hb symptoms.   Sleeping ok  without nocturnal  or early am exacerbation  of respiratory  c/o's or need for noct saba. Also denies any  obvious fluctuation of symptoms with weather or environmental changes or other aggravating or alleviating factors except as outlined above   No unusual exposure hx or h/o childhood pna/ asthma or knowledge of premature birth.  Current Allergies, Complete Past Medical History, Past Surgical History, Family History, and Social History were reviewed in [employer] record.  ROS  The following are not active complaints unless bolded Hoarseness, sore throat, dysphagia, dental problems, itching, sneezing,  nasal congestion or discharge of excess mucus or purulent secretions, ear ache,   fever, chills, sweats, unintended wt loss or wt gain, classically pleuritic or exertional cp,  orthopnea pnd or arm/hand swelling  or leg swelling, presyncope, palpitations, abdominal pain, anorexia, nausea, vomiting, diarrhea  or change in bowel habits or change in bladder habits, change in stools or change in urine, dysuria, hematuria,  rash, arthralgias, visual complaints, headache, numbness, weakness or ataxia or problems with walking or coordination,  change in mood or  memory.        Current Meds  Medication Sig  . amLODipine (NORVASC) 10 MG tablet Take 1 tablet (10 mg total) by mouth daily.  Marland Kitchen aspirin EC 81 MG tablet Take 1 tablet (81 mg total) by mouth daily.  Marland Kitchen CALCIUM PO Take 600  mg by mouth at bedtime.  . Cyanocobalamin (B-12 PO) Take 1,000 mcg by mouth at bedtime.  . docusate calcium (SURFAK) 240 MG capsule Take 240 mg by mouth daily.   . furosemide (LASIX) 40 MG tablet Take 1 tablet (40 mg total) by mouth daily.  Marland Kitchen glimepiride (AMARYL) 2 MG tablet Take 2 mg by mouth 2 (two) times daily.  . isosorbide mononitrate (IMDUR) 30 MG 24 hr tablet Take 1 tablet (30 mg total) by mouth daily.  . nitroGLYCERIN (NITROSTAT) 0.4 MG SL tablet Place 0.4 mg under the tongue every 5 (five) minutes as needed. For chest pain  . rosuvastatin (CRESTOR) 10 MG tablet Take 10 mg by mouth daily.  Marland Kitchen telmisartan  (MICARDIS) 80 MG tablet Take 1 tablet (80 mg total) by mouth daily.  . traMADol (ULTRAM) 50 MG tablet Take 50 mg by mouth every 12 (twelve) hours as needed for moderate pain.  .   Famotidine (PEPCID PO) Take 1 tablet by mouth at bedtime.  Marland Kitchen   omeprazole (PRILOSEC OTC) 20 MG tablet Take 20 mg by mouth every morning.                           Objective:    Amb wm nad somewhat squeaky voice    05/08/2018     226   03/25/18 220 lb 9.6 oz (100.1 kg)  02/28/18 216 lb 4.8 oz (98.1 kg)  02/19/18 225 lb 6.4 oz (102.2 kg)    Vital signs reviewed - Note on arrival 02 sats  96% on RA and bp 130/64       HEENT: nl dentition, turbinates bilaterally, and oropharynx. Nl external ear canals without cough reflex   NECK :  without JVD/Nodes/TM/ nl carotid upstrokes bilaterally   LUNGS: no acc muscle use,  Nl contour chest with mostly pseudowheezes without cough on insp or exp maneuvers   CV:  RRR  no s3 or murmur or increase in P2, and 1+ pitting despite elastic hose = both LE's  ABD:  Quite obese soft and nontender with nl inspiratory excursion in the supine position. No bruits or organomegaly appreciated, bowel sounds nl  MS:  somewhat waddling but steady gait/ ext warm without deformities, calf tenderness, cyanosis or clubbing No obvious joint restrictions   SKIN: warm and dry with chronic venous stasis changes both LE's  NEURO:  alert, approp, nl sensorium with  no motor or cerebellar deficits apparent.      CXR PA and Lateral:   05/08/2018 :    I personally reviewed images and agree with radiology impression as follows:   Stable cardiomegaly.  No acute cardiopulmonary disease.   Labs ordered/ reviewed:      Chemistry      Component Value Date/Time   NA 142 05/08/2018 1727   NA 140 02/19/2018 1545   K 4.3 05/08/2018 1727   CL 108 05/08/2018 1727   CO2 20 05/08/2018 1727   BUN 26 (H) 05/08/2018 1727   BUN 18 02/19/2018 1545   CREATININE 1.35 05/08/2018 1727       Component Value Date/Time   CALCIUM 10.0 05/08/2018 1727                              Lab Results  Component Value Date   WBC 4.5 05/08/2018   HGB 12.0 (L) 05/08/2018   HCT 36.1 (L) 05/08/2018   MCV 88.4  05/08/2018   PLT 93.0 (L) 05/08/2018       E0S                                                                0.1                                     05/08/2018   Lab Results  Component Value Date   DDIMER 0.99 (H) 05/08/2018      Lab Results  Component Value Date   TSH 2.00 05/08/2018     Lab Results  Component Value Date   PROBNP 163.0 (H) 05/08/2018              Assessment

## 2018-05-09 ENCOUNTER — Encounter: Payer: Self-pay | Admitting: Internal Medicine

## 2018-05-09 DIAGNOSIS — Z79899 Other long term (current) drug therapy: Secondary | ICD-10-CM | POA: Diagnosis not present

## 2018-05-09 LAB — BASIC METABOLIC PANEL
BUN: 26 mg/dL — AB (ref 6–23)
CALCIUM: 10 mg/dL (ref 8.4–10.5)
CO2: 20 mEq/L (ref 19–32)
CREATININE: 1.35 mg/dL (ref 0.40–1.50)
Chloride: 108 mEq/L (ref 96–112)
GFR: 53.3 mL/min — ABNORMAL LOW (ref >60.00)
Glucose, Bld: 186 mg/dL — ABNORMAL HIGH (ref 70–99)
Potassium: 4.3 mEq/L (ref 3.5–5.1)
Sodium: 142 mEq/L (ref 135–145)

## 2018-05-09 LAB — D-DIMER, QUANTITATIVE: D-Dimer, Quant: 0.99 mcg/mL FEU — ABNORMAL HIGH (ref <0.50)

## 2018-05-09 LAB — BRAIN NATRIURETIC PEPTIDE: PRO B NATRI PEPTIDE: 163 pg/mL — AB (ref 0.0–100.0)

## 2018-05-09 LAB — TSH: TSH: 2 u[IU]/mL (ref 0.35–4.50)

## 2018-05-09 NOTE — Assessment & Plan Note (Signed)
Body mass index is 35.46 kg/m.  -  trending up Lab Results  Component Value Date   TSH 0.950 02/26/2018     Contributing to gerd risk/ doe/reviewed the need and the process to achieve and maintain neg calorie balance > defer f/u primary care including intermittently monitoring thyroid status      I had an extended discussion with the patient reviewing all relevant studies completed to date and  lasting 25 minutes of a 40  minute office  visit addressing  Severe/ progressive  non-specific but potentially very serious refractory respiratory symptoms of uncertain and potentially multiple  etiologies.   Each maintenance medication was reviewed in detail including most importantly the difference between maintenance and prns and under what circumstances the prns are to be triggered using an action plan format that is not reflected in the computer generated alphabetically organized AVS.    Please see AVS for specific instructions unique to this office visit that I personally wrote and verbalized to the the pt in detail and then reviewed with pt  by my nurse highlighting any changes in therapy/plan of care  recommended at today's visit.

## 2018-05-09 NOTE — Assessment & Plan Note (Signed)
Change acei to arb 02/19/2013 due to pseudowheeze> resolved at f/u ov 05/01/13  - repeated trial off acei 03/25/2018   Adequate control on present rx, reviewed in detail with pt > no change in rx needed  / still some pseudowheeze so leave off

## 2018-05-09 NOTE — Progress Notes (Signed)
LMTCB

## 2018-05-09 NOTE — Assessment & Plan Note (Addendum)
-   04/02/2013  Walked RA x 3 laps @ 185 ft each stopped due to end of study, no desats - PFT's  04/17/13  wnl  Including fef 25-75 and f/v loop - LHC  02/27/18 LVEDP upper normal at 17 mmHg.  EF estimated to be 55%.  - 05/08/2018  Walked RA x 3 laps @ 185 ft each stopped due to  End of study, nl pace, no  desat - sob at end - Spirometry 05/08/2018  FEV1 2.2 (95%)  Ratio 80 min curvature with active "wheeze"on exam      Symptoms continue to be markedly disproportionate to objective findings and not clear to what extent this is actually a pulmonary  problem but pt does appear to have difficult to sort out respiratory symptoms of unknown origin for which  DDX  = almost all start with A and  include Adherence, Ace Inhibitors, Acid Reflux, Active Sinus Disease, Alpha 1 Antitripsin deficiency, Anxiety masquerading as Airways dz,  ABPA,  Allergy(esp in young), Aspiration (esp in elderly), Adverse effects of meds,  Active smoking or Vaping, A bunch of PE's/clot burden (a few small clots can't cause this syndrome unless there is already severe underlying pulm or vascular dz with poor reserve),  Anemia or thyroid disorder, plus two Bs  = Bronchiectasis and Beta blocker use..and one C= CHF    Adherence is always the initial "prime suspect" and is a multilayered concern that requires a "trust but verify" approach in every patient - starting with knowing how to use medications, especially inhalers, correctly, keeping up with refills and understanding the fundamental difference between maintenance and prns vs those medications only taken for a very short course and then stopped and not refilled.  - with all meds in hand using a trust but verify approach to confirm accurate Medication  Reconciliation The principal here is that until we are certain that the  patients are doing what we've asked, it makes no sense to ask them to do more.    ? Acid (or non-acid) GERD > always difficult to exclude as up to 75% of pts in some  series report no assoc GI/ Heartburn symptoms> rec max (24h)  acid suppression and diet restrictions/ reviewed and instructions given in writing.  >>> change to protonix 40 mg bid ac x 2 weeks then return   ? Still acei effects  Although even in retrospect it may not be clear the ACEi contributed to the pt's symptoms,  Pt improved off them and adding them back at this point or in the future would risk confusion in interpretation of non-specific respiratory symptoms to which this patient is prone  ie  Better not to muddy the waters here.   ? Anemia /thyroid dz> ruled out    ?  A bunch of PE's >  D dimer  High nl (adjusted for age and chronic illess)  - while a   This  value (seen commonly in the elderly or chronically ill)  may miss small peripheral pe, the clot burden with sob is moderately high and the d dimer  has a very high neg pred value if used in this setting.    ? chf  > excluded by bnp so low

## 2018-05-12 ENCOUNTER — Telehealth: Payer: Self-pay | Admitting: Internal Medicine

## 2018-05-12 NOTE — Telephone Encounter (Signed)
Patient is aware of results and verbalized understanding. Nothing further needed.  

## 2018-06-04 DIAGNOSIS — C61 Malignant neoplasm of prostate: Secondary | ICD-10-CM | POA: Diagnosis not present

## 2018-06-04 DIAGNOSIS — I10 Essential (primary) hypertension: Secondary | ICD-10-CM | POA: Diagnosis not present

## 2018-06-04 DIAGNOSIS — E785 Hyperlipidemia, unspecified: Secondary | ICD-10-CM | POA: Diagnosis not present

## 2018-06-04 DIAGNOSIS — E1169 Type 2 diabetes mellitus with other specified complication: Secondary | ICD-10-CM | POA: Diagnosis not present

## 2018-06-04 DIAGNOSIS — I2510 Atherosclerotic heart disease of native coronary artery without angina pectoris: Secondary | ICD-10-CM | POA: Diagnosis not present

## 2018-06-04 DIAGNOSIS — F4321 Adjustment disorder with depressed mood: Secondary | ICD-10-CM | POA: Diagnosis not present

## 2018-06-04 DIAGNOSIS — E1165 Type 2 diabetes mellitus with hyperglycemia: Secondary | ICD-10-CM | POA: Diagnosis not present

## 2018-06-04 DIAGNOSIS — I5030 Unspecified diastolic (congestive) heart failure: Secondary | ICD-10-CM | POA: Diagnosis not present

## 2018-06-04 DIAGNOSIS — Z8546 Personal history of malignant neoplasm of prostate: Secondary | ICD-10-CM | POA: Diagnosis not present

## 2018-06-16 ENCOUNTER — Other Ambulatory Visit: Payer: Self-pay | Admitting: Internal Medicine

## 2018-06-16 ENCOUNTER — Ambulatory Visit (INDEPENDENT_AMBULATORY_CARE_PROVIDER_SITE_OTHER): Payer: PPO | Admitting: Pharmacist

## 2018-06-16 VITALS — BP 138/62 | HR 58 | Resp 14

## 2018-06-16 DIAGNOSIS — I10 Essential (primary) hypertension: Secondary | ICD-10-CM

## 2018-06-16 NOTE — Patient Instructions (Signed)
Thank you for coming in today.   Continue all your medications as prescribed.   Call the clinic if your medication list does not match our list so that we can update the list.

## 2018-06-16 NOTE — Progress Notes (Signed)
Patient ID: Ryan Hunt                 DOB: Nov 23, 1930                      MRN: [id number]     HPI: Ryan Hunt is a 84 y.o. male referred by Dr. Tamala Julian to HTN clinic. PMH is significant for CAD with circumflex stent 8676, diastolic heart failure, chronic dyspnea on exertion, type 2 diabetes, hyperlipidemia, and dietary indiscretion. Pt also sees Dr. Melvyn Novas who is ruling out ACEi induced pseudowheeze, DCed ACEi and started telmisartan 03/25/18. At most recent office visit amlodipine was increased to 10mg  daily.   Patient presents today in good spirits. He states that he forgot his home log today. He also is unsure about his dose of furosemide that he has been taking. We previously did not have this on his medication list. He states his primary care provider prescribed him this and that he takes it daily.   Current HTN meds: amlodipine 10 mg daily (AM), isosorbide mononitrate 30 mg daily (AM), telmisartan 80 mg daily (AM), Furosemide 20 or 40mg  daily?? Previously tried: Possible ACEi induced wheeze BP goal: <130/80 mmHg (at least < 140-150/90)  Family History: The patient's family history includes CAD in his father; COPD in his brother; CVA in his father and mother; Dementia in his mother; Heart disease in his father; Nephritis in his brother; Prostate cancer in his father; Stroke in his father; Tremor in his mother. There is no history of Heart attack.  Social History: (-) tobacco, quit 1973; (-) alcohol  Diet: Breakfast: goes out to breakfast with friend every morning, grits, scrambled egg, sausage sandwich, [place] bacon (doesn't add any additional salt); Lunch: sandwich (cheese, tomato), normally eats at home; Dinner: vegetables (cabbage, red pepper), pinto beans, corn, chicken dumplings, country ham   Exercise: Limited in physical activity due to shortness of breath. Has to ride lawn mower to mailbox to get the mail/paper.  Home BP readings: forgot log today, but states that pressure are  better  Wt Readings from Last 3 Encounters:  05/08/18 226 lb 6.4 oz (102.7 kg)  04/02/18 217 lb 6.4 oz (98.6 kg)  03/25/18 220 lb 9.6 oz (100.1 kg)   BP Readings from Last 3 Encounters:  05/08/18 130/64  05/05/18 (!) 145/65  04/02/18 (!) 182/80   Pulse Readings from Last 3 Encounters:  05/08/18 60  05/05/18 60  04/02/18 (!) 54    Renal function: CrCl cannot be calculated (Patient's most recent lab result is older than the maximum 21 days allowed.).  Past Medical History:  Diagnosis Date  . Anemia   . Anginal pain ([place])   . Arthritis    "knees"  . Bronchitis 11/2011   "first time for me"  . CAD (coronary artery disease)    With DES circumflex 02/2009  . CHF (congestive heart failure) ([place])   . Coronary atherosclerosis of native coronary artery   . 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 Diastolic heart failure ([place])   . Diverticulosis of colon (without mention of hemorrhage)   . Dyspnea   . Exertional dyspnea    Chronic  . GERD (gastroesophageal reflux disease)   . History of kidney stones   . History of peptic ulcer disease    Remote history  . Hypercholesterolemia   . Hypertension   . Kidney stone    "dr told me my kidney was loaded w/stones; imbedded"  .  Labyrinthitis   . Leg edema   . Meralgia paresthetica   . Mixed hyperlipidemia   . Osteoarthrosis, unspecified whether generalized or localized, lower leg   . Palpitations   . Prostate cancer [facility])    s/p prostatectomy  . Type II diabetes mellitus ([place])    dx 3 yrs ago    Current Outpatient Medications on File Prior to Visit  Medication Sig Dispense Refill  . amLODipine (NORVASC) 10 MG tablet Take 1 tablet (10 mg total) by mouth daily. 90 tablet 0  . aspirin EC 81 MG tablet Take 1 tablet (81 mg total) by mouth daily. 90 tablet 3  . CALCIUM PO Take 600 mg by mouth at bedtime.    . Cyanocobalamin (B-12 PO) Take 1,000 mcg by mouth at bedtime.    . docusate calcium (SURFAK) 240 MG capsule Take 240  mg by mouth daily.     . furosemide (LASIX) 40 MG tablet Take 1 tablet (40 mg total) by mouth daily. 90 tablet 3  . glimepiride (AMARYL) 2 MG tablet Take 2 mg by mouth 2 (two) times daily.    . isosorbide mononitrate (IMDUR) 30 MG 24 hr tablet Take 1 tablet (30 mg total) by mouth daily. 90 tablet 3  . nitroGLYCERIN (NITROSTAT) 0.4 MG SL tablet Place 0.4 mg under the tongue every 5 (five) minutes as needed. For chest pain    . pantoprazole (PROTONIX) 40 MG tablet Take 30- 60 min before your first and last meals of the day 60 tablet 2  . rosuvastatin (CRESTOR) 10 MG tablet Take 10 mg by mouth daily.    Marland Kitchen telmisartan (MICARDIS) 80 MG tablet Take 1 tablet (80 mg total) by mouth daily. 30 tablet 2  . traMADol (ULTRAM) 50 MG tablet Take 50 mg by mouth every 12 (twelve) hours as needed for moderate pain.     No current facility-administered medications on file prior to visit.     Allergies  Allergen Reactions  . Amoxicillin Hives and Other (See Comments)    "sores in my mouth"  . Oxycodone Hcl     agitation  . Simvastatin     itching     Assessment/Plan:  1. Hypertension - BP today is borderline at goal. Will continue medication therapy as prescribed. Advised he compare medication list to printed list and call with updates since unsure of furosemide dose. Continue to monitor pressures and follow up with Dr. Tamala Julian as scheduled and HTN clinic as needed.   Thank you, Lelan Pons. [facility], [place]  [address] [place], [place] [address]  Phone: [phone]; Fax: [phone] 06/16/2018 3:27 PM

## 2018-06-27 DIAGNOSIS — Z8546 Personal history of malignant neoplasm of prostate: Secondary | ICD-10-CM | POA: Diagnosis not present

## 2018-06-27 DIAGNOSIS — I10 Essential (primary) hypertension: Secondary | ICD-10-CM | POA: Diagnosis not present

## 2018-06-27 DIAGNOSIS — N183 Chronic kidney disease, stage 3 (moderate): Secondary | ICD-10-CM | POA: Diagnosis not present

## 2018-06-27 DIAGNOSIS — E785 Hyperlipidemia, unspecified: Secondary | ICD-10-CM | POA: Diagnosis not present

## 2018-06-27 DIAGNOSIS — E1169 Type 2 diabetes mellitus with other specified complication: Secondary | ICD-10-CM | POA: Diagnosis not present

## 2018-06-27 DIAGNOSIS — I2510 Atherosclerotic heart disease of native coronary artery without angina pectoris: Secondary | ICD-10-CM | POA: Diagnosis not present

## 2018-06-27 DIAGNOSIS — C61 Malignant neoplasm of prostate: Secondary | ICD-10-CM | POA: Diagnosis not present

## 2018-06-27 DIAGNOSIS — E1165 Type 2 diabetes mellitus with hyperglycemia: Secondary | ICD-10-CM | POA: Diagnosis not present

## 2018-06-27 DIAGNOSIS — I5030 Unspecified diastolic (congestive) heart failure: Secondary | ICD-10-CM | POA: Diagnosis not present

## 2018-06-27 DIAGNOSIS — F4321 Adjustment disorder with depressed mood: Secondary | ICD-10-CM | POA: Diagnosis not present

## 2018-07-10 DIAGNOSIS — E7800 Pure hypercholesterolemia, unspecified: Secondary | ICD-10-CM | POA: Diagnosis not present

## 2018-07-10 DIAGNOSIS — K219 Gastro-esophageal reflux disease without esophagitis: Secondary | ICD-10-CM | POA: Diagnosis not present

## 2018-07-10 DIAGNOSIS — N183 Chronic kidney disease, stage 3 (moderate): Secondary | ICD-10-CM | POA: Diagnosis not present

## 2018-07-10 DIAGNOSIS — R0609 Other forms of dyspnea: Secondary | ICD-10-CM | POA: Diagnosis not present

## 2018-07-10 DIAGNOSIS — Z7984 Long term (current) use of oral hypoglycemic drugs: Secondary | ICD-10-CM | POA: Diagnosis not present

## 2018-07-10 DIAGNOSIS — E1165 Type 2 diabetes mellitus with hyperglycemia: Secondary | ICD-10-CM | POA: Diagnosis not present

## 2018-07-10 DIAGNOSIS — I10 Essential (primary) hypertension: Secondary | ICD-10-CM | POA: Diagnosis not present

## 2018-07-10 DIAGNOSIS — I5030 Unspecified diastolic (congestive) heart failure: Secondary | ICD-10-CM | POA: Diagnosis not present

## 2018-07-10 DIAGNOSIS — M545 Low back pain: Secondary | ICD-10-CM | POA: Diagnosis not present

## 2018-07-14 ENCOUNTER — Encounter: Payer: Self-pay | Admitting: Internal Medicine

## 2018-07-14 ENCOUNTER — Ambulatory Visit (INDEPENDENT_AMBULATORY_CARE_PROVIDER_SITE_OTHER): Payer: PPO | Admitting: Internal Medicine

## 2018-07-14 VITALS — BP 142/60 | HR 67 | Ht 67.0 in | Wt 223.4 lb

## 2018-07-14 DIAGNOSIS — R0609 Other forms of dyspnea: Secondary | ICD-10-CM | POA: Diagnosis not present

## 2018-07-14 DIAGNOSIS — R058 Other specified cough: Secondary | ICD-10-CM

## 2018-07-14 DIAGNOSIS — R05 Cough: Secondary | ICD-10-CM | POA: Diagnosis not present

## 2018-07-14 LAB — D-DIMER, QUANTITATIVE (NOT AT ARMC): D DIMER QUANT: 0.99 ug{FEU}/mL — AB (ref <0.50)

## 2018-07-14 NOTE — Patient Instructions (Addendum)
Protonix should be Take 30- 60 min before your first and last meals of the day   GERD (REFLUX)  is an extremely common cause of respiratory symptoms just like yours , many times with no obvious heartburn at all.    It can be treated with medication, but also with lifestyle changes including elevation of the head of your bed (ideally with 6 -8inch blocks under the headboard of your bed),  Smoking cessation, avoidance of late meals, excessive alcohol, and avoid fatty foods, chocolate, peppermint, colas, red wine, and acidic juices such as orange juice.  NO MINT OR MENTHOL PRODUCTS SO NO COUGH DROPS  USE SUGARLESS CANDY INSTEAD (Jolley ranchers or Stover's or Life Savers) or even ice chips will also do - the key is to swallow to prevent all throat clearing. NO OIL BASED VITAMINS - use powdered substitutes.  Avoid fish oil when coughing.    Please remember to go to the lab department   for your tests - we will call you with the results when they are available.      Please schedule a follow up office visit in 4 weeks, sooner if needed with pfts on return

## 2018-07-14 NOTE — Progress Notes (Signed)
Ryan Hunt, male    DOB: 1930-07-19     MRN: [id number]    Brief patient profile:  29 yowm quit smoking in 1973 eval in pulmonary clinic 2014 with unexplained sob with nl pfts and improved off acei and on gerd rx referred back to pulmonary clinic 03/25/2018 by Dr Delfina Redwood with [facility]  02/27/18 LVEDP upper normal at 17 mmHg.  EF estimated to be 55%.    History of Present Illness  03/25/2018  Pulmonary consultation/ [name] back on ACEi and no longer on gerd rx  Chief Complaint  Patient presents with  . 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 Feels even sob at rest sometimes but never noct  Cough: daytime clearing throat, very hoarse  Sleep: flat/ one pillows SABA use: none  Lost wife March 2019 "eating out too much now and gaining wt" rec Stop lisinopril and start micardis 80 mg daily in its place Try prilosec otc 20mg   Take 30-60 min before first meal of the day and Pepcid ac (famotidine) 20 mg one @  bedtime until cough / hoarseness completely gone for at least a week  GERD diet     05/08/2018  f/u ov/Analena Gama re: worsening sob despite off acei and on otc gerd rx / did not bring meds as req Chief Complaint  Patient presents with  . Follow-up    worse since last visit-SOB with exertion and eating, wheezing, slight cough with gray mucus, feels like mucus gets stuck.   Dyspnea:  X 50 ft reported (but not reproduced)  Cough: feels like something is stuck in throat but doesn't wake him /very min actual mucus production  Sleeping: on side / bricks under hb SABA use: none  rec Stop prilosec Pantoprazole (protonix) 40 mg  Take 30- 60 min before your first and last meals of the day    - this is the best way to tell whether stomach acid is contributing to your problem.   Please remember to go to the lab and x-ray department downstairs in the basement  for your tests - we will  call you with the results when they are available. Please schedule a follow up office visit in 2 weeks, sooner if needed  with all medications /inhalers/ solutions in hand so we can verify exactly what you are taking. This includes all medications from all doctors and over the counters     07/14/2018  f/u ov/[name] re:  Cough and sob x 09/2017  Chief Complaint  Patient presents with  . Acute Visit    Breathing not improving since the last visit. He states he gets SOB "just moving around".  Dyspnea:  Doe so now riding a scooter at [employer] Cough: better / still feels globus with swallowing Sleeping: bricks under headboard as rec  SABA use: no  02: none Taking ppi bid with meals not ac   No obvious day to day or daytime variability or assoc excess/ purulent sputum or mucus plugs or hemoptysis or cp or chest tightness, subjective wheeze or overt sinus or hb symptoms.   Sleeping as above  without nocturnal  or early am exacerbation  of respiratory  c/o's or need for noct saba. Also denies any obvious fluctuation of symptoms with weather or environmental changes or other aggravating or alleviating factors except as outlined  above   No unusual exposure hx or h/o childhood pna/ asthma or knowledge of premature birth.  Current Allergies, Complete Past Medical History, Past Surgical History, Family History, and Social History were reviewed in [employer] record.  ROS  The following are not active complaints unless bolded Hoarseness, sore throat = globus sensation, dysphagia, dental problems, itching, sneezing,  nasal congestion or discharge of excess mucus or purulent secretions, ear ache,   fever, chills, sweats, unintended wt loss or wt gain, classically pleuritic or exertional cp,  orthopnea pnd or arm/hand swelling  or leg swelling, presyncope, palpitations, abdominal pain, anorexia, nausea, vomiting, diarrhea  or change in bowel habits or change in bladder habits, change in  stools or change in urine, dysuria, hematuria,  rash, arthralgias, visual complaints, headache, numbness, weakness or ataxia or problems with walking or coordination,  change in mood or  memory.        Current Meds  Medication Sig  . amLODipine (NORVASC) 10 MG tablet Take 1 tablet (10 mg total) by mouth daily.  Marland Kitchen aspirin EC 81 MG tablet Take 1 tablet (81 mg total) by mouth daily.  Marland Kitchen CALCIUM PO Take 600 mg by mouth at bedtime.  . Cyanocobalamin (B-12 PO) Take 1,000 mcg by mouth at bedtime.  . docusate calcium (SURFAK) 240 MG capsule Take 240 mg by mouth daily.   . furosemide (LASIX) 40 MG tablet Take 40 mg by mouth daily.  Marland Kitchen glimepiride (AMARYL) 2 MG tablet Take 2 mg by mouth 2 (two) times daily.  . isosorbide mononitrate (IMDUR) 30 MG 24 hr tablet Take 30 mg by mouth daily.  . metFORMIN (GLUCOPHAGE) 500 MG tablet Take 500 mg by mouth 2 (two) times daily with a meal.  . nitroGLYCERIN (NITROSTAT) 0.4 MG SL tablet Place 0.4 mg under the tongue every 5 (five) minutes as needed. For chest pain  . pantoprazole (PROTONIX) 40 MG tablet Take 30- 60 min before your first and last meals of the day  . rosuvastatin (CRESTOR) 10 MG tablet Take 10 mg by mouth daily.  Marland Kitchen telmisartan (MICARDIS) 80 MG tablet Take 1 tablet (80 mg total) by mouth daily.  . traMADol (ULTRAM) 50 MG tablet Take 50 mg by mouth every 12 (twelve) hours as needed for moderate pain.            Objective:     amb wm mild voice fatigue    07/14/2018         223  05/08/2018     226   03/25/18 220 lb 9.6 oz (100.1 kg)  02/28/18 216 lb 4.8 oz (98.1 kg)  02/19/18 225 lb 6.4 oz (102.2 kg)      Vital signs reviewed - Note on arrival 02 sats  97% on RA      HEENT: edentulous/ nl  turbinates bilaterally, and oropharynx. Nl external ear canals without cough reflex   NECK :  without JVD/Nodes/TM/ nl carotid upstrokes bilaterally   LUNGS: no acc muscle use,  Nl contour chest which is clear to A and P bilaterally without cough on insp  or exp maneuvers   CV:  RRR  no s3 or murmur or increase in P2, and trace pitting bilateral LE  edema   ABD:  Quite obese but soft and nontender with nl inspiratory excursion in the supine position. No bruits or organomegaly appreciated, bowel sounds nl  MS:  Nl gait/ ext warm without deformities, calf tenderness, cyanosis or clubbing No obvious joint restrictions  SKIN: warm and dry without lesions    NEURO:  alert, approp, nl sensorium with  no motor or cerebellar deficits apparent.        Labs ordered/ reviewed:     Chemistry      Component Value Date/Time   NA 140 07/14/2018 1658   NA 140 02/19/2018 1545   K 4.1 07/14/2018 1658   CL 106 07/14/2018 1658   CO2 28 07/14/2018 1658   BUN 28 (H) 07/14/2018 1658   BUN 18 02/19/2018 1545   CREATININE 1.60 (H) 07/14/2018 1658      Component Value Date/Time   CALCIUM 10.1 07/14/2018 1658   ALKPHOS 52 02/26/2018 2014   AST 45 (H) 02/26/2018 2014   ALT 32 02/26/2018 2014   BILITOT 1.0 02/26/2018 2014        Lab Results  Component Value Date   WBC 4.6 07/14/2018   HGB 11.5 (L) 07/14/2018   HCT 33.7 (L) 07/14/2018   MCV 89.7 07/14/2018   PLT 114.0 (L) 07/14/2018     Lab Results  Component Value Date   DDIMER 0.99 (H) 07/14/2018      Lab Results  Component Value Date   TSH 2.00 05/08/2018     Lab Results  Component Value Date   PROBNP 147.0 (H) 07/14/2018                  Assessment

## 2018-07-15 ENCOUNTER — Telehealth: Payer: Self-pay | Admitting: Internal Medicine

## 2018-07-15 ENCOUNTER — Encounter: Payer: Self-pay | Admitting: Internal Medicine

## 2018-07-15 DIAGNOSIS — R05 Cough: Secondary | ICD-10-CM | POA: Insufficient documentation

## 2018-07-15 DIAGNOSIS — R058 Other specified cough: Secondary | ICD-10-CM | POA: Insufficient documentation

## 2018-07-15 LAB — CBC WITH DIFFERENTIAL/PLATELET
BASOS ABS: 0 10*3/uL (ref 0.0–0.1)
Basophils Relative: 0.4 % (ref 0.0–3.0)
EOS ABS: 0.2 10*3/uL (ref 0.0–0.7)
Eosinophils Relative: 3.4 % (ref 0.0–5.0)
HCT: 33.7 % — ABNORMAL LOW (ref 39.0–52.0)
Hemoglobin: 11.5 g/dL — ABNORMAL LOW (ref 13.0–17.0)
LYMPHS ABS: 1.2 10*3/uL (ref 0.7–4.0)
Lymphocytes Relative: 27.1 % (ref 12.0–46.0)
MCHC: 34 g/dL (ref 30.0–36.0)
MCV: 89.7 fl (ref 78.0–100.0)
MONO ABS: 0.5 10*3/uL (ref 0.1–1.0)
MONOS PCT: 10.3 % (ref 3.0–12.0)
NEUTROS PCT: 58.8 % (ref 43.0–77.0)
Neutro Abs: 2.7 10*3/uL (ref 1.4–7.7)
PLATELETS: 114 10*3/uL — AB (ref 150.0–400.0)
RBC: 3.75 Mil/uL — ABNORMAL LOW (ref 4.22–5.81)
RDW: 14.8 % (ref 11.5–15.5)
WBC: 4.6 10*3/uL (ref 4.0–10.5)

## 2018-07-15 LAB — BASIC METABOLIC PANEL
BUN: 28 mg/dL — AB (ref 6–23)
CHLORIDE: 106 meq/L (ref 96–112)
CO2: 28 mEq/L (ref 19–32)
Calcium: 10.1 mg/dL (ref 8.4–10.5)
Creatinine, Ser: 1.6 mg/dL — ABNORMAL HIGH (ref 0.40–1.50)
GFR: 43.79 mL/min — AB (ref >60.00)
Glucose, Bld: 195 mg/dL — ABNORMAL HIGH (ref 70–99)
POTASSIUM: 4.1 meq/L (ref 3.5–5.1)
Sodium: 140 mEq/L (ref 135–145)

## 2018-07-15 LAB — BRAIN NATRIURETIC PEPTIDE: PRO B NATRI PEPTIDE: 147 pg/mL — AB (ref 0.0–100.0)

## 2018-07-15 NOTE — Assessment & Plan Note (Addendum)
-   04/02/2013  Walked RA x 3 laps @ 185 ft each stopped due to end of study, no desats - PFT's  04/17/13  wnl  Including fef 25-75 and f/v loop RESOLVED OFF ACEi/ recurred while on ACEi  09/2107 - [facility]  02/27/18 LVEDP upper normal at 17 mmHg.  EF estimated to be 55%. - Rec try off acei 03/25/18  - 05/08/2018  Walked RA x 3 laps @ 185 ft each stopped due to  End of study, nl pace, no  desat - sob at end - Spirometry 05/08/2018  FEV1 2.2 (95%)  Ratio 80 min curvature with active "wheeze"on exam      - 05/08/2018  Walked RA x 3 laps @ 185 ft each stopped due to  End of study, nl pace, no desat but mild sob   - 07/14/2018   Walked RA x one lap = 250 ft - stopped due to  Sob at avg pace s desats    Previously reported no problem with bicycle ergometry and if still doing this s sob then problem is obesity related much more likely than related to pulmonary / cardiac issue > also should be able to do cpst on bike to prove it.    In meantime not taking ppi as rec and should try this plus return for full pfts int 4 weeks and sort out then whether to do cpst next

## 2018-07-15 NOTE — Progress Notes (Signed)
LMTCB

## 2018-07-15 NOTE — Assessment & Plan Note (Signed)
RESOLVED OFF ACEi/ recurred while on ACEi  09/2107  Upper airway cough syndrome (previously labeled PNDS),  is so named because it's frequently impossible to sort out how much is  CR/sinusitis with freq throat clearing (which can be related to primary GERD)   vs  causing  secondary (" extra esophageal")  GERD from wide swings in gastric pressure that occur with throat clearing, often  promoting self use of mint and menthol lozenges that reduce the lower esophageal sphincter tone and exacerbate the problem further in a cyclical fashion.   These are the same pts (now being labeled as having "irritable larynx syndrome" by some cough centers) who not infrequently have a history of having failed to tolerate ace inhibitors(which he has x 2),  dry powder inhalers or biphosphonates or report having atypical/extraesophageal reflux symptoms that don't respond to standard doses of PPI  and are easily confused as having aecopd or asthma flares by even experienced allergists/ pulmonologists (myself included).     >>>  Still not using ppi correctly, rec he take ppi bid ac the return for full pfts with f/v loop to look at upper airway function and r/o lower airflow obst on return in 4 weeks.    I had an extended discussion with the patient  reviewing all relevant studies completed to date and  lasting 15 to 20 minutes of a 25 minute visit  which included directly observing ambulatory 02 saturation study documented in a/p section of  today's  office note.  Each maintenance medication was reviewed in detail including most importantly the difference between maintenance and prns and under what circumstances the prns are to be triggered using an action plan format that is not reflected in the computer generated alphabetically organized AVS.     Please see AVS for specific instructions unique to this visit that I personally wrote and verbalized to the the pt in detail and then reviewed with pt  by my nurse highlighting  any changes in therapy recommended at today's visit .

## 2018-07-15 NOTE — Telephone Encounter (Signed)
Called and spoke with patient, he is aware of results and verbalized understanding. Patient would rather wait until next OV to discuss further testing.

## 2018-07-17 ENCOUNTER — Encounter: Payer: Self-pay | Admitting: Internal Medicine

## 2018-08-07 DIAGNOSIS — E1165 Type 2 diabetes mellitus with hyperglycemia: Secondary | ICD-10-CM | POA: Diagnosis not present

## 2018-08-07 DIAGNOSIS — E1169 Type 2 diabetes mellitus with other specified complication: Secondary | ICD-10-CM | POA: Diagnosis not present

## 2018-08-07 DIAGNOSIS — I2510 Atherosclerotic heart disease of native coronary artery without angina pectoris: Secondary | ICD-10-CM | POA: Diagnosis not present

## 2018-08-07 DIAGNOSIS — I10 Essential (primary) hypertension: Secondary | ICD-10-CM | POA: Diagnosis not present

## 2018-08-07 DIAGNOSIS — I5030 Unspecified diastolic (congestive) heart failure: Secondary | ICD-10-CM | POA: Diagnosis not present

## 2018-08-07 DIAGNOSIS — E785 Hyperlipidemia, unspecified: Secondary | ICD-10-CM | POA: Diagnosis not present

## 2018-08-07 DIAGNOSIS — N183 Chronic kidney disease, stage 3 (moderate): Secondary | ICD-10-CM | POA: Diagnosis not present

## 2018-08-07 DIAGNOSIS — F4321 Adjustment disorder with depressed mood: Secondary | ICD-10-CM | POA: Diagnosis not present

## 2018-08-07 DIAGNOSIS — Z8546 Personal history of malignant neoplasm of prostate: Secondary | ICD-10-CM | POA: Diagnosis not present

## 2018-08-07 DIAGNOSIS — C61 Malignant neoplasm of prostate: Secondary | ICD-10-CM | POA: Diagnosis not present

## 2018-08-08 ENCOUNTER — Other Ambulatory Visit: Payer: Self-pay | Admitting: *Deleted

## 2018-08-08 DIAGNOSIS — R0609 Other forms of dyspnea: Principal | ICD-10-CM

## 2018-08-11 ENCOUNTER — Ambulatory Visit (INDEPENDENT_AMBULATORY_CARE_PROVIDER_SITE_OTHER): Payer: PPO | Admitting: Internal Medicine

## 2018-08-11 ENCOUNTER — Other Ambulatory Visit: Payer: Self-pay | Admitting: Internal Medicine

## 2018-08-11 ENCOUNTER — Encounter: Payer: Self-pay | Admitting: Internal Medicine

## 2018-08-11 VITALS — BP 122/66 | HR 63 | Ht 67.5 in | Wt 220.0 lb

## 2018-08-11 DIAGNOSIS — R0609 Other forms of dyspnea: Secondary | ICD-10-CM

## 2018-08-11 DIAGNOSIS — R058 Other specified cough: Secondary | ICD-10-CM

## 2018-08-11 DIAGNOSIS — R05 Cough: Secondary | ICD-10-CM | POA: Diagnosis not present

## 2018-08-11 LAB — PULMONARY FUNCTION TEST
DL/VA % PRED: 85 %
DL/VA: 3.77 ml/min/mmHg/L
DLCO COR % PRED: 64 %

## 2018-08-11 NOTE — Assessment & Plan Note (Signed)
Quit smoking 1973  - 04/02/2013  Walked RA x 3 laps @ 185 ft each stopped due to end of study, no desats - PFT's  04/17/13  wnl  Including fef 25-75 and f/v loop  - [facility]  02/27/18 LVEDP upper normal at 17 mmHg.  EF estimated to be 55%. - Rec try off acei 03/25/18  - 05/08/2018  Walked RA x 3 laps @ 185 ft each stopped due to  End of study, nl pace, no  desat - sob at end - Spirometry 05/08/2018  FEV1 2.2 (95%)  Ratio 80 min curvature with active "wheeze"on exam      - 05/08/2018  Walked RA x 3 laps @ 185 ft each stopped due to  End of study, nl pace, no desat but mild sob   - 07/14/2018   Walked RA x one lap = 250 ft - stopped due to  Sob at avg pace s desats  PFT's  08/11/2018  FEV1 2.40 (102 % ) ratio 0.75  p 0 % improvement from saba p nothing prior to study with DLCO  58/64c % corrects to 85 % for alv volume   - 08/11/2018   Walked RA  3 laps @  approx 239ft each @ slow pace  stopped due to  End of study, denied sob   Unable to reproduce any doe at this point but overally pattern is most c/w low grade diastolic dysfunction and obesity/ deconditioning, not pulmonary dz and there is no indication for pulmonary meds   Advised to restart non-wt bearing aerobics (eg ex bike) building up to a min of 30 min daily if possible and f/u with PCP/ cards but nothing further needed from this office.   I had an extended discussion with the patient  reviewing all relevant studies completed to date and  lasting 15 to 20 minutes of a 25 minute visit  which included directly observing ambulatory 02 saturation study documented in a/p section of  today's  office note.  Each maintenance medication was reviewed in detail including most importantly the difference between maintenance and prns and under what circumstances the prns are to be triggered using an action plan format that is not reflected in the computer generated alphabetically organized AVS.     Please see AVS for specific instructions unique to this visit that  I personally wrote and verbalized to the the pt in detail and then reviewed with pt  by my nurse highlighting any changes in therapy recommended at today's visit .

## 2018-08-11 NOTE — Progress Notes (Signed)
PFT done today. 

## 2018-08-11 NOTE — Assessment & Plan Note (Addendum)
Original onset 2014 on acei > recurred on ACEi  09/2107> d/c'd  - F/v loop nl 08/11/2018 s insp truncation   Still has some hoarseness and sense of globus on ppi bid ac so next step if not improving = > refer to [facility]/ Dr Bettina Gavia at the voice center.   No further pulmonary f/u planned

## 2018-08-11 NOTE — Patient Instructions (Addendum)
Weight control is simply a matter of calorie balance which needs to be tilted in your favor by eating less and exercising more.  To get the most out of exercise, you need to be continuously aware that you are short of breath, but never out of breath, for 30 minutes daily. As you improve, it will actually be easier for you to do the same amount of exercise  in  30 minutes so always push to the level where you are short of breath.  If this does not result in gradual weight reduction then I strongly recommend you see a nutritionist with a food diary x 2 weeks so that we can work out a negative calorie balance which is universally effective in steady weight loss programs.  Think of your calorie balance like you do your bank account where in this case you want the balance to go down so you must take in less calories than you burn up.  It's just that simple:  Hard to do, but easy to understand.  Good luck!    Pulmonary follow up is as needed - if cough / throat continue to bother you, we can refer you to the voice center at [facility]

## 2018-08-11 NOTE — Progress Notes (Signed)
Ryan Hunt, male    DOB: [birth year]     MRN: [id number]    Brief patient profile:  38 yowm quit smoking in 1973 eval in pulmonary clinic 2014 with unexplained sob with nl pfts and improved off acei and on gerd rx referred back to pulmonary clinic 03/25/2018 by Dr Delfina Redwood with [facility]  02/27/18 LVEDP upper normal at 17 mmHg.  EF estimated to be 55%.    History of Present Illness  03/25/2018  Pulmonary consultation/ [name] back on ACEi and no longer on gerd rx  Chief Complaint  Patient presents with  . 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 Feels even sob at rest sometimes but never noct  Cough: daytime clearing throat, very hoarse  Sleep: flat/ one pillows SABA use: none  Lost wife March 2019 "eating out too much now and gaining wt" rec Stop lisinopril and start micardis 80 mg daily in its place Try prilosec otc 20mg   Take 30-60 min before first meal of the day and Pepcid ac (famotidine) 20 mg one @  bedtime until cough / hoarseness completely gone for at least a week  GERD diet     05/08/2018  f/u ov/[name] re: worsening sob despite off acei and on otc gerd rx / did not bring meds as req Chief Complaint  Patient presents with  . Follow-up    worse since last visit-SOB with exertion and eating, wheezing, slight cough with gray mucus, feels like mucus gets stuck.   Dyspnea:  X 50 ft reported (but not reproduced)  Cough: feels like something is stuck in throat but doesn't wake him /very min actual mucus production  Sleeping: on side / bricks under hb SABA use: none  rec Stop prilosec Pantoprazole (protonix) 40 mg  Take 30- 60 min before your first and last meals of the day    - this is the best way to tell whether stomach acid is contributing to your problem.  . Please schedule a follow up office visit in 2 weeks, sooner if needed  with all medications /inhalers/  solutions in hand so we can verify exactly what you are taking. This includes all medications from all doctors and over the counters     07/14/2018  f/u ov/[name] re:  Cough and sob x 09/2017  Chief Complaint  Patient presents with  . Acute Visit    Breathing not improving since the last visit. He states he gets SOB "just moving around".  Dyspnea:  Doe so now riding a scooter at [employer] Cough: better / still feels globus with swallowing Sleeping: bricks under headboard as rec  SABA use: no   Taking ppi bid with meals not ac rec Protonix should be Take 30- 60 min before your first and last meals of the day  GERD diet         08/11/2018  f/u ov/[name] re: cough and sob x spring 2019  Chief Complaint  Patient presents with  . Results    PFT - Dyspnea on exertion  Dyspnea:  Across a parking lot so sob has to use scooter in all stores that have them available) Cough: about the same, still hoarse, dry daytime cough Sleeping: fine with blokcs SABA use: none  02: none    No obvious day to day or daytime variability or  assoc excess/ purulent sputum or mucus plugs or hemoptysis or cp or chest tightness, subjective wheeze or overt sinus or hb symptoms.   Sleeping as above  without nocturnal  or early am exacerbation  of respiratory  c/o's or need for noct saba. Also denies any obvious fluctuation of symptoms with weather or environmental changes or other aggravating or alleviating factors except as outlined above   No unusual exposure hx or h/o childhood pna/ asthma or knowledge of premature birth.  Current Allergies, Complete Past Medical History, Past Surgical History, Family History, and Social History were reviewed in [employer] record.  ROS  The following are not active complaints unless bolded Hoarseness, sore throat, dysphagia, dental problems, itching, sneezing,  nasal congestion or discharge of excess mucus or purulent secretions, ear ache,   fever, chills,  sweats, unintended wt loss or wt gain, classically pleuritic or exertional cp,  orthopnea pnd or arm/hand swelling  or leg swelling, presyncope, palpitations, abdominal pain, anorexia, nausea, vomiting, diarrhea  or change in bowel habits or change in bladder habits, change in stools or change in urine, dysuria, hematuria,  rash, arthralgias/chronic back pain, visual complaints, headache, numbness, weakness or ataxia or problems with walking or coordination,  change in mood or  memory.        Current Meds  Medication Sig  . aspirin EC 81 MG tablet Take 1 tablet (81 mg total) by mouth daily.  Marland Kitchen CALCIUM PO Take 600 mg by mouth at bedtime.  . Cyanocobalamin (B-12 PO) Take 1,000 mcg by mouth at bedtime.  . docusate calcium (SURFAK) 240 MG capsule Take 240 mg by mouth daily.   . furosemide (LASIX) 40 MG tablet Take 40 mg by mouth daily.  Marland Kitchen glimepiride (AMARYL) 2 MG tablet Take 2 mg by mouth 2 (two) times daily.  . isosorbide mononitrate (IMDUR) 30 MG 24 hr tablet Take 30 mg by mouth daily.  . metFORMIN (GLUCOPHAGE) 500 MG tablet Take 500 mg by mouth 2 (two) times daily with a meal.  . nitroGLYCERIN (NITROSTAT) 0.4 MG SL tablet Place 0.4 mg under the tongue every 5 (five) minutes as needed. For chest pain  . ONE TOUCH ULTRA TEST test strip   . pantoprazole (PROTONIX) 40 MG tablet Take 30- 60 min before your first and last meals of the day  . rosuvastatin (CRESTOR) 10 MG tablet Take 10 mg by mouth daily.  Marland Kitchen telmisartan (MICARDIS) 80 MG tablet Take 1 tablet (80 mg total) by mouth daily.  . traMADol (ULTRAM) 50 MG tablet Take 50 mg by mouth every 12 (twelve) hours as needed for moderate pain.             Objective:      amb hoarse wm nad / freq throat clearing   08/11/2018         220  07/14/2018         223  05/08/2018     226   03/25/18 220 lb 9.6 oz (100.1 kg)  02/28/18 216 lb 4.8 oz (98.1 kg)  02/19/18 225 lb 6.4 oz (102.2 kg)      Vital signs reviewed - Note on arrival 02 sats  94% on RA      HEENT:  Edentulous, nl  turbinates bilaterally, and oropharynx. Nl external ear canals without cough reflex   NECK :  without JVD/Nodes/TM/ nl carotid upstrokes bilaterally   LUNGS: no acc muscle use,  Nl contour chest which is clear to A and P bilaterally  without cough on insp or exp maneuvers   CV:  RRR  no s3 or murmur or increase in P2, and  Trace sym ankle pitting edema bilaterally   ABD:  Quite obese but soft and nontender with nl inspiratory excursion in the supine position. No bruits or organomegaly appreciated, bowel sounds nl  MS:  Nl gait/ ext warm without deformities, calf tenderness, cyanosis or clubbing No obvious joint restrictions   SKIN: warm and dry without lesions    NEURO:  alert, approp, nl sensorium with  no motor or cerebellar deficits apparent.                Assessment

## 2018-08-22 DIAGNOSIS — Z8546 Personal history of malignant neoplasm of prostate: Secondary | ICD-10-CM | POA: Diagnosis not present

## 2018-08-22 DIAGNOSIS — N183 Chronic kidney disease, stage 3 (moderate): Secondary | ICD-10-CM | POA: Diagnosis not present

## 2018-08-22 DIAGNOSIS — C61 Malignant neoplasm of prostate: Secondary | ICD-10-CM | POA: Diagnosis not present

## 2018-08-22 DIAGNOSIS — E785 Hyperlipidemia, unspecified: Secondary | ICD-10-CM | POA: Diagnosis not present

## 2018-08-22 DIAGNOSIS — I2510 Atherosclerotic heart disease of native coronary artery without angina pectoris: Secondary | ICD-10-CM | POA: Diagnosis not present

## 2018-08-22 DIAGNOSIS — E1165 Type 2 diabetes mellitus with hyperglycemia: Secondary | ICD-10-CM | POA: Diagnosis not present

## 2018-08-22 DIAGNOSIS — I10 Essential (primary) hypertension: Secondary | ICD-10-CM | POA: Diagnosis not present

## 2018-08-22 DIAGNOSIS — F4321 Adjustment disorder with depressed mood: Secondary | ICD-10-CM | POA: Diagnosis not present

## 2018-08-22 DIAGNOSIS — I5030 Unspecified diastolic (congestive) heart failure: Secondary | ICD-10-CM | POA: Diagnosis not present

## 2018-08-22 DIAGNOSIS — E1169 Type 2 diabetes mellitus with other specified complication: Secondary | ICD-10-CM | POA: Diagnosis not present

## 2018-08-30 ENCOUNTER — Other Ambulatory Visit: Payer: Self-pay | Admitting: Interventional Cardiology

## 2018-11-05 DIAGNOSIS — C61 Malignant neoplasm of prostate: Secondary | ICD-10-CM | POA: Diagnosis not present

## 2018-11-05 DIAGNOSIS — I5030 Unspecified diastolic (congestive) heart failure: Secondary | ICD-10-CM | POA: Diagnosis not present

## 2018-11-05 DIAGNOSIS — I10 Essential (primary) hypertension: Secondary | ICD-10-CM | POA: Diagnosis not present

## 2018-11-05 DIAGNOSIS — Z8546 Personal history of malignant neoplasm of prostate: Secondary | ICD-10-CM | POA: Diagnosis not present

## 2018-11-05 DIAGNOSIS — E7800 Pure hypercholesterolemia, unspecified: Secondary | ICD-10-CM | POA: Diagnosis not present

## 2018-11-05 DIAGNOSIS — E1165 Type 2 diabetes mellitus with hyperglycemia: Secondary | ICD-10-CM | POA: Diagnosis not present

## 2018-11-05 DIAGNOSIS — F4321 Adjustment disorder with depressed mood: Secondary | ICD-10-CM | POA: Diagnosis not present

## 2018-11-05 DIAGNOSIS — N183 Chronic kidney disease, stage 3 (moderate): Secondary | ICD-10-CM | POA: Diagnosis not present

## 2018-11-05 DIAGNOSIS — I2510 Atherosclerotic heart disease of native coronary artery without angina pectoris: Secondary | ICD-10-CM | POA: Diagnosis not present

## 2018-12-21 LAB — PULMONARY FUNCTION TEST
DL/VA % pred: 104 %
DL/VA: 4.63 ml/min/mmHg/L
DLCO unc % pred: 81 %
DLCO unc: 23.74 ml/min/mmHg
FEF 25-75 Post: 2.66 L/sec
FEF 25-75 Pre: 1.96 L/sec
FEF2575-%Change-Post: 35 %
FEF2575-%PRED-POST: 155 %
FEF2575-%Pred-Pre: 114 %
FEV1-%Change-Post: 9 %
FEV1-%PRED-POST: 102 %
FEV1-%Pred-Pre: 94 %
FEV1-PRE: 2.38 L
FEV1-Post: 2.6 L
FEV1FVC-%Change-Post: 3 %
FEV1FVC-%PRED-PRE: 107 %
FEV6-%CHANGE-POST: 6 %
FEV6-%Pred-Post: 98 %
FEV6-%Pred-Pre: 92 %
FEV6-Post: 3.28 L
FEV6-Pre: 3.09 L
FEV6FVC-%CHANGE-POST: 0 %
FEV6FVC-%Pred-Post: 107 %
FEV6FVC-%Pred-Pre: 106 %
FVC-%CHANGE-POST: 5 %
FVC-%PRED-POST: 91 %
FVC-%PRED-PRE: 86 %
FVC-Post: 3.28 L
POST FEV1/FVC RATIO: 79 %
PRE FEV1/FVC RATIO: 77 %
Post FEV6/FVC ratio: 100 %
Pre FEV6/FVC Ratio: 99 %
RV % PRED: 82 %
RV: 2.08 L
TLC % PRED: 78 %
TLC: 5.18 L

## 2018-12-22 DIAGNOSIS — R21 Rash and other nonspecific skin eruption: Secondary | ICD-10-CM | POA: Diagnosis not present

## 2019-01-20 DIAGNOSIS — Z1389 Encounter for screening for other disorder: Secondary | ICD-10-CM | POA: Diagnosis not present

## 2019-01-20 DIAGNOSIS — D649 Anemia, unspecified: Secondary | ICD-10-CM | POA: Diagnosis not present

## 2019-01-20 DIAGNOSIS — E1169 Type 2 diabetes mellitus with other specified complication: Secondary | ICD-10-CM | POA: Diagnosis not present

## 2019-01-20 DIAGNOSIS — E7800 Pure hypercholesterolemia, unspecified: Secondary | ICD-10-CM | POA: Diagnosis not present

## 2019-01-20 DIAGNOSIS — N183 Chronic kidney disease, stage 3 (moderate): Secondary | ICD-10-CM | POA: Diagnosis not present

## 2019-01-20 DIAGNOSIS — I5030 Unspecified diastolic (congestive) heart failure: Secondary | ICD-10-CM | POA: Diagnosis not present

## 2019-01-20 DIAGNOSIS — Z0000 Encounter for general adult medical examination without abnormal findings: Secondary | ICD-10-CM | POA: Diagnosis not present

## 2019-01-20 DIAGNOSIS — H9190 Unspecified hearing loss, unspecified ear: Secondary | ICD-10-CM | POA: Diagnosis not present

## 2019-01-20 DIAGNOSIS — I10 Essential (primary) hypertension: Secondary | ICD-10-CM | POA: Diagnosis not present

## 2019-01-20 DIAGNOSIS — I2510 Atherosclerotic heart disease of native coronary artery without angina pectoris: Secondary | ICD-10-CM | POA: Diagnosis not present

## 2019-01-20 DIAGNOSIS — Z8546 Personal history of malignant neoplasm of prostate: Secondary | ICD-10-CM | POA: Diagnosis not present

## 2019-01-28 DIAGNOSIS — D509 Iron deficiency anemia, unspecified: Secondary | ICD-10-CM | POA: Diagnosis not present

## 2019-01-29 DIAGNOSIS — D649 Anemia, unspecified: Secondary | ICD-10-CM | POA: Diagnosis not present

## 2019-02-02 DIAGNOSIS — H26491 Other secondary cataract, right eye: Secondary | ICD-10-CM | POA: Diagnosis not present

## 2019-02-02 DIAGNOSIS — Z961 Presence of intraocular lens: Secondary | ICD-10-CM | POA: Diagnosis not present

## 2019-02-02 DIAGNOSIS — R195 Other fecal abnormalities: Secondary | ICD-10-CM | POA: Diagnosis not present

## 2019-02-02 DIAGNOSIS — N183 Chronic kidney disease, stage 3 (moderate): Secondary | ICD-10-CM | POA: Diagnosis not present

## 2019-02-02 DIAGNOSIS — H04123 Dry eye syndrome of bilateral lacrimal glands: Secondary | ICD-10-CM | POA: Diagnosis not present

## 2019-02-02 DIAGNOSIS — H26492 Other secondary cataract, left eye: Secondary | ICD-10-CM | POA: Diagnosis not present

## 2019-02-02 DIAGNOSIS — I5030 Unspecified diastolic (congestive) heart failure: Secondary | ICD-10-CM | POA: Diagnosis not present

## 2019-02-02 DIAGNOSIS — D509 Iron deficiency anemia, unspecified: Secondary | ICD-10-CM | POA: Diagnosis not present

## 2019-02-10 DIAGNOSIS — K921 Melena: Secondary | ICD-10-CM | POA: Diagnosis not present

## 2019-02-10 DIAGNOSIS — E1165 Type 2 diabetes mellitus with hyperglycemia: Secondary | ICD-10-CM | POA: Diagnosis not present

## 2019-02-10 DIAGNOSIS — N183 Chronic kidney disease, stage 3 (moderate): Secondary | ICD-10-CM | POA: Diagnosis not present

## 2019-02-10 DIAGNOSIS — D500 Iron deficiency anemia secondary to blood loss (chronic): Secondary | ICD-10-CM | POA: Diagnosis not present

## 2019-02-10 DIAGNOSIS — Z8546 Personal history of malignant neoplasm of prostate: Secondary | ICD-10-CM | POA: Diagnosis not present

## 2019-02-10 DIAGNOSIS — K219 Gastro-esophageal reflux disease without esophagitis: Secondary | ICD-10-CM | POA: Diagnosis not present

## 2019-02-13 ENCOUNTER — Other Ambulatory Visit (HOSPITAL_COMMUNITY): Payer: Self-pay | Admitting: *Deleted

## 2019-02-16 ENCOUNTER — Other Ambulatory Visit: Payer: Self-pay

## 2019-02-16 ENCOUNTER — Ambulatory Visit (HOSPITAL_COMMUNITY)
Admission: RE | Admit: 2019-02-16 | Discharge: 2019-02-16 | Disposition: A | Discharge status: Home / Self Care | Payer: PPO | Source: Ambulatory Visit | Attending: Gastroenterology | Admitting: Gastroenterology

## 2019-02-16 DIAGNOSIS — D500 Iron deficiency anemia secondary to blood loss (chronic): Secondary | ICD-10-CM | POA: Insufficient documentation

## 2019-02-16 MED ORDER — SODIUM CHLORIDE 0.9 % IV SOLN
510.0000 mg | INTRAVENOUS | Status: DC
  Administered 2019-02-16: 510 mg via INTRAVENOUS
  Filled 2019-02-16: qty 17

## 2019-02-16 NOTE — Discharge Instructions (Signed)

## 2019-02-19 DIAGNOSIS — I10 Essential (primary) hypertension: Secondary | ICD-10-CM | POA: Diagnosis not present

## 2019-02-19 DIAGNOSIS — N183 Chronic kidney disease, stage 3 (moderate): Secondary | ICD-10-CM | POA: Diagnosis not present

## 2019-02-19 DIAGNOSIS — E1165 Type 2 diabetes mellitus with hyperglycemia: Secondary | ICD-10-CM | POA: Diagnosis not present

## 2019-02-19 DIAGNOSIS — D509 Iron deficiency anemia, unspecified: Secondary | ICD-10-CM | POA: Diagnosis not present

## 2019-02-19 DIAGNOSIS — I2510 Atherosclerotic heart disease of native coronary artery without angina pectoris: Secondary | ICD-10-CM | POA: Diagnosis not present

## 2019-02-19 DIAGNOSIS — I5030 Unspecified diastolic (congestive) heart failure: Secondary | ICD-10-CM | POA: Diagnosis not present

## 2019-02-19 DIAGNOSIS — D500 Iron deficiency anemia secondary to blood loss (chronic): Secondary | ICD-10-CM | POA: Diagnosis not present

## 2019-02-19 DIAGNOSIS — Z8546 Personal history of malignant neoplasm of prostate: Secondary | ICD-10-CM | POA: Diagnosis not present

## 2019-02-19 DIAGNOSIS — E7800 Pure hypercholesterolemia, unspecified: Secondary | ICD-10-CM | POA: Diagnosis not present

## 2019-02-19 DIAGNOSIS — E785 Hyperlipidemia, unspecified: Secondary | ICD-10-CM | POA: Diagnosis not present

## 2019-02-19 DIAGNOSIS — C61 Malignant neoplasm of prostate: Secondary | ICD-10-CM | POA: Diagnosis not present

## 2019-02-19 DIAGNOSIS — F4321 Adjustment disorder with depressed mood: Secondary | ICD-10-CM | POA: Diagnosis not present

## 2019-02-23 ENCOUNTER — Other Ambulatory Visit: Payer: Self-pay

## 2019-02-23 ENCOUNTER — Ambulatory Visit (HOSPITAL_COMMUNITY)
Admission: RE | Admit: 2019-02-23 | Discharge: 2019-02-23 | Disposition: A | Discharge status: Home / Self Care | Payer: PPO | Source: Ambulatory Visit | Attending: Gastroenterology | Admitting: Gastroenterology

## 2019-02-23 DIAGNOSIS — D500 Iron deficiency anemia secondary to blood loss (chronic): Secondary | ICD-10-CM | POA: Insufficient documentation

## 2019-02-23 MED ORDER — SODIUM CHLORIDE 0.9 % IV SOLN
510.0000 mg | INTRAVENOUS | Status: DC
  Administered 2019-02-23: 510 mg via INTRAVENOUS
  Filled 2019-02-23: qty 17

## 2019-03-09 DIAGNOSIS — E1165 Type 2 diabetes mellitus with hyperglycemia: Secondary | ICD-10-CM | POA: Diagnosis not present

## 2019-03-09 DIAGNOSIS — D500 Iron deficiency anemia secondary to blood loss (chronic): Secondary | ICD-10-CM | POA: Diagnosis not present

## 2019-03-09 DIAGNOSIS — Z794 Long term (current) use of insulin: Secondary | ICD-10-CM | POA: Diagnosis not present

## 2019-03-10 ENCOUNTER — Telehealth: Payer: Self-pay | Admitting: *Deleted

## 2019-03-10 DIAGNOSIS — D509 Iron deficiency anemia, unspecified: Secondary | ICD-10-CM | POA: Diagnosis not present

## 2019-03-10 DIAGNOSIS — E1165 Type 2 diabetes mellitus with hyperglycemia: Secondary | ICD-10-CM | POA: Diagnosis not present

## 2019-03-10 DIAGNOSIS — D500 Iron deficiency anemia secondary to blood loss (chronic): Secondary | ICD-10-CM | POA: Diagnosis not present

## 2019-03-10 DIAGNOSIS — E7800 Pure hypercholesterolemia, unspecified: Secondary | ICD-10-CM | POA: Diagnosis not present

## 2019-03-10 DIAGNOSIS — I10 Essential (primary) hypertension: Secondary | ICD-10-CM | POA: Diagnosis not present

## 2019-03-10 DIAGNOSIS — E785 Hyperlipidemia, unspecified: Secondary | ICD-10-CM | POA: Diagnosis not present

## 2019-03-10 DIAGNOSIS — F4321 Adjustment disorder with depressed mood: Secondary | ICD-10-CM | POA: Diagnosis not present

## 2019-03-10 DIAGNOSIS — I2510 Atherosclerotic heart disease of native coronary artery without angina pectoris: Secondary | ICD-10-CM | POA: Diagnosis not present

## 2019-03-10 DIAGNOSIS — I5030 Unspecified diastolic (congestive) heart failure: Secondary | ICD-10-CM | POA: Diagnosis not present

## 2019-03-10 DIAGNOSIS — N183 Chronic kidney disease, stage 3 (moderate): Secondary | ICD-10-CM | POA: Diagnosis not present

## 2019-03-10 DIAGNOSIS — Z8546 Personal history of malignant neoplasm of prostate: Secondary | ICD-10-CM | POA: Diagnosis not present

## 2019-03-10 DIAGNOSIS — C61 Malignant neoplasm of prostate: Secondary | ICD-10-CM | POA: Diagnosis not present

## 2019-03-10 NOTE — Telephone Encounter (Signed)
I called the patient to further discuss the indication for his planned procedure.  He states that he has dyspnea on exertion, weakness, poor activity tolerance and no energy.  He is wanting to have the procedure done as soon as possible.  It has been nearly a year since he was seen in the office by Dr. Tamala Julian on 04/02/2018.  The patient has an upcoming yearly follow-up scheduled on 04/06/2019 with Kathrynn Humble, NP.  I feel that it would be best if we could have the patient seen sooner for clearance.  I will route this message to the preop call back pool to see if we can get an appointment in the next week or so (hopefully with Dr. Tamala Julian or Cecille Rubin).

## 2019-03-10 NOTE — Telephone Encounter (Signed)
Pt contacted re: surgical clearance and complaint of new symptoms.  See phone note. Pt has been scheduled to see Truitt Merle, NIP, 03/11/2019 at 12:00. Pt aware to arrive 15 mins early for registration and to wear a mask.  Pt has answered NO to all covid19 prescreening questions below: Will route to requesting surgeon's office to make them aware.      COVID-19 Pre-Screening Questions:  . In the past 7 to 10 days have you had a cough,  shortness of breath, headache, congestion, fever (100 or greater) body aches, chills, sore throat, or sudden loss of taste or sense of smell? . Have you been around anyone with known Covid 19. . Have you been around anyone who is awaiting Covid 19 test results in the past 7 to 10 days? . Have you been around anyone who has been exposed to Covid 19, or has mentioned symptoms of Covid 19 within the past 7 to 10 days?  If you have any concerns/questions about symptoms patients report during screening (either on the phone or at threshold). Contact the provider seeing the patient or DOD for further guidance.  If neither are available contact a member of the leadership team.

## 2019-03-10 NOTE — Progress Notes (Signed)
CARDIOLOGY OFFICE NOTE  Date:  03/11/2019    Ryan Hunt Date of Birth: 23-Dec-1930 Medical Record #[id number]  PCP:  Seward Carol, MD  Cardiologist:  Jennings Books  Chief Complaint  Patient presents with  . Pre-op Exam    Seen for Dr. Tamala Julian    History of Present Illness: Ryan Hunt is a 85 y.o. male who presents today for a work in/surgical clearance visit. Seen for Dr. Tamala Julian.   He has a history of known CAD with prior LCX stent from 8937, chronic diastolic HF, chronic DOE, DM, HLD and HTN.   Last seen a little over a year ago - ACE had been stopped by Dr. Melvyn Novas due to the complaint of dyspnea. Switched over to ARB. BP not controlled. Norvasc was started. He had had recent cardiac cath for chest pain prior.   Now needing clearance for colonoscopy/EGD. Apparently has anemia and noted blood in stool.   The patient does not have symptoms concerning for COVID-19 infection (fever, chills, cough, or new shortness of breath).   Comes in today. Here alone. He needs clearance.  It is not scheduled. He has had 2 recent iron infusions. He has had labs earlier this week - I do not have those results. He is short of breath - says "been this way for a long time". He tells me his shortness of breath is unchanged from his last visit here a year ago. No chest pain noted. He could not walk around the block. He cannot go up stairs. He has trouble walking - has to use a walker in the mornings so he will not fall.  He does get out on his mower. Actually messed up the mowing deck and tried to fix it with a sledge hammer out in the heat. He watches TV and watches lots of You Tube. Not dizzy or lightheaded. No syncope. Has chronic edema and notes that currently his legs look better than usual. He is on high dose Norvasc. Overall, he feels like he is unchanged from last visit here.    Past Medical History:  Diagnosis Date  . Anemia   . Anginal pain ([place])   . Arthritis    "knees"  .  Bronchitis 11/2011   "first time for me"  . CAD (coronary artery disease)    With DES circumflex 02/2009  . CHF (congestive heart failure) ([place])   . Coronary atherosclerosis of native coronary artery   . 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 Diastolic heart failure ([place])   . Diverticulosis of colon (without mention of hemorrhage)   . Dyspnea   . Exertional dyspnea    Chronic  . GERD (gastroesophageal reflux disease)   . History of kidney stones   . History of peptic ulcer disease    Remote history  . Hypercholesterolemia   . Hypertension   . Kidney stone    "dr told me my kidney was loaded w/stones; imbedded"  . Labyrinthitis   . Leg edema   . Meralgia paresthetica   . Mixed hyperlipidemia   . Osteoarthrosis, unspecified whether generalized or localized, lower leg   . Palpitations   . Prostate cancer [facility])    s/p prostatectomy  . Type II diabetes mellitus (H[facility])    dx 3 yrs ago    Past Surgical History:  Procedure Laterality Date  . BACK SURGERY    . CARDIAC CATHETERIZATION    . CATARACT EXTRACTION W/ INTRAOCULAR LENS IMPLANT &  ANTERIOR VITRECTOMY, BILATERAL  1990's  . COLONOSCOPY    . CORONARY ANGIOPLASTY WITH STENT PLACEMENT  02/2009   Stent x 1 (DES circumflex)  . DIAGNOSTIC LAPAROSCOPY    . EYE SURGERY     bil lens implants  . JOINT REPLACEMENT     s/p left knee  . LASIK Bilateral   . LEFT HEART CATH AND CORONARY ANGIOGRAPHY N/A 02/27/2018   Procedure: LEFT HEART CATH AND CORONARY ANGIOGRAPHY;  Surgeon: Belva Crome, MD;  Location: [facility];  Service: Cardiovascular;  Laterality: N/A;  . LUMBAR LAMINECTOMY/DECOMPRESSION MICRODISCECTOMY N/A 12/23/2014   Procedure:  L2-3 DECOMPRESSION;  Surgeon: Melina Schools, MD;  Location: [place];  Service: Orthopedics;  Laterality: N/A;  . PROSTATECTOMY  2009   Robotic prostatectomy   . Skin Lumps Removed    . TOTAL KNEE ARTHROPLASTY  12/31/2011   Procedure: TOTAL KNEE ARTHROPLASTY;  Surgeon: Lorn Junes,  MD;  Location: [facility];  Service: Orthopedics;  Laterality: Left;  Left total knee arthroplasty  . TOTAL SHOULDER REPLACEMENT  1990's   bilaterally     Medications: Current Meds  Medication Sig  . amLODipine (NORVASC) 10 MG tablet TAKE 1 TABLET DAILY  . aspirin EC 81 MG tablet Take 1 tablet (81 mg total) by mouth daily.  Marland Kitchen CALCIUM PO Take 600 mg by mouth at bedtime.  . Cholecalciferol (VITAMIN D3 PO) Take 1 tablet by mouth as directed.  . Cyanocobalamin (B-12 PO) Take 1,000 mcg by mouth at bedtime.  . docusate calcium (SURFAK) 240 MG capsule Take 240 mg by mouth daily.   . ferrous sulfate 325 (65 FE) MG tablet Take 325 mg by mouth daily with breakfast.  . furosemide (LASIX) 40 MG tablet Take 40 mg by mouth 2 (two) times daily.   Marland Kitchen glimepiride (AMARYL) 2 MG tablet Take 2 mg by mouth 2 (two) times daily.  . isosorbide mononitrate (IMDUR) 30 MG 24 hr tablet Take 30 mg by mouth daily.  . metFORMIN (GLUCOPHAGE) 500 MG tablet Take 500 mg by mouth 2 (two) times daily with a meal.  . nitroGLYCERIN (NITROSTAT) 0.4 MG SL tablet Place 0.4 mg under the tongue every 5 (five) minutes as needed. For chest pain  . ONE TOUCH ULTRA TEST test strip   . pantoprazole (PROTONIX) 40 MG tablet Take 1 Tablet 30- 60 min before your first and last meals of the day  . rosuvastatin (CRESTOR) 10 MG tablet Take 10 mg by mouth daily.  Marland Kitchen telmisartan (MICARDIS) 80 MG tablet Take 1 tablet (80 mg total) by mouth daily.  . traMADol (ULTRAM) 50 MG tablet Take 50 mg by mouth every 12 (twelve) hours as needed for moderate pain.     Allergies: Allergies  Allergen Reactions  . Amoxicillin Hives, Other (See Comments) and Rash    "sores in my mouth" "sores in my mouth"  . Oxycodone Hcl     agitation  . Simvastatin     itching    Social History: The patient  reports that he quit smoking about 47 years ago. His smoking use included cigarettes. He has a 7.50 pack-year smoking history. He has never used smokeless tobacco.  He reports current alcohol use. He reports that he does not use drugs.   Family History: The patient's family history includes CAD in his father; COPD in his brother; CVA in his father and mother; Dementia in his mother; Heart disease in his father; Nephritis in his brother; Prostate cancer in his father; Stroke  in his father; Tremor in his mother.   Review of Systems: Please see the history of present illness.   All other systems are reviewed and negative.   Physical Exam: VS:  BP 130/66   Pulse (!) 55   Ht 5\' 8"  (1.727 m)   Wt 216 lb 12.8 oz (98.3 kg)   BMI 32.96 kg/m  .  BMI Body mass index is 32.96 kg/m.  Wt Readings from Last 3 Encounters:  03/11/19 216 lb 12.8 oz (98.3 kg)  02/23/19 212 lb (96.2 kg)  02/16/19 220 lb (99.8 kg)    General: Pleasant. Elderly. Alert and in no acute distress. He looks younger than his stated age.  He does get short of breath with conversation.  HEENT: Normal.  Neck: Supple, no JVD, carotid bruits, or masses noted.  Cardiac: Regular rate and rhythm. Soft outflow murmur noted. His legs are quite full - has 1 to 2+ edema bilaterally.  Respiratory:  Lungs are clear to auscultation bilaterally with normal work of breathing.  GI: Soft and nontender.  MS: No deformity or atrophy. Gait and ROM intact.  Skin: Warm and dry. Color is normal.  Neuro:  Strength and sensation are intact and no gross focal deficits noted.  Psych: Alert, appropriate and with normal affect.   LABORATORY DATA:  EKG:  EKG is ordered today. This demonstrates sinus bradycardia with 1st degree AV block - HR is 55 - has LBBB. It is unchanged.   Lab Results  Component Value Date   WBC 4.6 07/14/2018   HGB 11.5 (L) 07/14/2018   HCT 33.7 (L) 07/14/2018   PLT 114.0 (L) 07/14/2018   GLUCOSE 195 (H) 07/14/2018   CHOL 142 02/27/2018   TRIG 75 02/27/2018   HDL 44 02/27/2018   LDLCALC 83 02/27/2018   ALT 32 02/26/2018   AST 45 (H) 02/26/2018   NA 140 07/14/2018   K 4.1  07/14/2018   CL 106 07/14/2018   CREATININE 1.60 (H) 07/14/2018   BUN 28 (H) 07/14/2018   CO2 28 07/14/2018   TSH 2.00 05/08/2018   INR 1.16 12/24/2011   HGBA1C 8.4 (H) 02/26/2018       BNP (last 3 results) No results for input(s): BNP in the last 8760 hours.  ProBNP (last 3 results) Recent Labs    05/08/18 1727 07/14/18 1658  PROBNP 163.0* 147.0*     Other Studies Reviewed Today:  LEFT HEART CATH AND CORONARY ANGIOGRAPHY 02/2018  Conclusion    Dist LAD lesion is 50% stenosed.    Right dominant coronary anatomy with luminal irregularities in the proximal to mid segment.  Irregularities in the PDA and left ventricular branches.  Widely patent left main with distal 20% eccentric narrowing  LAD wraps around the left ventricular apex.  Proximal to mid vessel contains lucent 30 to 50% narrowing.  The first and second diagonal branches contain 75% ostial narrowing.  First septal perforator contains 90% ostial narrowing.  Circumflex is moderate in size and gives origin to a proximal to mid widely patent stent before 3 obtuse marginal branches arise.  The first obtuse marginal contains ostial 80% narrowing just distal to the stent margin.  The ostium of the circumflex contains 40 to 50% narrowing.  Overall normal LV function.  LVEDP upper normal at 17 mmHg.  EF estimated to be 55%.  RECOMMENDATIONS:   Symptoms of dyspnea are out of proportion to the degree of coronary disease.  The patient has 2 diagonals, a skeletal perforator, a  small first obtuse marginal all of which have significant ostial disease but due to relatively small size of best treated with medical therapy.  Doubt these vessels are the source of the patient's complaints.  Continue secondary risk factor modification.  Consider pulmonary consultation/other etiologies for dyspnea.  Recommend Aspirin 81mg  daily for moderate CAD.   Monitor Study Highlights 12/2016    NSR  Rate related BBB  SVT  salvos   NSR with rate related BBB and SVT salvos    Echo Study Conclusions 11/2016 - Left ventricle: The cavity size was normal. Wall thickness was   increased in a pattern of moderate LVH. Doppler parameters are   consistent with abnormal left ventricular relaxation (grade 1   diastolic dysfunction).   Assessment/Plan:  1. Surgical clearance - needing EGD/colonoscopy for anemia and heme + stool - ok to proceed - he will be at increased risk given his age and inability to do 4 mets - however he had cardiac cath just a year ago - stable findings - he is managed medically. His shortness of breath has been felt to be more pulmonary related. Do not think further testing at this time is warranted - nor will it reduce his risk. Will be available as needed. Ok to hold aspirin as needed.   2. CAD - cath from 2019 noted - no chest pain. Managed medically.  3. LBBB - chronic with 1st degree AV block - he is not having any dizzy or passing out spells.   4. HTN - BP is fine - on current regimen - I suspect his swelling is somewhat related to his high dose Norvasc.   5. HLD - on statin   6. Dyspnea- no longer on ACE by pulmonary - he notes his dyspnea is unchanged.   7. Iron deficiency anemia/heme + stool - for upcoming EGD and colonoscopy with GI.   8. COVID-19 Education: The signs and symptoms of COVID-19 were discussed with the patient and how to seek care for testing (follow up with PCP or arrange E-visit).  The importance of social distancing, staying at home, hand hygiene and wearing a mask when out in public were discussed today.  Current medicines are reviewed with the patient today.  The patient does not have concerns regarding medicines other than what has been noted above.  The following changes have been made:  See above.  Labs/ tests ordered today include:    Orders Placed This Encounter  Procedures  . EKG 12-Lead     Disposition:   FU with me in a few months per his  request.   Patient is agreeable to this plan and will call if any problems develop in the interim.   SignedTruitt Merle, NP  03/11/2019 12:49 PM  [facility] [address] [place], [place]  [address] Phone: [phone] Fax: [phone]

## 2019-03-10 NOTE — Telephone Encounter (Signed)
   [facility] Pre-operative Risk Assessment    Request for surgical clearance:  1. What type of surgery is being performed? COLONOSCOPY/ENDOSCOPY    2. When is this surgery scheduled? TBD    3. What type of clearance is required (medical clearance vs. Pharmacy clearance to hold med vs. Both)? MEDICAL  4. Are there any medications that need to be held prior to surgery and how long? ASA   5. Practice name and name of physician performing surgery? [facility]; DR. Oletta Lamas; PER SURGEON STATES IRON DEF ANEMIA/BLOOD IN STOOL, HGB 11.3 AS OF 03/09/19  6. What is your office phone number [phone]    7.   What is your office fax number [phone]  8.   Anesthesia type (None, local, MAC, general) ? PROPOFOL   Julaine Hua 03/10/2019, 9:24 AM  _________________________________________________________________   (provider comments below)

## 2019-03-11 ENCOUNTER — Ambulatory Visit (INDEPENDENT_AMBULATORY_CARE_PROVIDER_SITE_OTHER): Payer: PPO | Admitting: Nurse Practitioner

## 2019-03-11 ENCOUNTER — Other Ambulatory Visit: Payer: Self-pay

## 2019-03-11 ENCOUNTER — Encounter: Payer: Self-pay | Admitting: Nurse Practitioner

## 2019-03-11 ENCOUNTER — Ambulatory Visit: Payer: PPO | Admitting: Nurse Practitioner

## 2019-03-11 VITALS — BP 130/66 | HR 55 | Ht 68.0 in | Wt 216.8 lb

## 2019-03-11 DIAGNOSIS — E7800 Pure hypercholesterolemia, unspecified: Secondary | ICD-10-CM

## 2019-03-11 DIAGNOSIS — I447 Left bundle-branch block, unspecified: Secondary | ICD-10-CM

## 2019-03-11 DIAGNOSIS — I10 Essential (primary) hypertension: Secondary | ICD-10-CM

## 2019-03-11 DIAGNOSIS — R0609 Other forms of dyspnea: Secondary | ICD-10-CM | POA: Diagnosis not present

## 2019-03-11 DIAGNOSIS — Z01810 Encounter for preprocedural cardiovascular examination: Secondary | ICD-10-CM | POA: Diagnosis not present

## 2019-03-11 DIAGNOSIS — R0600 Dyspnea, unspecified: Secondary | ICD-10-CM

## 2019-03-11 DIAGNOSIS — I2510 Atherosclerotic heart disease of native coronary artery without angina pectoris: Secondary | ICD-10-CM | POA: Diagnosis not present

## 2019-03-11 DIAGNOSIS — Z7189 Other specified counseling: Secondary | ICD-10-CM

## 2019-03-11 NOTE — Patient Instructions (Addendum)
After Visit Summary:  We will be checking the following labs today - NONE   Medication Instructions:    Continue with your current medicines.    If you need a refill on your cardiac medications before your next appointment, please call your pharmacy.     Testing/Procedures To Be Arranged:  N/A  Follow-Up:   Do not need to see me later this month  See me in a couple of months    At [facility], you and your health needs are our priority.  As part of our continuing mission to provide you with exceptional heart care, we have created designated Provider Care Teams.  These Care Teams include your primary Cardiologist (physician) and Advanced Practice Providers (APPs -  Physician Assistants and Nurse Practitioners) who all work together to provide you with the care you need, when you need it.  Special Instructions:  . Stay safe, stay home, wash your hands for at least 20 seconds and wear a mask when out in public.  . It was good to talk with you today. . I will send a note to Dr. Oletta Lamas about your procedure.     Call the [place] office at [phone] if you have any questions, problems or concerns.

## 2019-03-13 ENCOUNTER — Other Ambulatory Visit: Payer: Self-pay | Admitting: Interventional Cardiology

## 2019-03-13 ENCOUNTER — Other Ambulatory Visit: Payer: Self-pay | Admitting: Gastroenterology

## 2019-04-06 ENCOUNTER — Ambulatory Visit: Payer: PPO | Admitting: Nurse Practitioner

## 2019-04-06 ENCOUNTER — Other Ambulatory Visit (HOSPITAL_COMMUNITY)
Admission: RE | Admit: 2019-04-06 | Discharge: 2019-04-06 | Disposition: A | Discharge status: Home / Self Care | Payer: PPO | Source: Ambulatory Visit | Attending: Gastroenterology | Admitting: Gastroenterology

## 2019-04-06 DIAGNOSIS — Z01812 Encounter for preprocedural laboratory examination: Secondary | ICD-10-CM | POA: Insufficient documentation

## 2019-04-06 DIAGNOSIS — Z20828 Contact with and (suspected) exposure to other viral communicable diseases: Secondary | ICD-10-CM | POA: Insufficient documentation

## 2019-04-06 NOTE — Progress Notes (Signed)
Left message for patient to call back regarding his colonoscopy and EGD with Dr Oletta Lamas.

## 2019-04-07 LAB — NOVEL CORONAVIRUS, NAA (HOSP ORDER, SEND-OUT TO REF LAB; TAT 18-24 HRS): SARS-CoV-2, NAA: NOT DETECTED

## 2019-04-07 NOTE — Progress Notes (Signed)
Pre-op endo call attempted again . No answer lmtcb.

## 2019-04-08 NOTE — Progress Notes (Signed)
Left message regarding endo appointment in am.

## 2019-04-09 ENCOUNTER — Ambulatory Visit (HOSPITAL_COMMUNITY)
Admission: RE | Admit: 2019-04-09 | Discharge: 2019-04-09 | Disposition: A | Discharge status: Home / Self Care | Payer: PPO | Attending: Gastroenterology | Admitting: Gastroenterology

## 2019-04-09 ENCOUNTER — Ambulatory Visit (HOSPITAL_COMMUNITY): Payer: PPO | Admitting: Registered Nurse

## 2019-04-09 ENCOUNTER — Other Ambulatory Visit: Payer: Self-pay

## 2019-04-09 ENCOUNTER — Encounter (HOSPITAL_COMMUNITY)
Admission: RE | Disposition: A | Discharge status: Home / Self Care | Payer: Self-pay | Source: Home / Self Care | Attending: Gastroenterology

## 2019-04-09 DIAGNOSIS — Z955 Presence of coronary angioplasty implant and graft: Secondary | ICD-10-CM | POA: Insufficient documentation

## 2019-04-09 DIAGNOSIS — R195 Other fecal abnormalities: Secondary | ICD-10-CM | POA: Insufficient documentation

## 2019-04-09 DIAGNOSIS — K219 Gastro-esophageal reflux disease without esophagitis: Secondary | ICD-10-CM | POA: Insufficient documentation

## 2019-04-09 DIAGNOSIS — N189 Chronic kidney disease, unspecified: Secondary | ICD-10-CM | POA: Insufficient documentation

## 2019-04-09 DIAGNOSIS — Z79899 Other long term (current) drug therapy: Secondary | ICD-10-CM | POA: Insufficient documentation

## 2019-04-09 DIAGNOSIS — D122 Benign neoplasm of ascending colon: Secondary | ICD-10-CM | POA: Diagnosis not present

## 2019-04-09 DIAGNOSIS — E782 Mixed hyperlipidemia: Secondary | ICD-10-CM | POA: Insufficient documentation

## 2019-04-09 DIAGNOSIS — Z96652 Presence of left artificial knee joint: Secondary | ICD-10-CM | POA: Diagnosis not present

## 2019-04-09 DIAGNOSIS — Z96611 Presence of right artificial shoulder joint: Secondary | ICD-10-CM | POA: Diagnosis not present

## 2019-04-09 DIAGNOSIS — D649 Anemia, unspecified: Secondary | ICD-10-CM | POA: Diagnosis not present

## 2019-04-09 DIAGNOSIS — I2510 Atherosclerotic heart disease of native coronary artery without angina pectoris: Secondary | ICD-10-CM | POA: Diagnosis not present

## 2019-04-09 DIAGNOSIS — D631 Anemia in chronic kidney disease: Secondary | ICD-10-CM | POA: Diagnosis not present

## 2019-04-09 DIAGNOSIS — Z8711 Personal history of peptic ulcer disease: Secondary | ICD-10-CM | POA: Insufficient documentation

## 2019-04-09 DIAGNOSIS — I5032 Chronic diastolic (congestive) heart failure: Secondary | ICD-10-CM | POA: Insufficient documentation

## 2019-04-09 DIAGNOSIS — D500 Iron deficiency anemia secondary to blood loss (chronic): Secondary | ICD-10-CM | POA: Insufficient documentation

## 2019-04-09 DIAGNOSIS — K317 Polyp of stomach and duodenum: Secondary | ICD-10-CM | POA: Insufficient documentation

## 2019-04-09 DIAGNOSIS — E7800 Pure hypercholesterolemia, unspecified: Secondary | ICD-10-CM | POA: Diagnosis not present

## 2019-04-09 DIAGNOSIS — Z87891 Personal history of nicotine dependence: Secondary | ICD-10-CM | POA: Insufficient documentation

## 2019-04-09 DIAGNOSIS — I130 Hypertensive heart and chronic kidney disease with heart failure and stage 1 through stage 4 chronic kidney disease, or unspecified chronic kidney disease: Secondary | ICD-10-CM | POA: Insufficient documentation

## 2019-04-09 DIAGNOSIS — Z6832 Body mass index (BMI) 32.0-32.9, adult: Secondary | ICD-10-CM | POA: Insufficient documentation

## 2019-04-09 DIAGNOSIS — Z8546 Personal history of malignant neoplasm of prostate: Secondary | ICD-10-CM | POA: Insufficient documentation

## 2019-04-09 DIAGNOSIS — Z7982 Long term (current) use of aspirin: Secondary | ICD-10-CM | POA: Insufficient documentation

## 2019-04-09 DIAGNOSIS — Q438 Other specified congenital malformations of intestine: Secondary | ICD-10-CM | POA: Diagnosis not present

## 2019-04-09 DIAGNOSIS — Z96612 Presence of left artificial shoulder joint: Secondary | ICD-10-CM | POA: Diagnosis not present

## 2019-04-09 DIAGNOSIS — E1122 Type 2 diabetes mellitus with diabetic chronic kidney disease: Secondary | ICD-10-CM | POA: Diagnosis not present

## 2019-04-09 DIAGNOSIS — D509 Iron deficiency anemia, unspecified: Secondary | ICD-10-CM | POA: Diagnosis not present

## 2019-04-09 DIAGNOSIS — K5730 Diverticulosis of large intestine without perforation or abscess without bleeding: Secondary | ICD-10-CM | POA: Diagnosis not present

## 2019-04-09 DIAGNOSIS — Z794 Long term (current) use of insulin: Secondary | ICD-10-CM | POA: Insufficient documentation

## 2019-04-09 DIAGNOSIS — K635 Polyp of colon: Secondary | ICD-10-CM | POA: Diagnosis not present

## 2019-04-09 HISTORY — PX: BIOPSY: SHX5522

## 2019-04-09 HISTORY — PX: POLYPECTOMY: SHX5525

## 2019-04-09 HISTORY — PX: ESOPHAGOGASTRODUODENOSCOPY (EGD) WITH PROPOFOL: SHX5813

## 2019-04-09 HISTORY — PX: COLONOSCOPY WITH PROPOFOL: SHX5780

## 2019-04-09 LAB — GLUCOSE, CAPILLARY: Glucose-Capillary: 127 mg/dL — ABNORMAL HIGH (ref 70–99)

## 2019-04-09 SURGERY — COLONOSCOPY WITH PROPOFOL
Anesthesia: Monitor Anesthesia Care

## 2019-04-09 MED ORDER — SODIUM CHLORIDE 0.9 % IV SOLN
INTRAVENOUS | Status: DC
  Administered 2019-04-09: 09:00:00 via INTRAVENOUS

## 2019-04-09 MED ORDER — PROPOFOL 10 MG/ML IV BOLUS
INTRAVENOUS | Status: AC
  Filled 2019-04-09: qty 60

## 2019-04-09 MED ORDER — EPHEDRINE SULFATE-NACL 50-0.9 MG/10ML-% IV SOSY
PREFILLED_SYRINGE | INTRAVENOUS | Status: DC | PRN
  Administered 2019-04-09: 10 mg via INTRAVENOUS

## 2019-04-09 MED ORDER — GLYCOPYRROLATE 0.2 MG/ML IJ SOLN
INTRAMUSCULAR | Status: DC | PRN
  Administered 2019-04-09: .2 mg via INTRAVENOUS

## 2019-04-09 MED ORDER — PHENYLEPHRINE HCL (PRESSORS) 10 MG/ML IV SOLN
INTRAVENOUS | Status: DC | PRN
  Administered 2019-04-09: 80 ug via INTRAVENOUS

## 2019-04-09 MED ORDER — PROPOFOL 500 MG/50ML IV EMUL
INTRAVENOUS | Status: DC | PRN
  Administered 2019-04-09: 200 ug/kg/min via INTRAVENOUS

## 2019-04-09 MED ORDER — SODIUM CHLORIDE 0.9 % IV SOLN: INTRAVENOUS | Status: DC

## 2019-04-09 MED ORDER — LIDOCAINE HCL (CARDIAC) PF 100 MG/5ML IV SOSY
PREFILLED_SYRINGE | INTRAVENOUS | Status: DC | PRN
  Administered 2019-04-09: 50 mg via INTRAVENOUS

## 2019-04-09 MED ORDER — PROPOFOL 10 MG/ML IV BOLUS
INTRAVENOUS | Status: AC
  Filled 2019-04-09: qty 20

## 2019-04-09 MED ORDER — LACTATED RINGERS IV SOLN
INTRAVENOUS | Status: DC
  Administered 2019-04-09: 1000 mL via INTRAVENOUS

## 2019-04-09 SURGICAL SUPPLY — 25 items

## 2019-04-09 NOTE — Op Note (Signed)
[facility] Patient Name: Ryan Hunt Procedure Date: 04/09/2019 MRN: [id number] Attending MD: Nancy Fetter Dr., MD Date of Birth: 1931-01-27 CSN: [id number] Age: 85 Admit Type: Outpatient Procedure:                Upper GI endoscopy Indications:              Iron deficiency anemia, Heme positive stool Providers:                Jeneen Rinks L. Gladiola Madore Dr., MD, Cleda Daub, RN,                            William Dalton, Technician Referring MD:              Medicines:                See the other procedure note for documentation of                            the administered medications Complications:            No immediate complications. Estimated Blood Loss:     Estimated blood loss: none. Procedure:                Pre-Anesthesia Assessment:                           - Prior to the procedure, a History and Physical                            was performed, and patient medications and                            allergies were reviewed. The patient's tolerance of                            previous anesthesia was also reviewed. The risks                            and benefits of the procedure and the sedation                            options and risks were discussed with the patient.                            All questions were answered, and informed consent                            was obtained. Prior Anticoagulants: The patient has                            taken no previous anticoagulant or antiplatelet                            agents. ASA Grade Assessment: III - A patient with  severe systemic disease. After reviewing the risks                            and benefits, the patient was deemed in                            satisfactory condition to undergo the procedure.                           After obtaining informed consent, the endoscope was                            passed under direct vision. Throughout the              procedure, the patient's blood pressure, pulse, and                            oxygen saturations were monitored continuously. The                            GIF-[id number] ([id number] Olympus gastroscope was                            introduced through the mouth, and advanced to the                            second part of duodenum. The upper GI endoscopy was                            accomplished without difficulty. The patient                            tolerated the procedure well. Scope In: Scope Out: Findings:      The esophagus was normal.      A single 15 mm pedunculated polyp with stigmata of recent bleeding was       found on the anterior wall of the stomach. Was severly ulcerated. The       polyp was removed with a hot snare. Resection and retrieval were       complete. The pathology specimen was placed into Bottle Number 1.      The examined duodenum was normal. Impression:               - Normal esophagus.                           - A single gastric polyp. Resected and retrieved.                           - Normal examined duodenum. Moderate Sedation:      MAC by anesthesia Recommendation:           - Patient has a contact number available for                            emergencies. The signs and symptoms of potential  delayed complications were discussed with the                            patient. Return to normal activities tomorrow.                            Written discharge instructions were provided to the                            patient.                           - Resume previous diet.                           - Continue present medications.                           - No aspirin, ibuprofen, naproxen, or other                            non-steroidal anti-inflammatory drugs for 5 days                            after polyp removal. Procedure Code(s):        --- Professional ---                           [phone],  Esophagogastroduodenoscopy, flexible,                            transoral; with removal of tumor(s), polyp(s), or                            other lesion(s) by snare technique Diagnosis Code(s):        --- Professional ---                           K31.7, Polyp of stomach and duodenum                           D50.9, Iron deficiency anemia, unspecified                           R19.5, Other fecal abnormalities CPT copyright 2019 American Medical Association. All rights reserved. The codes documented in this report are preliminary and upon coder review may  be revised to meet current compliance requirements. Nancy Fetter Dr., MD 04/09/2019 9:24:03 AM This report has been signed electronically. Number of Addenda: 0

## 2019-04-09 NOTE — Anesthesia Postprocedure Evaluation (Signed)
Anesthesia Post Note  Patient: Ryan Hunt  Procedure(s) Performed: COLONOSCOPY WITH PROPOFOL (N/A ) ESOPHAGOGASTRODUODENOSCOPY (EGD) WITH PROPOFOL (N/A ) POLYPECTOMY     Patient location during evaluation: PACU Anesthesia Type: MAC Level of consciousness: awake and alert Pain management: pain level controlled Vital Signs Assessment: post-procedure vital signs reviewed and stable Respiratory status: spontaneous breathing, nonlabored ventilation, respiratory function stable and patient connected to nasal cannula oxygen Cardiovascular status: stable and blood pressure returned to baseline Postop Assessment: no apparent nausea or vomiting Anesthetic complications: no    Last Vitals:  Vitals:   04/09/19 0955 04/09/19 1002  BP: (!) 107/39 128/60  Pulse: (!) 49 62  Resp: 18 13  Temp:    SpO2: 95% 96%    Last Pain:  Vitals:   04/09/19 1002  TempSrc:   PainSc: 0-No pain                 Gissell Barra COKER

## 2019-04-09 NOTE — Anesthesia Preprocedure Evaluation (Signed)
Anesthesia Evaluation  Patient identified by MRN, date of birth, ID band Patient awake    Reviewed: Allergy & Precautions, NPO status , Patient's Chart, lab work & pertinent test results  Airway Mallampati: II  TM Distance: >3 FB Neck ROM: Full    Dental  (+) Teeth Intact   Pulmonary former smoker,    breath sounds clear to auscultation       Cardiovascular hypertension,  Rhythm:Regular Rate:Normal     Neuro/Psych    GI/Hepatic   Endo/Other  diabetes  Renal/GU      Musculoskeletal   Abdominal   Peds  Hematology   Anesthesia Other Findings   Reproductive/Obstetrics                             Anesthesia Physical Anesthesia Plan  ASA: III  Anesthesia Plan: MAC   Post-op Pain Management:    Induction: Intravenous  PONV Risk Score and Plan: Propofol infusion and Ondansetron  Airway Management Planned: Natural Airway and Nasal Cannula  Additional Equipment:   Intra-op Plan:   Post-operative Plan:   Informed Consent: I have reviewed the patients History and Physical, chart, labs and discussed the procedure including the risks, benefits and alternatives for the proposed anesthesia with the patient or authorized representative who has indicated his/her understanding and acceptance.       Plan Discussed with: CRNA and Anesthesiologist  Anesthesia Plan Comments:         Anesthesia Quick Evaluation

## 2019-04-09 NOTE — Discharge Instructions (Signed)

## 2019-04-09 NOTE — Anesthesia Procedure Notes (Signed)
Procedure Name: MAC Date/Time: 04/09/2019 8:12 AM Performed by: Lissa Morales, CRNA Pre-anesthesia Checklist: Patient identified, Emergency Drugs available, Suction available, Patient being monitored and Timeout performed Patient Re-evaluated:Patient Re-evaluated prior to induction Oxygen Delivery Method: Nasal cannula Preoxygenation: Pre-oxygenation with 100% oxygen Placement Confirmation: positive ETCO2

## 2019-04-09 NOTE — Transfer of Care (Signed)
Immediate Anesthesia Transfer of Care Note  Patient: Ryan Hunt  Procedure(s) Performed: COLONOSCOPY WITH PROPOFOL (N/A ) ESOPHAGOGASTRODUODENOSCOPY (EGD) WITH PROPOFOL (N/A ) POLYPECTOMY BIOPSY  Patient Location: PACU  Anesthesia Type:MAC  Level of Consciousness: drowsy and patient cooperative  Airway & Oxygen Therapy: Patient Spontanous Breathing and Patient connected to nasal cannula oxygen  Post-op Assessment: Report given to RN and Post -op Vital signs reviewed and stable  Post vital signs: stable  Last Vitals:  Vitals Value Taken Time  BP 127/44 04/09/19 0930  Temp    Pulse 56 04/09/19 0931  Resp 23 04/09/19 0931  SpO2 96 % 04/09/19 0931  Vitals shown include unvalidated device data.  Last Pain:  Vitals:   04/09/19 0928  TempSrc:   PainSc: 0-No pain         Complications: No apparent anesthesia complications

## 2019-04-09 NOTE — Op Note (Signed)
[facility] Patient Name: Ryan Hunt Procedure Date: 04/09/2019 MRN: [id number] Attending MD: Nancy Fetter Dr., MD Date of Birth: [birth year] CSN: [id number] Age: 85 Admit Type: Outpatient Procedure:                Colonoscopy Indications:              Heme positive stool, Iron deficiency anemia Providers:                Jeneen Rinks L. Pedro Oldenburg Dr., MD, Cleda Daub, RN,                            William Dalton, Technician Referring MD:              Medicines:                Monitored Anesthesia Care Complications:            No immediate complications. Estimated Blood Loss:     Estimated blood loss: none. Procedure:                Pre-Anesthesia Assessment:                           - Prior to the procedure, a History and Physical                            was performed, and patient medications and                            allergies were reviewed. The patient's tolerance of                            previous anesthesia was also reviewed. The risks                            and benefits of the procedure and the sedation                            options and risks were discussed with the patient.                            All questions were answered, and informed consent                            was obtained. Prior Anticoagulants: The patient has                            taken no previous anticoagulant or antiplatelet                            agents. ASA Grade Assessment: III - A patient with                            severe systemic disease. After reviewing the risks  and benefits, the patient was deemed in                            satisfactory condition to undergo the procedure.                           After obtaining informed consent, the colonoscope                            was passed under direct vision. Throughout the                            procedure, the patient's blood pressure, pulse, and                             oxygen saturations were monitored continuously. The                            PCF-H190DL ([id number]) Olympus pediatric colonscope                            was introduced through the anus and advanced to the                            the cecum, identified by appendiceal orifice and                            ileocecal valve. The colonoscopy was extremely                            difficult due to inadequate bowel prep, a redundant                            colon, significant looping and a tortuous colon.                            Successful completion of the procedure was aided by                            changing the patient to a supine position,                            withdrawing the scope and replacing with the adult                            colonoscope and applying abdominal pressure. The                            patient tolerated the procedure fairly well. The                            ileocecal valve and the appendiceal orifice were  photographed. Scope In: 8:33:36 AM Scope Out: 8:36:62 AM Scope Withdrawal Time: 0 hours 3 minutes 7 seconds  Total Procedure Duration: 0 hours 42 minutes 42 seconds  Findings:      The perianal and digital rectal examinations were normal.      Multiple medium-mouthed diverticula were found in the sigmoid colon and       descending colon.      Two sessile polyps were found in the ascending colon. The polyps were       medium in size. These were biopsied with a cold forceps for histology.       The colon was extremely dirty some parts of the colon could not be seen.       We were unable to pass the hepatic flexure with a pediatric colonoscope       and switched to the adult scope. Then with multiple maneuvers, abdominal       pressure etc. were able to suck her way down to the cecum. There were       large quantities of stool throughout the right colon. There were 2 flat       polyps seen that were biopsied.  Due to the poor prep we cannot be sure       there were not a great deal more polyps. Multiple small diminutive type       polyps were seen in the left colon upon removal. These were not       biopsied. The pathology specimen was placed into Bottle Number 2. Impression:               - Diverticulosis in the sigmoid colon and in the                            descending colon.                           - Two medium polyps in the ascending colon.                            Biopsied. These were not attempted to be removed.                            The prep was so poor could not be sure there were                            not multiple other polyps present.                           - Inadequate prep Moderate Sedation:      See anesthesia note, no moderate sedation. Recommendation:           - Patient has a contact number available for                            emergencies. The signs and symptoms of potential                            delayed complications were discussed with the  patient. Return to normal activities tomorrow.                            Written discharge instructions were provided to the                            patient.                           - Resume previous diet.                           - Continue present medications.                           - No aspirin, ibuprofen, naproxen, or other                            non-steroidal anti-inflammatory drugs for 5 days                            after polyp removal.                           - Await pathology results.                           - Repeat colonoscopy. We will need a much better                            prep and possibly longer scope in order to reach                            the ascending colon in a stable manner. Procedure Code(s):        --- Professional ---                           [phone], Colonoscopy, flexible; with biopsy, single                            or  multiple Diagnosis Code(s):        --- Professional ---                           K63.5, Polyp of colon                           R19.5, Other fecal abnormalities                           D50.9, Iron deficiency anemia, unspecified                           K57.30, Diverticulosis of large intestine without                            perforation or abscess without bleeding  CPT copyright 2019 [employer]. All rights reserved. The codes documented in this report are preliminary and upon coder review may  be revised to meet current compliance requirements. Nancy Fetter Dr., MD 04/09/2019 9:31:51 AM This report has been signed electronically. Number of Addenda: 0

## 2019-04-09 NOTE — H&P (Signed)
Subjective:   Patient is a 85 y.o. male presents with anemia and positive stool.  He has not had colonoscopy since 2007 this was done by Dr. Timmothy Euler and showed only diverticulosis.  He had not been taking any NSAIDs and his hemoglobin had been slowly coming back up with iron replacement.  He is due for EGD and colonoscopy to try to determine the source of his blood loss.. Procedure including risks and benefits discussed in office.  Patient Active Problem List   Diagnosis Date Noted  . Upper airway cough syndrome 07/15/2018  . Morbid obesity due to excess calories complicated by DM/hbp(HCC) 03/25/2018  . Unstable angina ([place]) 02/26/2018  . Chest pain with high risk for cardiac etiology   . Chronic diastolic CHF (congestive heart failure) ([place])   . LBBB (left bundle branch block) 11/24/2016  . Bradycardia 11/24/2016  . Synovial cyst of lumbar facet joint 12/23/2014  . CAD (coronary artery disease), native coronary artery 08/20/2013  . Postoperative anemia due to acute blood loss 01/02/2012  . Diabetes mellitus type 2 in obese ([place]) 12/17/2011  . Essential hypertension   . Bronchitis   . Dyspnea on exertion   . Arthritis   . Hypercholesterolemia   . Left knee DJD    Past Medical History:  Diagnosis Date  . Anemia   . Anginal pain ([place])   . Arthritis    "knees"  . Bronchitis 11/2011   "first time for me"  . CAD (coronary artery disease)    With DES circumflex 02/2009  . CHF (congestive heart failure) ([place])   . Coronary atherosclerosis of native coronary artery   . Diabetes mellitus with kidney disease ([place])   . Diastolic heart failure ([place])   . Diverticulosis of colon (without mention of hemorrhage)   . Dyspnea   . Exertional dyspnea    Chronic  . GERD (gastroesophageal reflux disease)   . History of kidney stones   . History of peptic ulcer disease    Remote history  . Hypercholesterolemia   . Hypertension   . Kidney stone    "dr told me my kidney was loaded w/stones;  imbedded"  . Labyrinthitis   . Leg edema   . Meralgia paresthetica   . Mixed hyperlipidemia   . Osteoarthrosis, unspecified whether generalized or localized, lower leg   . Palpitations   . Prostate cancer [facility])    s/p prostatectomy  . Type II diabetes mellitus (HCC)    dx 3 yrs ago    Past Surgical History:  Procedure Laterality Date  . BACK SURGERY    . CARDIAC CATHETERIZATION    . CATARACT EXTRACTION W/ INTRAOCULAR LENS IMPLANT & ANTERIOR VITRECTOMY, BILATERAL  1990's  . COLONOSCOPY    . CORONARY ANGIOPLASTY WITH STENT PLACEMENT  02/2009   Stent x 1 (DES circumflex)  . DIAGNOSTIC LAPAROSCOPY    . EYE SURGERY     bil lens implants  . JOINT REPLACEMENT     s/p left knee  . LASIK Bilateral   . LEFT HEART CATH AND CORONARY ANGIOGRAPHY N/A 02/27/2018   Procedure: LEFT HEART CATH AND CORONARY ANGIOGRAPHY;  Surgeon: Belva Crome, MD;  Location: [place] [facility];  Service: Cardiovascular;  Laterality: N/A;  . LUMBAR LAMINECTOMY/DECOMPRESSION MICRODISCECTOMY N/A 12/23/2014   Procedure:  L2-3 DECOMPRESSION;  Surgeon: Melina Schools, MD;  Location: [place];  Service: Orthopedics;  Laterality: N/A;  . PROSTATECTOMY  2009   Robotic prostatectomy   . Skin Lumps Removed    .  TOTAL KNEE ARTHROPLASTY  12/31/2011   Procedure: TOTAL KNEE ARTHROPLASTY;  Surgeon: Lorn Junes, MD;  Location: [facility];  Service: Orthopedics;  Laterality: Left;  Left total knee arthroplasty  . TOTAL SHOULDER REPLACEMENT  1990's   bilaterally    Medications Prior to Admission  Medication Sig Dispense Refill Last Dose  . amLODipine (NORVASC) 10 MG tablet TAKE 1 TABLET DAILY (Patient taking differently: Take 10 mg by mouth every evening. ) 90 tablet 3 04/09/2019 at Unknown time  . aspirin EC 81 MG tablet Take 1 tablet (81 mg total) by mouth daily. (Patient taking differently: Take 81 mg by mouth every evening. ) 90 tablet 3 Past Week at Unknown time  . calcium carbonate (OSCAL) 1500 (600 Ca) MG TABS tablet Take 600 mg  by mouth at bedtime.   Past Week at Unknown time  . docusate sodium (COLACE) 250 MG capsule Take 250 mg by mouth at bedtime.   Past Week at Unknown time  . ferrous sulfate 325 (65 FE) MG tablet Take 325 mg by mouth daily with breakfast.   Past Week at Unknown time  . furosemide (LASIX) 40 MG tablet Take 40 mg by mouth 2 (two) times daily.    04/08/2019 at Unknown time  . glimepiride (AMARYL) 2 MG tablet Take 2-4 mg by mouth See admin instructions. Take 2 tablets (4 mg) by mouth in the morning & 1 tablet (2 mg) by mouth at night.   Past Week at Unknown time  . isosorbide mononitrate (IMDUR) 30 MG 24 hr tablet Take 30 mg by mouth daily.   04/08/2019 at Unknown time  . LANTUS SOLOSTAR 100 UNIT/ML Solostar Pen Inject 18 Units into the skin daily.   04/09/2019 at Unknown time  . metFORMIN (GLUCOPHAGE) 500 MG tablet Take 500 mg by mouth 2 (two) times daily.    Past Week at Unknown time  . nitroGLYCERIN (NITROSTAT) 0.4 MG SL tablet Place 0.4 mg under the tongue every 5 (five) minutes as needed for chest pain.      . pantoprazole (PROTONIX) 40 MG tablet Take 1 Tablet 30- 60 min before your first and last meals of the day (Patient taking differently: Take 40 mg by mouth 2 (two) times daily. Take 1 Tablet 30- 60 min before your first and last meals of the day) 60 tablet 11 04/08/2019 at Unknown time  . rosuvastatin (CRESTOR) 10 MG tablet Take 10 mg by mouth daily.   04/09/2019 at Unknown time  . telmisartan (MICARDIS) 80 MG tablet Take 1 tablet (80 mg total) by mouth daily. 30 tablet 2 04/09/2019 at Unknown time  . traMADol (ULTRAM) 50 MG tablet Take 50 mg by mouth 2 (two) times daily as needed for moderate pain (arthritis back pain.).    04/08/2019 at Unknown time  . vitamin B-12 (CYANOCOBALAMIN) 1000 MCG tablet Take 1,000 mcg by mouth at bedtime.   04/08/2019 at Unknown time  . Vitamin D3 (VITAMIN D) 25 MCG tablet Take 1,000 Units by mouth daily.   04/08/2019 at Unknown time  . ONE TOUCH ULTRA TEST test strip      .  polyethylene glycol-electrolytes (NULYTELY/GOLYTELY) 420 g solution Take 4,000 mLs by mouth once.      Allergies  Allergen Reactions  . Amoxicillin Hives, Rash and Other (See Comments)    "sores in my mouth" Did it involve swelling of the face/tongue/throat, SOB, or low BP? Yes Did it involve sudden or severe rash/hives, skin peeling, or any reaction on the inside  of your mouth or nose? Yes Did you need to seek medical attention at a hospital or doctor's office? Unknown When did it last happen?at least 10 years ago If all above answers are "NO", may proceed with cephalosporin use.   Marland Kitchen Oxycodone Hcl     agitation  . Simvastatin Itching    Social History   Tobacco Use  . Smoking status: Former Smoker    Packs/day: 0.50    Years: 15.00    Pack years: 7.50    Types: Cigarettes    Quit date: 07/10/1971    Years since quitting: 47.7  . Smokeless tobacco: Never Used  Substance Use Topics  . Alcohol use: Yes    Comment: 01/01/12 "occassionall have a glass of beer or wine; not even once a week"    Family History  Problem Relation Age of Onset  . CVA Father   . CAD Father   . Prostate cancer Father   . Heart disease Father        ASHD  . Stroke Father   . Nephritis Brother   . COPD Brother   . CVA Mother   . Tremor Mother   . Dementia Mother   . Heart attack Neg Hx      Objective:   No data found. No intake/output data recorded. No intake/output data recorded.   See MD Preop evaluation      Assessment:   1.  Iron deficiency anemia/guaiac positive stool 2.  GERD 3.  Type 2 diabetes 4.  CKD 5.  History of prostate cancer  Plan:   We will proceed with EGD and colonoscopy.  Have discussed this in detail with the patient in the office.

## 2019-04-10 ENCOUNTER — Encounter (HOSPITAL_COMMUNITY): Payer: Self-pay | Admitting: Gastroenterology

## 2019-04-10 ENCOUNTER — Other Ambulatory Visit: Payer: Self-pay

## 2019-04-10 LAB — SURGICAL PATHOLOGY

## 2019-04-22 DIAGNOSIS — I2510 Atherosclerotic heart disease of native coronary artery without angina pectoris: Secondary | ICD-10-CM | POA: Diagnosis not present

## 2019-04-22 DIAGNOSIS — F4321 Adjustment disorder with depressed mood: Secondary | ICD-10-CM | POA: Diagnosis not present

## 2019-04-22 DIAGNOSIS — E785 Hyperlipidemia, unspecified: Secondary | ICD-10-CM | POA: Diagnosis not present

## 2019-04-22 DIAGNOSIS — D500 Iron deficiency anemia secondary to blood loss (chronic): Secondary | ICD-10-CM | POA: Diagnosis not present

## 2019-04-22 DIAGNOSIS — E1165 Type 2 diabetes mellitus with hyperglycemia: Secondary | ICD-10-CM | POA: Diagnosis not present

## 2019-04-22 DIAGNOSIS — I10 Essential (primary) hypertension: Secondary | ICD-10-CM | POA: Diagnosis not present

## 2019-04-22 DIAGNOSIS — N1831 Chronic kidney disease, stage 3a: Secondary | ICD-10-CM | POA: Diagnosis not present

## 2019-04-22 DIAGNOSIS — Z8546 Personal history of malignant neoplasm of prostate: Secondary | ICD-10-CM | POA: Diagnosis not present

## 2019-04-22 DIAGNOSIS — C61 Malignant neoplasm of prostate: Secondary | ICD-10-CM | POA: Diagnosis not present

## 2019-04-22 DIAGNOSIS — D509 Iron deficiency anemia, unspecified: Secondary | ICD-10-CM | POA: Diagnosis not present

## 2019-04-22 DIAGNOSIS — E7800 Pure hypercholesterolemia, unspecified: Secondary | ICD-10-CM | POA: Diagnosis not present

## 2019-04-22 DIAGNOSIS — I5030 Unspecified diastolic (congestive) heart failure: Secondary | ICD-10-CM | POA: Diagnosis not present

## 2019-05-21 ENCOUNTER — Ambulatory Visit: Payer: PPO | Admitting: Interventional Cardiology

## 2019-05-22 DIAGNOSIS — D126 Benign neoplasm of colon, unspecified: Secondary | ICD-10-CM | POA: Diagnosis not present

## 2019-05-22 DIAGNOSIS — D500 Iron deficiency anemia secondary to blood loss (chronic): Secondary | ICD-10-CM | POA: Diagnosis not present

## 2019-05-22 DIAGNOSIS — K219 Gastro-esophageal reflux disease without esophagitis: Secondary | ICD-10-CM | POA: Diagnosis not present

## 2019-05-22 DIAGNOSIS — C61 Malignant neoplasm of prostate: Secondary | ICD-10-CM | POA: Diagnosis not present

## 2019-05-22 DIAGNOSIS — E1165 Type 2 diabetes mellitus with hyperglycemia: Secondary | ICD-10-CM | POA: Diagnosis not present

## 2019-05-22 DIAGNOSIS — K317 Polyp of stomach and duodenum: Secondary | ICD-10-CM | POA: Diagnosis not present

## 2019-06-11 NOTE — Progress Notes (Signed)
CARDIOLOGY OFFICE NOTE  Date:  06/17/2019    Ryan Hunt Date of Birth: 1931-01-16 Medical Record #[id number]  PCP:  Seward Carol, MD  Cardiologist:  Jennings Books    Chief Complaint  Patient presents with  . Follow-up    Seen for Dr. Tamala Julian    History of Present Illness: Ryan Hunt is a 85 y.o. male who presents today for a 3 month check.  Seen for Dr. Tamala Julian.   He has a history of known CAD with prior LCX stent from 5732, chronic diastolic HF, chronic DOE, DM, HLD and HTN.   When seen in 2019 - ACE had been stopped by Dr. Melvyn Novas due to the complaint of dyspnea. Switched over to ARB. BP not controlled. Norvasc was started. He had had cardiac cath for chest pain prior with stable findings - to manage medically. His shortness of breath has been felt to be more pulmonary from our standpoint.   I last saw him back in September - he was needing clearance for colonoscopy/EGD - had had noted anemia and blood in the stool. He has been started on iron infusions. He had stable chronic shortness of breath. Endorsed more trouble with walking. He felt like overall he was stable.   The patient does not have symptoms concerning for COVID-19 infection (fever, chills, cough, or new shortness of breath).   Comes in today. Here alone. Feels like he is doing ok. He tells me he was noted to have some polyps and what sounded like an ulcer. There was concern that the prep was not adequate and sounds like he may have to have a repeat study early next year. This has not been scheduled and now he is going to have a different doctor - Dr. Oletta Lamas is retiring. No chest pain. Breathing is the same. Hs weight is up a few pounds - he feels this is due to the pandemic. He is trying to watch his salt. He is managing his swelling with his support stockings. Overall, he feels like he is doing ok.    Past Medical History:  Diagnosis Date  . Anemia   . Anginal pain ([place])   . Arthritis    "knees"  .  Bronchitis 11/2011   "first time for me"  . CAD (coronary artery disease)    With DES circumflex 02/2009  . CHF (congestive heart failure) ([place])   . Coronary atherosclerosis of native coronary artery   . 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 Diastolic heart failure ([place])   . Diverticulosis of colon (without mention of hemorrhage)   . Dyspnea   . Exertional dyspnea    Chronic  . GERD (gastroesophageal reflux disease)   . History of kidney stones   . History of peptic ulcer disease    Remote history  . Hypercholesterolemia   . Hypertension   . Kidney stone    "dr told me my kidney was loaded w/stones; imbedded"  . Labyrinthitis   . Leg edema   . Meralgia paresthetica   . Mixed hyperlipidemia   . Osteoarthrosis, unspecified whether generalized or localized, lower leg   . Palpitations   . Prostate cancer [facility])    s/p prostatectomy  . Type II diabetes mellitus (HCC)    dx 3 yrs ago    Past Surgical History:  Procedure Laterality Date  . BACK SURGERY    . BIOPSY  04/09/2019   Procedure: BIOPSY;  Surgeon: Laurence Spates, MD;  Location: [facility];  Service: Endoscopy;;  . CARDIAC CATHETERIZATION    . CATARACT EXTRACTION W/ INTRAOCULAR LENS IMPLANT & ANTERIOR VITRECTOMY, BILATERAL  1990's  . COLONOSCOPY    . COLONOSCOPY WITH PROPOFOL N/A 04/09/2019   Procedure: COLONOSCOPY WITH PROPOFOL;  Surgeon: Laurence Spates, MD;  Location: [facility];  Service: Endoscopy;  Laterality: N/A;  . CORONARY ANGIOPLASTY WITH STENT PLACEMENT  02/2009   Stent x 1 (DES circumflex)  . DIAGNOSTIC LAPAROSCOPY    . ESOPHAGOGASTRODUODENOSCOPY (EGD) WITH PROPOFOL N/A 04/09/2019   Procedure: ESOPHAGOGASTRODUODENOSCOPY (EGD) WITH PROPOFOL;  Surgeon: Laurence Spates, MD;  Location: [facility];  Service: Endoscopy;  Laterality: N/A;  . EYE SURGERY     bil lens implants  . JOINT REPLACEMENT     s/p left knee  . LASIK Bilateral   . LEFT HEART CATH AND CORONARY ANGIOGRAPHY N/A 02/27/2018    Procedure: LEFT HEART CATH AND CORONARY ANGIOGRAPHY;  Surgeon: Belva Crome, MD;  Location: [facility];  Service: Cardiovascular;  Laterality: N/A;  . LUMBAR LAMINECTOMY/DECOMPRESSION MICRODISCECTOMY N/A 12/23/2014   Procedure:  L2-3 DECOMPRESSION;  Surgeon: Melina Schools, MD;  Location: [facility];  Service: Orthopedics;  Laterality: N/A;  . POLYPECTOMY  04/09/2019   Procedure: POLYPECTOMY;  Surgeon: Laurence Spates, MD;  Location: [facility];  Service: Endoscopy;;  . PROSTATECTOMY  2009   Robotic prostatectomy   . Skin Lumps Removed    . TOTAL KNEE ARTHROPLASTY  12/31/2011   Procedure: TOTAL KNEE ARTHROPLASTY;  Surgeon: Lorn Junes, MD;  Location: [facility];  Service: Orthopedics;  Laterality: Left;  Left total knee arthroplasty  . TOTAL SHOULDER REPLACEMENT  1990's   bilaterally     Medications: Current Meds  Medication Sig  . amLODipine (NORVASC) 10 MG tablet TAKE 1 TABLET DAILY (Patient taking differently: Take 10 mg by mouth every evening. )  . aspirin EC 81 MG tablet Take 1 tablet (81 mg total) by mouth daily. (Patient taking differently: Take 81 mg by mouth every evening. )  . calcium carbonate (OSCAL) 1500 (600 Ca) MG TABS tablet Take 600 mg by mouth at bedtime.  . docusate sodium (COLACE) 250 MG capsule Take 250 mg by mouth at bedtime.  . ferrous sulfate 325 (65 FE) MG tablet Take 325 mg by mouth daily with breakfast.  . furosemide (LASIX) 40 MG tablet Take 40 mg by mouth 2 (two) times daily.   Marland Kitchen glimepiride (AMARYL) 2 MG tablet Take 2-4 mg by mouth See admin instructions. Take 2 tablets (4 mg) by mouth in the morning & 1 tablet (2 mg) by mouth at night.  . isosorbide mononitrate (IMDUR) 30 MG 24 hr tablet Take 30 mg by mouth daily.  Marland Kitchen LANTUS SOLOSTAR 100 UNIT/ML Solostar Pen Inject 18 Units into the skin daily.  . metFORMIN (GLUCOPHAGE) 500 MG tablet Take 500 mg by mouth 2 (two) times daily.   . nitroGLYCERIN (NITROSTAT) 0.4 MG SL tablet Place 0.4 mg under the tongue every 5  (five) minutes as needed for chest pain.   . ONE TOUCH ULTRA TEST test strip   . pantoprazole (PROTONIX) 40 MG tablet Take 1 Tablet 30- 60 min before your first and last meals of the day (Patient taking differently: Take 40 mg by mouth 2 (two) times daily. Take 1 Tablet 30- 60 min before your first and last meals of the day)  . rosuvastatin (CRESTOR) 10 MG tablet Take 10 mg by mouth daily.  Marland Kitchen telmisartan (MICARDIS) 80 MG tablet Take 1 tablet (  80 mg total) by mouth daily.  . traMADol (ULTRAM) 50 MG tablet Take 50 mg by mouth 2 (two) times daily as needed for moderate pain (arthritis back pain.).   Marland Kitchen vitamin B-12 (CYANOCOBALAMIN) 1000 MCG tablet Take 1,000 mcg by mouth at bedtime.  . Vitamin D3 (VITAMIN D) 25 MCG tablet Take 1,000 Units by mouth daily.  . [DISCONTINUED] polyethylene glycol-electrolytes (NULYTELY/GOLYTELY) 420 g solution Take 4,000 mLs by mouth once.     Allergies: Allergies  Allergen Reactions  . Amoxicillin Hives, Rash and Other (See Comments)    "sores in my mouth" Did it involve swelling of the face/tongue/throat, SOB, or low BP? Yes Did it involve sudden or severe rash/hives, skin peeling, or any reaction on the inside of your mouth or nose? Yes Did you need to seek medical attention at a hospital or doctor's office? Unknown When did it last happen?at least 10 years ago If all above answers are "NO", may proceed with cephalosporin use.   Marland Kitchen Oxycodone Hcl     agitation  . Simvastatin Itching    Social History: The patient  reports that he quit smoking about 47 years ago. His smoking use included cigarettes. He has a 7.50 pack-year smoking history. He has never used smokeless tobacco. He reports current alcohol use. He reports that he does not use drugs.   Family History: The patient's family history includes CAD in his father; COPD in his brother; CVA in his father and mother; Dementia in his mother; Heart disease in his father; Nephritis in his brother; Prostate  cancer in his father; Stroke in his father; Tremor in his mother.   Review of Systems: Please see the history of present illness.   All other systems are reviewed and negative.   Physical Exam: VS:  BP 140/72   Pulse 63   Ht 5\' 8"  (1.727 m)   Wt 221 lb (100.2 kg)   SpO2 96%   BMI 33.60 kg/m  .  BMI Body mass index is 33.6 kg/m.  Wt Readings from Last 3 Encounters:  06/17/19 221 lb (100.2 kg)  04/09/19 212 lb (96.2 kg)  03/11/19 216 lb 12.8 oz (98.3 kg)    General: Pleasant. Elderly. Alert and in no acute distress.   HEENT: Normal.  Neck: Supple, no JVD, carotid bruits, or masses noted.  Cardiac: Regular rate and rhythm. Outflow murmur. No edema.  Respiratory:  Lungs are clear to auscultation bilaterally with normal work of breathing.  GI: Soft and nontender.  MS: No deformity or atrophy. Gait and ROM intact.  Skin: Warm and dry. Color is normal.  Neuro:  Strength and sensation are intact and no gross focal deficits noted.  Psych: Alert, appropriate and with normal affect.   LABORATORY DATA:  EKG:  EKG is not ordered today.  Lab Results  Component Value Date   WBC 4.6 07/14/2018   HGB 11.5 (L) 07/14/2018   HCT 33.7 (L) 07/14/2018   PLT 114.0 (L) 07/14/2018   GLUCOSE 195 (H) 07/14/2018   CHOL 142 02/27/2018   TRIG 75 02/27/2018   HDL 44 02/27/2018   LDLCALC 83 02/27/2018   ALT 32 02/26/2018   AST 45 (H) 02/26/2018   NA 140 07/14/2018   K 4.1 07/14/2018   CL 106 07/14/2018   CREATININE 1.60 (H) 07/14/2018   BUN 28 (H) 07/14/2018   CO2 28 07/14/2018   TSH 2.00 05/08/2018   INR 1.16 12/24/2011   HGBA1C 8.4 (H) 02/26/2018  BNP (last 3 results) No results for input(s): BNP in the last 8760 hours.  ProBNP (last 3 results) Recent Labs    07/14/18 1658  PROBNP 147.0*     Other Studies Reviewed Today:  LEFT HEART CATH AND CORONARY ANGIOGRAPHY 02/2018  Conclusion    Dist LAD lesion is 50% stenosed.   Right dominant coronary anatomy with  luminal irregularities in the proximal to mid segment. Irregularities in the PDA and left ventricular branches.  Widely patent left main with distal 20% eccentric narrowing  LAD wraps around the left ventricular apex. Proximal to mid vessel contains lucent 30 to 50% narrowing. The first and second diagonal branches contain 75% ostial narrowing. First septal perforator contains 90% ostial narrowing.  Circumflex is moderate in size and gives origin to a proximal to mid widely patent stent before 3 obtuse marginal branches arise. The first obtuse marginal contains ostial 80% narrowing just distal to the stent margin. The ostium of the circumflex contains 40 to 50% narrowing.  Overall normal LV function. LVEDP upper normal at 17 mmHg. EF estimated to be 55%.  RECOMMENDATIONS:   Symptoms of dyspnea are out of proportion to the degree of coronary disease. The patient has 2 diagonals, a skeletal perforator, a small first obtuse marginal all of which have significant ostial disease but due to relatively small size of best treated with medical therapy. Doubt these vessels are the source of the patient's complaints.  Continue secondary risk factor modification.  Consider pulmonary consultation/other etiologies for dyspnea.  Recommend Aspirin 81mg  daily for moderate CAD.   Monitor Study Highlights 12/2016    NSR  Rate related BBB  SVT salvos  NSR with rate related BBB and SVT salvos    Echo Study Conclusions 11/2016 - Left ventricle: The cavity size was normal. Wall thickness was increased in a pattern of moderate LVH. Doppler parameters are consistent with abnormal left ventricular relaxation (grade 1 diastolic dysfunction).   Assessment/Plan:  1. Anemia with prior heme + stool - sounds like he had an ulcer and polyps. Prep may not have been sufficient and may be having repeat study sometime in early 2021. Would be ok by [place] - no active cardiac symptoms. He  is still not able to do 4 mets and has multiple co-morbidities. Cath stable last year. He is managed medically. [facility] lab today.   2. CAD - cath from 2019 noted - he has no active symptoms. Remains on long acting nitrate.   3. LBBB - chronic - no dizzy spells.   4. HTN - BP is fair. No changes made.   5. Lower extremity edema - probably related to CCB therapy - he is now using support stockings with good results. Salt restriction encouraged.   6. HLD - on statin.   7. Dyspnea - unchanged and stable.   8. COVID-19 Education: The signs and symptoms of COVID-19 were discussed with the patient and how to seek care for testing (follow up with PCP or arrange E-visit).  The importance of social distancing, staying at home, hand hygiene and wearing a mask when out in public were discussed today.  Current medicines are reviewed with the patient today.  The patient does not have concerns regarding medicines other than what has been noted above.  The following changes have been made:  See above.  Labs/ tests ordered today include:    Orders Placed This Encounter  Procedures  . Basic metabolic panel  . CBC no Diff  Disposition:   FU with Dr. Tamala Julian in about 4 months.  I am happy to see back as needed.   Patient is agreeable to this plan and will call if any problems develop in the interim.   SignedTruitt Merle, NP  06/17/2019 11:45 AM  [facility] [address] [place], [place]  [address] Phone: [phone] Fax: [phone]

## 2019-06-17 ENCOUNTER — Other Ambulatory Visit: Payer: Self-pay

## 2019-06-17 ENCOUNTER — Encounter: Payer: Self-pay | Admitting: Nurse Practitioner

## 2019-06-17 ENCOUNTER — Ambulatory Visit (INDEPENDENT_AMBULATORY_CARE_PROVIDER_SITE_OTHER): Payer: PPO | Admitting: Nurse Practitioner

## 2019-06-17 VITALS — BP 140/72 | HR 63 | Ht 68.0 in | Wt 221.0 lb

## 2019-06-17 DIAGNOSIS — I447 Left bundle-branch block, unspecified: Secondary | ICD-10-CM | POA: Diagnosis not present

## 2019-06-17 DIAGNOSIS — E7800 Pure hypercholesterolemia, unspecified: Secondary | ICD-10-CM

## 2019-06-17 DIAGNOSIS — Z7189 Other specified counseling: Secondary | ICD-10-CM | POA: Diagnosis not present

## 2019-06-17 DIAGNOSIS — R0600 Dyspnea, unspecified: Secondary | ICD-10-CM | POA: Diagnosis not present

## 2019-06-17 DIAGNOSIS — I10 Essential (primary) hypertension: Secondary | ICD-10-CM | POA: Diagnosis not present

## 2019-06-17 DIAGNOSIS — I2510 Atherosclerotic heart disease of native coronary artery without angina pectoris: Secondary | ICD-10-CM

## 2019-06-17 DIAGNOSIS — R0609 Other forms of dyspnea: Secondary | ICD-10-CM

## 2019-06-17 LAB — BASIC METABOLIC PANEL
BUN/Creatinine Ratio: 16 (ref 10–24)
BUN: 27 mg/dL (ref 8–27)
CO2: 22 mmol/L (ref 20–29)
Calcium: 9.8 mg/dL (ref 8.6–10.2)
Chloride: 104 mmol/L (ref 96–106)
Creatinine, Ser: 1.74 mg/dL — ABNORMAL HIGH (ref 0.76–1.27)
GFR calc Af Amer: 40 mL/min/{1.73_m2} — ABNORMAL LOW (ref >59)
GFR calc non Af Amer: 35 mL/min/{1.73_m2} — ABNORMAL LOW (ref >59)
Glucose: 104 mg/dL — ABNORMAL HIGH (ref 65–99)
Potassium: 4.8 mmol/L (ref 3.5–5.2)
Sodium: 142 mmol/L (ref 134–144)

## 2019-06-17 LAB — CBC
Hematocrit: 35.5 % — ABNORMAL LOW (ref 37.5–51.0)
Hemoglobin: 12.3 g/dL — ABNORMAL LOW (ref 13.0–17.7)
MCH: 32.3 pg (ref 26.6–33.0)
MCHC: 34.6 g/dL (ref 31.5–35.7)
MCV: 93 fL (ref 79–97)
Platelets: 111 10*3/uL — ABNORMAL LOW (ref 150–450)
RBC: 3.81 x10E6/uL — ABNORMAL LOW (ref 4.14–5.80)
RDW: 13.6 % (ref 11.6–15.4)
WBC: 5.6 10*3/uL (ref 3.4–10.8)

## 2019-06-17 NOTE — Patient Instructions (Addendum)
After Visit Summary:  We will be checking the following labs today - BMET & CBC   Medication Instructions:    Continue with your current medicines.    If you need a refill on your cardiac medications before your next appointment, please call your pharmacy.     Testing/Procedures To Be Arranged:  N/A  Follow-Up:   See Dr. Tamala Julian in about 4 months - You will receive a reminder letter in the mail two months in advance. If you don't receive a letter, please call our office to schedule the follow-up appointment.     At [facility], you and your health needs are our priority.  As part of our continuing mission to provide you with exceptional heart care, we have created designated Provider Care Teams.  These Care Teams include your primary Cardiologist (physician) and Advanced Practice Providers (APPs -  Physician Assistants and Nurse Practitioners) who all work together to provide you with the care you need, when you need it.  Special Instructions:  . Stay safe, stay home, wash your hands for at least 20 seconds and wear a mask when out in public.  . It was good to talk with you today.    Call the [place] office at [phone] if you have any questions, problems or concerns.

## 2019-07-29 DIAGNOSIS — E1122 Type 2 diabetes mellitus with diabetic chronic kidney disease: Secondary | ICD-10-CM | POA: Diagnosis not present

## 2019-07-29 DIAGNOSIS — I5030 Unspecified diastolic (congestive) heart failure: Secondary | ICD-10-CM | POA: Diagnosis not present

## 2019-07-29 DIAGNOSIS — I10 Essential (primary) hypertension: Secondary | ICD-10-CM | POA: Diagnosis not present

## 2019-07-29 DIAGNOSIS — D126 Benign neoplasm of colon, unspecified: Secondary | ICD-10-CM | POA: Diagnosis not present

## 2019-07-29 DIAGNOSIS — I2510 Atherosclerotic heart disease of native coronary artery without angina pectoris: Secondary | ICD-10-CM | POA: Diagnosis not present

## 2019-07-29 DIAGNOSIS — N1832 Chronic kidney disease, stage 3b: Secondary | ICD-10-CM | POA: Diagnosis not present

## 2019-07-29 DIAGNOSIS — K317 Polyp of stomach and duodenum: Secondary | ICD-10-CM | POA: Diagnosis not present

## 2019-07-29 DIAGNOSIS — D500 Iron deficiency anemia secondary to blood loss (chronic): Secondary | ICD-10-CM | POA: Diagnosis not present

## 2019-07-29 DIAGNOSIS — E7800 Pure hypercholesterolemia, unspecified: Secondary | ICD-10-CM | POA: Diagnosis not present

## 2019-08-07 DIAGNOSIS — N1831 Chronic kidney disease, stage 3a: Secondary | ICD-10-CM | POA: Diagnosis not present

## 2019-08-07 DIAGNOSIS — F4321 Adjustment disorder with depressed mood: Secondary | ICD-10-CM | POA: Diagnosis not present

## 2019-08-07 DIAGNOSIS — E785 Hyperlipidemia, unspecified: Secondary | ICD-10-CM | POA: Diagnosis not present

## 2019-08-07 DIAGNOSIS — Z8546 Personal history of malignant neoplasm of prostate: Secondary | ICD-10-CM | POA: Diagnosis not present

## 2019-08-07 DIAGNOSIS — D500 Iron deficiency anemia secondary to blood loss (chronic): Secondary | ICD-10-CM | POA: Diagnosis not present

## 2019-08-07 DIAGNOSIS — E1122 Type 2 diabetes mellitus with diabetic chronic kidney disease: Secondary | ICD-10-CM | POA: Diagnosis not present

## 2019-08-07 DIAGNOSIS — I10 Essential (primary) hypertension: Secondary | ICD-10-CM | POA: Diagnosis not present

## 2019-08-07 DIAGNOSIS — I5030 Unspecified diastolic (congestive) heart failure: Secondary | ICD-10-CM | POA: Diagnosis not present

## 2019-08-07 DIAGNOSIS — C61 Malignant neoplasm of prostate: Secondary | ICD-10-CM | POA: Diagnosis not present

## 2019-08-07 DIAGNOSIS — I2510 Atherosclerotic heart disease of native coronary artery without angina pectoris: Secondary | ICD-10-CM | POA: Diagnosis not present

## 2019-08-07 DIAGNOSIS — E1165 Type 2 diabetes mellitus with hyperglycemia: Secondary | ICD-10-CM | POA: Diagnosis not present

## 2019-08-13 ENCOUNTER — Other Ambulatory Visit: Payer: Self-pay | Admitting: Gastroenterology

## 2019-08-13 DIAGNOSIS — Z86010 Personal history of colonic polyps: Secondary | ICD-10-CM | POA: Diagnosis not present

## 2019-08-13 DIAGNOSIS — Z862 Personal history of diseases of the blood and blood-forming organs and certain disorders involving the immune mechanism: Secondary | ICD-10-CM | POA: Diagnosis not present

## 2019-08-13 DIAGNOSIS — Z8679 Personal history of other diseases of the circulatory system: Secondary | ICD-10-CM | POA: Diagnosis not present

## 2019-08-26 DIAGNOSIS — E785 Hyperlipidemia, unspecified: Secondary | ICD-10-CM | POA: Diagnosis not present

## 2019-08-26 DIAGNOSIS — Z8546 Personal history of malignant neoplasm of prostate: Secondary | ICD-10-CM | POA: Diagnosis not present

## 2019-08-26 DIAGNOSIS — E1122 Type 2 diabetes mellitus with diabetic chronic kidney disease: Secondary | ICD-10-CM | POA: Diagnosis not present

## 2019-08-26 DIAGNOSIS — C61 Malignant neoplasm of prostate: Secondary | ICD-10-CM | POA: Diagnosis not present

## 2019-08-26 DIAGNOSIS — N183 Chronic kidney disease, stage 3 unspecified: Secondary | ICD-10-CM | POA: Diagnosis not present

## 2019-08-26 DIAGNOSIS — D500 Iron deficiency anemia secondary to blood loss (chronic): Secondary | ICD-10-CM | POA: Diagnosis not present

## 2019-08-26 DIAGNOSIS — I5030 Unspecified diastolic (congestive) heart failure: Secondary | ICD-10-CM | POA: Diagnosis not present

## 2019-08-26 DIAGNOSIS — I10 Essential (primary) hypertension: Secondary | ICD-10-CM | POA: Diagnosis not present

## 2019-08-26 DIAGNOSIS — E1165 Type 2 diabetes mellitus with hyperglycemia: Secondary | ICD-10-CM | POA: Diagnosis not present

## 2019-08-26 DIAGNOSIS — F4321 Adjustment disorder with depressed mood: Secondary | ICD-10-CM | POA: Diagnosis not present

## 2019-08-26 DIAGNOSIS — E7800 Pure hypercholesterolemia, unspecified: Secondary | ICD-10-CM | POA: Diagnosis not present

## 2019-08-26 DIAGNOSIS — I2510 Atherosclerotic heart disease of native coronary artery without angina pectoris: Secondary | ICD-10-CM | POA: Diagnosis not present

## 2019-08-27 ENCOUNTER — Other Ambulatory Visit: Payer: Self-pay | Admitting: Internal Medicine

## 2019-08-28 ENCOUNTER — Other Ambulatory Visit (HOSPITAL_COMMUNITY)
Admission: RE | Admit: 2019-08-28 | Discharge: 2019-08-28 | Disposition: A | Discharge status: Home / Self Care | Payer: PPO | Source: Ambulatory Visit | Attending: Gastroenterology | Admitting: Gastroenterology

## 2019-08-28 DIAGNOSIS — Z01812 Encounter for preprocedural laboratory examination: Secondary | ICD-10-CM | POA: Insufficient documentation

## 2019-08-28 DIAGNOSIS — Z20822 Contact with and (suspected) exposure to covid-19: Secondary | ICD-10-CM | POA: Diagnosis not present

## 2019-08-28 LAB — SARS CORONAVIRUS 2 (TAT 6-24 HRS): SARS Coronavirus 2: NEGATIVE

## 2019-08-29 NOTE — Anesthesia Preprocedure Evaluation (Addendum)
Anesthesia Evaluation  Patient identified by MRN, date of birth, ID band Patient awake    Reviewed: Allergy & Precautions, NPO status , Patient's Chart, lab work & pertinent test results  Airway Mallampati: II  TM Distance: >3 FB     Dental   Pulmonary shortness of breath, former smoker,    breath sounds clear to auscultation       Cardiovascular hypertension, + angina + CAD and +CHF  + dysrhythmias  Rhythm:Regular Rate:Normal     Neuro/Psych  Neuromuscular disease    GI/Hepatic Neg liver ROS, GERD  ,  Endo/Other  diabetes  Renal/GU Renal disease     Musculoskeletal  (+) Arthritis ,   Abdominal   Peds  Hematology  (+) anemia ,   Anesthesia Other Findings   Reproductive/Obstetrics                           Anesthesia Physical Anesthesia Plan  ASA: III  Anesthesia Plan: MAC   Post-op Pain Management:    Induction: Intravenous  PONV Risk Score and Plan: 2 and Propofol infusion and Ondansetron  Airway Management Planned: Nasal Cannula and Simple Face Mask  Additional Equipment:   Intra-op Plan:   Post-operative Plan:   Informed Consent: I have reviewed the patients History and Physical, chart, labs and discussed the procedure including the risks, benefits and alternatives for the proposed anesthesia with the patient or authorized representative who has indicated his/her understanding and acceptance.     Dental advisory given  Plan Discussed with: Anesthesiologist and CRNA  Anesthesia Plan Comments:      Anesthesia Quick Evaluation

## 2019-08-31 ENCOUNTER — Encounter (HOSPITAL_COMMUNITY)
Admission: RE | Disposition: A | Discharge status: Home / Self Care | Payer: Self-pay | Source: Home / Self Care | Attending: Gastroenterology

## 2019-08-31 ENCOUNTER — Ambulatory Visit (HOSPITAL_COMMUNITY)
Admission: RE | Admit: 2019-08-31 | Discharge: 2019-08-31 | Disposition: A | Discharge status: Home / Self Care | Payer: PPO | Attending: Gastroenterology | Admitting: Gastroenterology

## 2019-08-31 ENCOUNTER — Other Ambulatory Visit: Payer: Self-pay

## 2019-08-31 ENCOUNTER — Ambulatory Visit (HOSPITAL_COMMUNITY): Payer: PPO | Admitting: Anesthesiology

## 2019-08-31 ENCOUNTER — Encounter (HOSPITAL_COMMUNITY): Payer: Self-pay | Admitting: Gastroenterology

## 2019-08-31 DIAGNOSIS — D123 Benign neoplasm of transverse colon: Secondary | ICD-10-CM | POA: Insufficient documentation

## 2019-08-31 DIAGNOSIS — D125 Benign neoplasm of sigmoid colon: Secondary | ICD-10-CM | POA: Diagnosis not present

## 2019-08-31 DIAGNOSIS — Z09 Encounter for follow-up examination after completed treatment for conditions other than malignant neoplasm: Secondary | ICD-10-CM | POA: Diagnosis present

## 2019-08-31 DIAGNOSIS — I110 Hypertensive heart disease with heart failure: Secondary | ICD-10-CM | POA: Diagnosis not present

## 2019-08-31 DIAGNOSIS — D120 Benign neoplasm of cecum: Secondary | ICD-10-CM | POA: Insufficient documentation

## 2019-08-31 DIAGNOSIS — D122 Benign neoplasm of ascending colon: Secondary | ICD-10-CM | POA: Insufficient documentation

## 2019-08-31 DIAGNOSIS — I509 Heart failure, unspecified: Secondary | ICD-10-CM | POA: Insufficient documentation

## 2019-08-31 DIAGNOSIS — Z87891 Personal history of nicotine dependence: Secondary | ICD-10-CM | POA: Diagnosis not present

## 2019-08-31 DIAGNOSIS — I2510 Atherosclerotic heart disease of native coronary artery without angina pectoris: Secondary | ICD-10-CM | POA: Diagnosis not present

## 2019-08-31 DIAGNOSIS — K648 Other hemorrhoids: Secondary | ICD-10-CM | POA: Insufficient documentation

## 2019-08-31 DIAGNOSIS — Z86010 Personal history of colonic polyps: Secondary | ICD-10-CM | POA: Insufficient documentation

## 2019-08-31 DIAGNOSIS — E119 Type 2 diabetes mellitus without complications: Secondary | ICD-10-CM | POA: Diagnosis not present

## 2019-08-31 DIAGNOSIS — Q438 Other specified congenital malformations of intestine: Secondary | ICD-10-CM | POA: Insufficient documentation

## 2019-08-31 DIAGNOSIS — D124 Benign neoplasm of descending colon: Secondary | ICD-10-CM | POA: Insufficient documentation

## 2019-08-31 DIAGNOSIS — K635 Polyp of colon: Secondary | ICD-10-CM | POA: Diagnosis not present

## 2019-08-31 DIAGNOSIS — D649 Anemia, unspecified: Secondary | ICD-10-CM | POA: Insufficient documentation

## 2019-08-31 HISTORY — PX: HEMOSTASIS CLIP PLACEMENT: SHX6857

## 2019-08-31 HISTORY — PX: POLYPECTOMY: SHX5525

## 2019-08-31 HISTORY — PX: COLONOSCOPY WITH PROPOFOL: SHX5780

## 2019-08-31 LAB — POCT I-STAT, CHEM 8
BUN: 31 mg/dL — ABNORMAL HIGH (ref 8–23)
Calcium, Ion: 1.26 mmol/L (ref 1.15–1.40)
Chloride: 112 mmol/L — ABNORMAL HIGH (ref 98–111)
Creatinine, Ser: 2 mg/dL — ABNORMAL HIGH (ref 0.61–1.24)
Glucose, Bld: 193 mg/dL — ABNORMAL HIGH (ref 70–99)
HCT: 38 % — ABNORMAL LOW (ref 39.0–52.0)
Hemoglobin: 12.9 g/dL — ABNORMAL LOW (ref 13.0–17.0)
Potassium: 4.5 mmol/L (ref 3.5–5.1)
Sodium: 143 mmol/L (ref 135–145)
TCO2: 24 mmol/L (ref 22–32)

## 2019-08-31 LAB — GLUCOSE, CAPILLARY: Glucose-Capillary: 175 mg/dL — ABNORMAL HIGH (ref 70–99)

## 2019-08-31 SURGERY — COLONOSCOPY WITH PROPOFOL
Anesthesia: Monitor Anesthesia Care

## 2019-08-31 MED ORDER — PROPOFOL 500 MG/50ML IV EMUL
INTRAVENOUS | Status: DC | PRN
  Administered 2019-08-31: 75 ug/kg/min via INTRAVENOUS

## 2019-08-31 MED ORDER — PROPOFOL 10 MG/ML IV BOLUS
INTRAVENOUS | Status: AC
  Filled 2019-08-31: qty 20

## 2019-08-31 MED ORDER — FENTANYL CITRATE (PF) 100 MCG/2ML IJ SOLN: 25.0000 ug | INTRAMUSCULAR | Status: DC | PRN

## 2019-08-31 MED ORDER — LACTATED RINGERS IV SOLN
INTRAVENOUS | Status: DC
  Administered 2019-08-31: 1000 mL via INTRAVENOUS

## 2019-08-31 MED ORDER — PHENYLEPHRINE 40 MCG/ML (10ML) SYRINGE FOR IV PUSH (FOR BLOOD PRESSURE SUPPORT)
PREFILLED_SYRINGE | INTRAVENOUS | Status: AC
  Filled 2019-08-31: qty 10

## 2019-08-31 MED ORDER — PROPOFOL 500 MG/50ML IV EMUL
INTRAVENOUS | Status: AC
  Filled 2019-08-31: qty 50

## 2019-08-31 MED ORDER — PROPOFOL 10 MG/ML IV BOLUS
INTRAVENOUS | Status: DC | PRN
  Administered 2019-08-31 (×3): 20 mg via INTRAVENOUS

## 2019-08-31 MED ORDER — ONDANSETRON HCL 4 MG/2ML IJ SOLN
INTRAMUSCULAR | Status: DC | PRN
  Administered 2019-08-31: 4 mg via INTRAVENOUS

## 2019-08-31 MED ORDER — SODIUM CHLORIDE 0.9 % IV SOLN: INTRAVENOUS | Status: DC

## 2019-08-31 SURGICAL SUPPLY — 22 items

## 2019-08-31 NOTE — Transfer of Care (Signed)
Immediate Anesthesia Transfer of Care Note  Patient: Ryan Hunt  Procedure(s) Performed: COLONOSCOPY WITH PROPOFOL (N/A ) POLYPECTOMY HEMOSTASIS CLIP PLACEMENT  Patient Location: PACU and Endoscopy Unit  Anesthesia Type:MAC  Level of Consciousness: awake, sedated, patient cooperative and responds to stimulation  Airway & Oxygen Therapy: Patient Spontanous Breathing and Patient connected to face mask oxygen  Post-op Assessment: Report given to RN and Post -op Vital signs reviewed and stable  Post vital signs: Reviewed and stable  Last Vitals:  Vitals Value Taken Time  BP    Temp    Pulse 44 08/31/19 0954  Resp 17 08/31/19 0954  SpO2 100 % 08/31/19 0954  Vitals shown include unvalidated device data.  Last Pain:  Vitals:   08/31/19 0753  TempSrc: Oral  PainSc: 0-No pain         Complications: No apparent anesthesia complications

## 2019-08-31 NOTE — Discharge Instructions (Signed)

## 2019-08-31 NOTE — Op Note (Signed)
[facility] Patient Name: Ryan Hunt Procedure Date: 08/31/2019 MRN: [id number] Attending MD: Otis Brace , MD Date of Birth: 03/30/1931 CSN: [id number] Age: 86 Admit Type: Outpatient Procedure:                Colonoscopy Indications:              Last colonoscopy: October 2020 - inadequate prep,                            Adenomatous polyps in the colon Providers:                Otis Brace, MD, Truddie Coco, RN, Janeece Agee,                            Technician, [place] Zenia Resides CRNA, CRNA Referring MD:              Medicines:                 Complications:            No immediate complications. Estimated Blood Loss:     Estimated blood loss was minimal. Procedure:                Pre-Anesthesia Assessment:                           - Prior to the procedure, a History and Physical                            was performed, and patient medications and                            allergies were reviewed. The patient's tolerance of                            previous anesthesia was also reviewed. The risks                            and benefits of the procedure and the sedation                            options and risks were discussed with the patient.                            All questions were answered, and informed consent                            was obtained. Prior Anticoagulants: The patient has                            taken no previous anticoagulant or antiplatelet                            agents except for aspirin. ASA Grade Assessment:  III - A patient with severe systemic disease. After                            reviewing the risks and benefits, the patient was                            deemed in satisfactory condition to undergo the                            procedure.                           After obtaining informed consent, the colonoscope                            was passed under direct vision. Throughout  the                            procedure, the patient's blood pressure, pulse, and                            oxygen saturations were monitored continuously. The                            PCF-H190DL ([id number] Olympus pediatric colonscope                            was introduced through the anus and advanced to the                            the cecum, identified by appendiceal orifice and                            ileocecal valve. The colonoscopy was performed with                            moderate difficulty due to a redundant colon,                            significant looping and a tortuous colon.                            Successful completion of the procedure was aided by                            changing the patient to a supine position and                            applying abdominal pressure. Scope In: 8:41:03 AM Scope Out: 9:46:20 AM Scope Withdrawal Time: 0 hours 44 minutes 43 seconds  Total Procedure Duration: 1 hour 5 minutes 17 seconds  Findings:      The perianal and digital rectal examinations were normal.      The colon (entire examined portion) was moderately redundant.  The sigmoid colon revealed moderately excessive looping. Advancing the       scope required changing the patient to a supine position and applying       abdominal pressure.      A 10 mm polyp was found in the cecum. The polyp was sessile. The polyp       was removed with a piecemeal technique using a hot snare. Polyp       resection was incomplete. The resected tissue was retrieved.      Four multi-lobulated and sessile polyps were found in the ascending       colon. The polyps were 10 to 20 mm in size. These polyps were removed       with a piecemeal technique using a hot snare. Resection was complete,       and retrieval was complete. To close a defect after polypectomy, two       hemostatic clips were successfully placed on large polypectomy site.       There was no bleeding at the end  of the procedure. Polyps were retrived       with Jabier Mutton net. Scope was then reinserted.      Three sessile polyps were found in the transverse colon. The polyps were       7 to 12 mm in size. These polyps were removed with a hot snare.       Resection and retrieval were complete.      Five sessile polyps were found in the descending colon. The polyps were       5 to 10 mm in size. These polyps were removed with a hot snare.       Resection and retrieval were complete.      A 7 mm polyp was found in the sigmoid colon. The polyp was       semi-pedunculated. The polyp was removed with a hot snare. Resection and       retrieval were complete.      Multiple diverticula were found in the sigmoid colon and descending       colon.      Internal hemorrhoids were found during retroflexion. The hemorrhoids       were medium-sized. Impression:               - Redundant colon.                           - There was significant looping of the colon.                           - One 10 mm polyp in the cecum, removed piecemeal                            using a hot snare. Incomplete resection. Resected                            tissue retrieved.                           - Four 10 to 20 mm polyps in the ascending colon,  removed piecemeal using a hot snare. Resected and                            retrieved. Clips were placed.                           - Three 7 to 12 mm polyps in the transverse colon,                            removed with a hot snare. Resected and retrieved.                           - Five 5 to 10 mm polyps in the descending colon,                            removed with a hot snare. Resected and retrieved.                           - One 7 mm polyp in the sigmoid colon, removed with                            a hot snare. Resected and retrieved.                           - Diverticulosis in the sigmoid colon and in the                            descending  colon.                           - Internal hemorrhoids. Moderate Sedation:      Moderate (conscious) sedation was personally administered by an       anesthesia professional. The following parameters were monitored: oxygen       saturation, heart rate, blood pressure, and response to care. Recommendation:           - Patient has a contact number available for                            emergencies. The signs and symptoms of potential                            delayed complications were discussed with the                            patient. Return to normal activities tomorrow.                            Written discharge instructions were provided to the                            patient.                           - Resume  previous diet.                           - Continue present medications.                           - Await pathology results.                           - Repeat colonoscopy date to be determined after                            pending pathology results are reviewed for                            surveillance after piecemeal polypectomy and for                            surveillance based on pathology results.                           - Return to my office in 6 months. Procedure Code(s):        --- Professional ---                           [phone], Colonoscopy, flexible; with removal of                            tumor(s), polyp(s), or other lesion(s) by snare                            technique Diagnosis Code(s):        --- Professional ---                           K64.8, Other hemorrhoids                           K63.5, Polyp of colon                           D12.6, Benign neoplasm of colon, unspecified                           K57.30, Diverticulosis of large intestine without                            perforation or abscess without bleeding                           Q43.8, Other specified congenital malformations of                             intestine CPT copyright 2019 American Medical Association. All rights reserved. The codes documented in this report are preliminary and upon coder review may  be revised to meet current compliance requirements. Otis Brace, MD Otis Brace, MD 08/31/2019 10:03:54  AM Number of Addenda: 0

## 2019-08-31 NOTE — H&P (Signed)
ONIS MARKOFF is a 86 y.o. male has presented today for colonoscopy.  Patient with personal history of adenomatous polyps.  Last colonoscopy showed poor prep. The various methods of treatment have been discussed with the patient and family. After consideration of risks, benefits and other options for treatment, the patient has consented to  Procedure(s): Colonoscopy as a surgical intervention .  The patient's history has been reviewed, patient examined, no change in status, stable for surgery.  I have reviewed the patient's chart and labs.  Questions were answered to the patient's satisfaction.    Risks (bleeding, infection, bowel perforation that could require surgery, sedation-related changes in cardiopulmonary systems), benefits (identification and possible treatment of source of symptoms, exclusion of certain causes of symptoms), and alternatives (watchful waiting, radiographic imaging studies, empiric medical treatment)  were explained to patient/family in detail and patient wishes to proceed.  Otis Brace MD, [place] 08/31/2019, 8:30 AM  Contact #  [phone]

## 2019-08-31 NOTE — Anesthesia Procedure Notes (Signed)
Procedure Name: MAC Date/Time: 08/31/2019 8:36 AM Performed by: Lollie Sails, CRNA Pre-anesthesia Checklist: Patient identified, Emergency Drugs available, Suction available, Patient being monitored and Timeout performed Oxygen Delivery Method: Simple face mask

## 2019-08-31 NOTE — Anesthesia Postprocedure Evaluation (Signed)
Anesthesia Post Note  Patient: Ryan Hunt  Procedure(s) Performed: COLONOSCOPY WITH PROPOFOL (N/A ) POLYPECTOMY HEMOSTASIS CLIP PLACEMENT     Patient location during evaluation: Endoscopy Anesthesia Type: MAC Level of consciousness: awake Pain management: pain level controlled Vital Signs Assessment: post-procedure vital signs reviewed and stable Respiratory status: spontaneous breathing Cardiovascular status: stable Postop Assessment: no apparent nausea or vomiting Anesthetic complications: no    Last Vitals:  Vitals:   08/31/19 1021 08/31/19 1024  BP:  (!) 117/40  Pulse: (!) 55 (!) 57  Resp: 18 15  Temp:    SpO2: 100% 100%    Last Pain:  Vitals:   08/31/19 1010  TempSrc:   PainSc: 0-No pain                 Kaio Kuhlman

## 2019-09-01 LAB — SURGICAL PATHOLOGY

## 2019-09-03 ENCOUNTER — Encounter: Payer: Self-pay | Admitting: *Deleted

## 2019-09-15 DIAGNOSIS — R42 Dizziness and giddiness: Secondary | ICD-10-CM | POA: Diagnosis not present

## 2019-09-15 DIAGNOSIS — I10 Essential (primary) hypertension: Secondary | ICD-10-CM | POA: Diagnosis not present

## 2019-09-16 DIAGNOSIS — I10 Essential (primary) hypertension: Secondary | ICD-10-CM | POA: Diagnosis not present

## 2019-09-16 DIAGNOSIS — F4321 Adjustment disorder with depressed mood: Secondary | ICD-10-CM | POA: Diagnosis not present

## 2019-09-16 DIAGNOSIS — C61 Malignant neoplasm of prostate: Secondary | ICD-10-CM | POA: Diagnosis not present

## 2019-09-16 DIAGNOSIS — D500 Iron deficiency anemia secondary to blood loss (chronic): Secondary | ICD-10-CM | POA: Diagnosis not present

## 2019-09-16 DIAGNOSIS — E1165 Type 2 diabetes mellitus with hyperglycemia: Secondary | ICD-10-CM | POA: Diagnosis not present

## 2019-09-16 DIAGNOSIS — E1122 Type 2 diabetes mellitus with diabetic chronic kidney disease: Secondary | ICD-10-CM | POA: Diagnosis not present

## 2019-09-16 DIAGNOSIS — I5030 Unspecified diastolic (congestive) heart failure: Secondary | ICD-10-CM | POA: Diagnosis not present

## 2019-09-16 DIAGNOSIS — Z8546 Personal history of malignant neoplasm of prostate: Secondary | ICD-10-CM | POA: Diagnosis not present

## 2019-09-16 DIAGNOSIS — I2510 Atherosclerotic heart disease of native coronary artery without angina pectoris: Secondary | ICD-10-CM | POA: Diagnosis not present

## 2019-09-16 DIAGNOSIS — E7800 Pure hypercholesterolemia, unspecified: Secondary | ICD-10-CM | POA: Diagnosis not present

## 2019-09-16 DIAGNOSIS — N183 Chronic kidney disease, stage 3 unspecified: Secondary | ICD-10-CM | POA: Diagnosis not present

## 2019-09-16 DIAGNOSIS — E785 Hyperlipidemia, unspecified: Secondary | ICD-10-CM | POA: Diagnosis not present

## 2019-09-22 DIAGNOSIS — K921 Melena: Secondary | ICD-10-CM | POA: Diagnosis not present

## 2019-09-30 ENCOUNTER — Other Ambulatory Visit: Payer: Self-pay | Admitting: Internal Medicine

## 2019-11-04 DIAGNOSIS — E785 Hyperlipidemia, unspecified: Secondary | ICD-10-CM | POA: Diagnosis not present

## 2019-11-04 DIAGNOSIS — I10 Essential (primary) hypertension: Secondary | ICD-10-CM | POA: Diagnosis not present

## 2019-11-04 DIAGNOSIS — I2510 Atherosclerotic heart disease of native coronary artery without angina pectoris: Secondary | ICD-10-CM | POA: Diagnosis not present

## 2019-11-04 DIAGNOSIS — F4321 Adjustment disorder with depressed mood: Secondary | ICD-10-CM | POA: Diagnosis not present

## 2019-11-04 DIAGNOSIS — E7800 Pure hypercholesterolemia, unspecified: Secondary | ICD-10-CM | POA: Diagnosis not present

## 2019-11-04 DIAGNOSIS — Z8546 Personal history of malignant neoplasm of prostate: Secondary | ICD-10-CM | POA: Diagnosis not present

## 2019-11-04 DIAGNOSIS — N183 Chronic kidney disease, stage 3 unspecified: Secondary | ICD-10-CM | POA: Diagnosis not present

## 2019-11-04 DIAGNOSIS — E1169 Type 2 diabetes mellitus with other specified complication: Secondary | ICD-10-CM | POA: Diagnosis not present

## 2019-11-04 DIAGNOSIS — E1165 Type 2 diabetes mellitus with hyperglycemia: Secondary | ICD-10-CM | POA: Diagnosis not present

## 2019-11-04 DIAGNOSIS — E1122 Type 2 diabetes mellitus with diabetic chronic kidney disease: Secondary | ICD-10-CM | POA: Diagnosis not present

## 2019-11-04 DIAGNOSIS — I5030 Unspecified diastolic (congestive) heart failure: Secondary | ICD-10-CM | POA: Diagnosis not present

## 2019-11-04 DIAGNOSIS — D500 Iron deficiency anemia secondary to blood loss (chronic): Secondary | ICD-10-CM | POA: Diagnosis not present

## 2019-11-18 NOTE — Progress Notes (Signed)
CARDIOLOGY OFFICE NOTE  Date:  11/23/2019    Ryan Hunt Date of Birth: 06/22/1931 Medical Record #[id number]  PCP:  Seward Carol, MD  Cardiologist:  Jennings Books    Chief Complaint  Patient presents with  . Follow-up    History of Present Illness: Ryan Hunt is a 86 y.o. male who presents today for a 5 month check. Seen for Dr. Tamala Julian.   He has a history of known CAD with prior LCX stent from 1884, chronic diastolic HF, chronic DOE, DM, HLD and HTN.   When seen in 2019 - ACE had been stopped by Dr. Melvyn Novas due to the complaint of dyspnea. Switched over to ARB. BP not controlled. Norvasc was started. He had had cardiac cath for chest pain prior with stable findings - to manage medically. His shortness of breath has been felt to be more pulmonary from our standpoint.   I saw him back in September of 2020 - he was needing clearance for colonoscopy/EGD - had had noted anemia and blood in the stool. He has been started on iron infusions. He had stable chronic shortness of breath. Endorsed more trouble with walking. He felt like overall he was stable. Last seen in December - felt like he was doing ok - may be needing repeat colonoscopy later this year - ?polyps/ulcer previously noted and may not have had adequate prep. Cardiac status was ok.     The patient does not have symptoms concerning for COVID-19 infection (fever, chills, cough, or new shortness of breath).   Comes in today. Here alone. He feels like he is doing ok. More limited by his back- no real relief with the Tramadol but only using this sporadically.  Almost due for his repeat colonoscopy soon - he has to call to get arranged. No bleeding that he is aware of. Having hard time with his bowels though. He is using mag citrate for this prn. He has been trying to do more at home - going outside to sit on his swing - watches the cars go by. No chest pain. He says his breathing is stable for him. Not dizzy or  lightheaded. No syncope. BP readings from home are reviewed and look good.    Past Medical History:  Diagnosis Date  . Anemia   . Anginal pain ([place])   . Arthritis    "knees"  . Bronchitis 11/2011   "first time for me"  . CAD (coronary artery disease)    With DES circumflex 02/2009  . CHF (congestive heart failure) ([place])   . Coronary atherosclerosis of native coronary artery   . 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 Diastolic heart failure ([place])   . Diverticulosis of colon (without mention of hemorrhage)   . Dyspnea   . Exertional dyspnea    Chronic  . GERD (gastroesophageal reflux disease)   . History of kidney stones   . History of peptic ulcer disease    Remote history  . Hypercholesterolemia   . Hypertension   . Kidney stone    "dr told me my kidney was loaded w/stones; imbedded"  . Labyrinthitis   . Leg edema   . Meralgia paresthetica   . Mixed hyperlipidemia   . Osteoarthrosis, unspecified whether generalized or localized, lower leg   . Palpitations   . Prostate cancer [facility])    s/p prostatectomy  . Type II diabetes mellitus ([place])    dx 3 yrs ago  Past Surgical History:  Procedure Laterality Date  . BACK SURGERY    . BIOPSY  04/09/2019   Procedure: BIOPSY;  Surgeon: Laurence Spates, MD;  Location: [facility];  Service: Endoscopy;;  . CARDIAC CATHETERIZATION    . CATARACT EXTRACTION W/ INTRAOCULAR LENS IMPLANT & ANTERIOR VITRECTOMY, BILATERAL  1990's  . COLONOSCOPY    . COLONOSCOPY WITH PROPOFOL N/A 04/09/2019   Procedure: COLONOSCOPY WITH PROPOFOL;  Surgeon: Laurence Spates, MD;  Location: [facility];  Service: Endoscopy;  Laterality: N/A;  . COLONOSCOPY WITH PROPOFOL N/A 08/31/2019   Procedure: COLONOSCOPY WITH PROPOFOL;  Surgeon: Otis Brace, MD;  Location: [facility];  Service: Gastroenterology;  Laterality: N/A;  . CORONARY ANGIOPLASTY WITH STENT PLACEMENT  02/2009   Stent x 1 (DES circumflex)  . DIAGNOSTIC LAPAROSCOPY    .  ESOPHAGOGASTRODUODENOSCOPY (EGD) WITH PROPOFOL N/A 04/09/2019   Procedure: ESOPHAGOGASTRODUODENOSCOPY (EGD) WITH PROPOFOL;  Surgeon: Laurence Spates, MD;  Location: [facility];  Service: Endoscopy;  Laterality: N/A;  . EYE SURGERY     bil lens implants  . HEMOSTASIS CLIP PLACEMENT  08/31/2019   Procedure: HEMOSTASIS CLIP PLACEMENT;  Surgeon: Otis Brace, MD;  Location: [facility];  Service: Gastroenterology;;  Ascending Polyp   . JOINT REPLACEMENT     s/p left knee  . LASIK Bilateral   . LEFT HEART CATH AND CORONARY ANGIOGRAPHY N/A 02/27/2018   Procedure: LEFT HEART CATH AND CORONARY ANGIOGRAPHY;  Surgeon: Belva Crome, MD;  Location: [facility];  Service: Cardiovascular;  Laterality: N/A;  . LUMBAR LAMINECTOMY/DECOMPRESSION MICRODISCECTOMY N/A 12/23/2014   Procedure:  L2-3 DECOMPRESSION;  Surgeon: Melina Schools, MD;  Location: [facility];  Service: Orthopedics;  Laterality: N/A;  . POLYPECTOMY  04/09/2019   Procedure: POLYPECTOMY;  Surgeon: Laurence Spates, MD;  Location: [facility];  Service: Endoscopy;;  . POLYPECTOMY  08/31/2019   Procedure: POLYPECTOMY;  Surgeon: Otis Brace, MD;  Location: [facility];  Service: Gastroenterology;;  . PROSTATECTOMY  2009   Robotic prostatectomy   . Skin Lumps Removed    . TOTAL KNEE ARTHROPLASTY  12/31/2011   Procedure: TOTAL KNEE ARTHROPLASTY;  Surgeon: Lorn Junes, MD;  Location: [facility];  Service: Orthopedics;  Laterality: Left;  Left total knee arthroplasty  . TOTAL SHOULDER REPLACEMENT  1990's   bilaterally     Medications: Current Meds  Medication Sig  . amLODipine (NORVASC) 10 MG tablet TAKE 1 TABLET DAILY  . aspirin EC 81 MG tablet Take 1 tablet (81 mg total) by mouth daily.  . calcium carbonate (OSCAL) 1500 (600 Ca) MG TABS tablet Take 600 mg by mouth at bedtime.  . docusate sodium (COLACE) 250 MG capsule Take 250 mg by mouth at bedtime.  Marland Kitchen glimepiride (AMARYL) 2 MG tablet Take 2-4 mg by mouth See admin instructions.  Take 4 mg by mouth in the morning &  2 mg by mouth at night.  . isosorbide mononitrate (IMDUR) 30 MG 24 hr tablet Take 30 mg by mouth daily.  Marland Kitchen LANTUS SOLOSTAR 100 UNIT/ML Solostar Pen Inject 22 Units into the skin daily.   . nitroGLYCERIN (NITROSTAT) 0.4 MG SL tablet Place 0.4 mg under the tongue every 5 (five) minutes as needed for chest pain.   . ONE TOUCH ULTRA TEST test strip   . pantoprazole (PROTONIX) 40 MG tablet Take 1 Tablet 30- 60 min before your first and last meals of the day  . rosuvastatin (CRESTOR) 10 MG tablet Take 10 mg by mouth daily.  Marland Kitchen telmisartan (MICARDIS) 80 MG  tablet Take 1 tablet (80 mg total) by mouth daily.  . traMADol (ULTRAM) 50 MG tablet Take 50 mg by mouth 2 (two) times daily as needed for moderate pain (arthritis back pain.).   Marland Kitchen vitamin B-12 (CYANOCOBALAMIN) 1000 MCG tablet Take 1,000 mcg by mouth at bedtime.  . Vitamin D3 (VITAMIN D) 25 MCG tablet Take 1,000 Units by mouth daily.     Allergies: Allergies  Allergen Reactions  . Amoxicillin Hives, Rash and Other (See Comments)    "sores in my mouth" Did it involve swelling of the face/tongue/throat, SOB, or low BP? Yes Did it involve sudden or severe rash/hives, skin peeling, or any reaction on the inside of your mouth or nose? Yes Did you need to seek medical attention at a hospital or doctor's office? Unknown When did it last happen?at least 10 years ago If all above answers are "NO", may proceed with cephalosporin use.   Marland Kitchen Oxycodone Hcl     agitation  . Simvastatin Itching    Social History: The patient  reports that he quit smoking about 48 years ago. His smoking use included cigarettes. He has a 7.50 pack-year smoking history. He has never used smokeless tobacco. He reports current alcohol use. He reports that he does not use drugs.   Family History: The patient's family history includes CAD in his father; COPD in his brother; CVA in his father and mother; Dementia in his mother; Heart disease  in his father; Nephritis in his brother; Prostate cancer in his father; Stroke in his father; Tremor in his mother.   Review of Systems: Please see the history of present illness.   All other systems are reviewed and negative.   Physical Exam: VS:  BP 130/60   Pulse (!) 58   Ht 5\' 8"  (1.727 m)   Wt 219 lb (99.3 kg)   SpO2 97%   BMI 33.30 kg/m  .  BMI Body mass index is 33.3 kg/m.  Wt Readings from Last 3 Encounters:  11/23/19 219 lb (99.3 kg)  08/31/19 215 lb (97.5 kg)  06/17/19 221 lb (100.2 kg)    General: Pleasant. Elderly. Alert and in no acute distress. Remains obese.  Cardiac: Regular rate and rhythm. Outflow murmur noted. No significant edema.  Respiratory:  Lungs are clear to auscultation bilaterally with normal work of breathing.  GI: Soft and nontender.  MS: No deformity or atrophy. Gait and ROM intact.  Skin: Warm and dry. Color is normal.  Neuro:  Strength and sensation are intact and no gross focal deficits noted.  Psych: Alert, appropriate and with normal affect.   LABORATORY DATA:  EKG:  EKG is not ordered today.    Lab Results  Component Value Date   WBC 5.6 06/17/2019   HGB 12.9 (L) 08/31/2019   HCT 38.0 (L) 08/31/2019   PLT 111 (L) 06/17/2019   GLUCOSE 193 (H) 08/31/2019   CHOL 142 02/27/2018   TRIG 75 02/27/2018   HDL 44 02/27/2018   LDLCALC 83 02/27/2018   ALT 32 02/26/2018   AST 45 (H) 02/26/2018   NA 143 08/31/2019   K 4.5 08/31/2019   CL 112 (H) 08/31/2019   CREATININE 2.00 (H) 08/31/2019   BUN 31 (H) 08/31/2019   CO2 22 06/17/2019   TSH 2.00 05/08/2018   INR 1.16 12/24/2011   HGBA1C 8.4 (H) 02/26/2018       BNP (last 3 results) No results for input(s): BNP in the last 8760 hours.  ProBNP (last 3  results) No results for input(s): PROBNP in the last 8760 hours.   Other Studies Reviewed Today:  LEFT HEART CATH AND CORONARY ANGIOGRAPHY8/2019  Conclusion    Dist LAD lesion is 50% stenosed.   Right dominant coronary  anatomy with luminal irregularities in the proximal to mid segment. Irregularities in the PDA and left ventricular branches.  Widely patent left main with distal 20% eccentric narrowing  LAD wraps around the left ventricular apex. Proximal to mid vessel contains lucent 30 to 50% narrowing. The first and second diagonal branches contain 75% ostial narrowing. First septal perforator contains 90% ostial narrowing.  Circumflex is moderate in size and gives origin to a proximal to mid widely patent stent before 3 obtuse marginal branches arise. The first obtuse marginal contains ostial 80% narrowing just distal to the stent margin. The ostium of the circumflex contains 40 to 50% narrowing.  Overall normal LV function. LVEDP upper normal at 17 mmHg. EF estimated to be 55%.  RECOMMENDATIONS:   Symptoms of dyspnea are out of proportion to the degree of coronary disease. The patient has 2 diagonals, a skeletal perforator, a small first obtuse marginal all of which have significant ostial disease but due to relatively small size of best treated with medical therapy. Doubt these vessels are the source of the patient's complaints.  Continue secondary risk factor modification.  Consider pulmonary consultation/other etiologies for dyspnea.  Recommend Aspirin 81mg  daily for moderate CAD.   MonitorStudy Highlights6/2018    NSR  Rate related BBB  SVT salvos  NSR with rate related BBB and SVT salvos    EchoStudy Conclusions5/2018 - Left ventricle: The cavity size was normal. Wall thickness was increased in a pattern of moderate LVH. Doppler parameters are consistent with abnormal left ventricular relaxation (grade 1 diastolic dysfunction).   Assessment/Plan:  1. CAD - prior cath from 2019 noted - no worrisome symptoms - remains on chronic long acting nitrate therapy. Would favor continued medical management.   2. History of anemia - he has had prior heme +  stool - may be looking at repeat colonoscopy later this summer - he has no active cardiac symptoms - would be at increased risk just due to his age. He is not able to really do 4 mets and has multiple co-morbidities. Cath stable from 2019. He is managed medically.   3. HTN - BP is good on his current regimen - would continue Norvasc and Micardis.   4. HLD - on statin - lab by PCP  5. Chronic LBBB  6. History of lower extremity edema - stable with salt restriction and using compression stockings - remains on high dose CCB therapy as well.   7. Chronic DOE - seems stable and unchanged.   8. COVID-19 Education: The signs and symptoms of COVID-19 were discussed with the patient and how to seek care for testing (follow up with PCP or arrange E-visit).  The importance of social distancing, staying at home, hand hygiene and wearing a mask when out in public were discussed today Current medicines are reviewed with the patient today.  The patient does not have concerns regarding medicines other than what has been noted above.  The following changes have been made:  See above.  Labs/ tests ordered today include:   No orders of the defined types were placed in this encounter.    Disposition:   FU with Dr. Tamala Julian in about 4 months. No changes made today. .   Patient is agreeable to this plan  and will call if any problems develop in the interim.   SignedTruitt Merle, NP  11/23/2019 10:49 AM  [facility] [address] [place], [place]  [address] Phone: [phone] Fax: [phone]

## 2019-11-23 ENCOUNTER — Encounter: Payer: Self-pay | Admitting: Nurse Practitioner

## 2019-11-23 ENCOUNTER — Ambulatory Visit (INDEPENDENT_AMBULATORY_CARE_PROVIDER_SITE_OTHER): Payer: PPO | Admitting: Nurse Practitioner

## 2019-11-23 VITALS — BP 130/60 | HR 58 | Ht 68.0 in | Wt 219.0 lb

## 2019-11-23 DIAGNOSIS — E7800 Pure hypercholesterolemia, unspecified: Secondary | ICD-10-CM | POA: Diagnosis not present

## 2019-11-23 DIAGNOSIS — R0600 Dyspnea, unspecified: Secondary | ICD-10-CM

## 2019-11-23 DIAGNOSIS — I10 Essential (primary) hypertension: Secondary | ICD-10-CM

## 2019-11-23 DIAGNOSIS — Z7189 Other specified counseling: Secondary | ICD-10-CM | POA: Diagnosis not present

## 2019-11-23 DIAGNOSIS — I2510 Atherosclerotic heart disease of native coronary artery without angina pectoris: Secondary | ICD-10-CM | POA: Diagnosis not present

## 2019-11-23 DIAGNOSIS — I447 Left bundle-branch block, unspecified: Secondary | ICD-10-CM

## 2019-11-23 DIAGNOSIS — R0609 Other forms of dyspnea: Secondary | ICD-10-CM

## 2019-11-23 NOTE — Patient Instructions (Addendum)
After Visit Summary:  We will be checking the following labs today - NONE   Medication Instructions:    Continue with your current medicines.    If you need a refill on your cardiac medications before your next appointment, please call your pharmacy.     Testing/Procedures To Be Arranged:  N/A  Follow-Up:   See Dr. Tamala Julian in 4 months.     At [facility], you and your health needs are our priority.  As part of our continuing mission to provide you with exceptional heart care, we have created designated Provider Care Teams.  These Care Teams include your primary Cardiologist (physician) and Advanced Practice Providers (APPs -  Physician Assistants and Nurse Practitioners) who all work together to provide you with the care you need, when you need it.  Special Instructions:  . Stay safe, stay home, wash your hands for at least 20 seconds and wear a mask when out in public.  . It was good to talk with you today.    Call the [place] office at [phone] if you have any questions, problems or concerns.

## 2019-12-10 DIAGNOSIS — R899 Unspecified abnormal finding in specimens from other organs, systems and tissues: Secondary | ICD-10-CM | POA: Diagnosis not present

## 2020-01-06 DIAGNOSIS — I2510 Atherosclerotic heart disease of native coronary artery without angina pectoris: Secondary | ICD-10-CM | POA: Diagnosis not present

## 2020-01-06 DIAGNOSIS — E1165 Type 2 diabetes mellitus with hyperglycemia: Secondary | ICD-10-CM | POA: Diagnosis not present

## 2020-01-06 DIAGNOSIS — Z8546 Personal history of malignant neoplasm of prostate: Secondary | ICD-10-CM | POA: Diagnosis not present

## 2020-01-06 DIAGNOSIS — E7800 Pure hypercholesterolemia, unspecified: Secondary | ICD-10-CM | POA: Diagnosis not present

## 2020-01-06 DIAGNOSIS — C61 Malignant neoplasm of prostate: Secondary | ICD-10-CM | POA: Diagnosis not present

## 2020-01-06 DIAGNOSIS — I10 Essential (primary) hypertension: Secondary | ICD-10-CM | POA: Diagnosis not present

## 2020-01-06 DIAGNOSIS — E1122 Type 2 diabetes mellitus with diabetic chronic kidney disease: Secondary | ICD-10-CM | POA: Diagnosis not present

## 2020-01-06 DIAGNOSIS — N183 Chronic kidney disease, stage 3 unspecified: Secondary | ICD-10-CM | POA: Diagnosis not present

## 2020-01-06 DIAGNOSIS — E785 Hyperlipidemia, unspecified: Secondary | ICD-10-CM | POA: Diagnosis not present

## 2020-01-06 DIAGNOSIS — I5030 Unspecified diastolic (congestive) heart failure: Secondary | ICD-10-CM | POA: Diagnosis not present

## 2020-01-06 DIAGNOSIS — D500 Iron deficiency anemia secondary to blood loss (chronic): Secondary | ICD-10-CM | POA: Diagnosis not present

## 2020-01-27 ENCOUNTER — Other Ambulatory Visit: Payer: Self-pay | Admitting: Gastroenterology

## 2020-01-27 DIAGNOSIS — Z8679 Personal history of other diseases of the circulatory system: Secondary | ICD-10-CM | POA: Diagnosis not present

## 2020-01-27 DIAGNOSIS — Z86010 Personal history of colonic polyps: Secondary | ICD-10-CM | POA: Diagnosis not present

## 2020-02-02 DIAGNOSIS — N1832 Chronic kidney disease, stage 3b: Secondary | ICD-10-CM | POA: Diagnosis not present

## 2020-02-02 DIAGNOSIS — K317 Polyp of stomach and duodenum: Secondary | ICD-10-CM | POA: Diagnosis not present

## 2020-02-02 DIAGNOSIS — I2510 Atherosclerotic heart disease of native coronary artery without angina pectoris: Secondary | ICD-10-CM | POA: Diagnosis not present

## 2020-02-02 DIAGNOSIS — E7800 Pure hypercholesterolemia, unspecified: Secondary | ICD-10-CM | POA: Diagnosis not present

## 2020-02-02 DIAGNOSIS — R21 Rash and other nonspecific skin eruption: Secondary | ICD-10-CM | POA: Diagnosis not present

## 2020-02-02 DIAGNOSIS — Z1389 Encounter for screening for other disorder: Secondary | ICD-10-CM | POA: Diagnosis not present

## 2020-02-02 DIAGNOSIS — Z794 Long term (current) use of insulin: Secondary | ICD-10-CM | POA: Diagnosis not present

## 2020-02-02 DIAGNOSIS — Z86010 Personal history of colonic polyps: Secondary | ICD-10-CM | POA: Diagnosis not present

## 2020-02-02 DIAGNOSIS — E1122 Type 2 diabetes mellitus with diabetic chronic kidney disease: Secondary | ICD-10-CM | POA: Diagnosis not present

## 2020-02-02 DIAGNOSIS — I5030 Unspecified diastolic (congestive) heart failure: Secondary | ICD-10-CM | POA: Diagnosis not present

## 2020-02-02 DIAGNOSIS — D509 Iron deficiency anemia, unspecified: Secondary | ICD-10-CM | POA: Diagnosis not present

## 2020-02-02 DIAGNOSIS — Z0000 Encounter for general adult medical examination without abnormal findings: Secondary | ICD-10-CM | POA: Diagnosis not present

## 2020-02-02 DIAGNOSIS — I10 Essential (primary) hypertension: Secondary | ICD-10-CM | POA: Diagnosis not present

## 2020-02-02 DIAGNOSIS — I447 Left bundle-branch block, unspecified: Secondary | ICD-10-CM | POA: Diagnosis not present

## 2020-02-08 DIAGNOSIS — Z961 Presence of intraocular lens: Secondary | ICD-10-CM | POA: Diagnosis not present

## 2020-02-08 DIAGNOSIS — H353131 Nonexudative age-related macular degeneration, bilateral, early dry stage: Secondary | ICD-10-CM | POA: Diagnosis not present

## 2020-02-08 DIAGNOSIS — H04123 Dry eye syndrome of bilateral lacrimal glands: Secondary | ICD-10-CM | POA: Diagnosis not present

## 2020-02-08 DIAGNOSIS — H18413 Arcus senilis, bilateral: Secondary | ICD-10-CM | POA: Diagnosis not present

## 2020-02-22 DIAGNOSIS — I10 Essential (primary) hypertension: Secondary | ICD-10-CM | POA: Diagnosis not present

## 2020-02-22 DIAGNOSIS — E785 Hyperlipidemia, unspecified: Secondary | ICD-10-CM | POA: Diagnosis not present

## 2020-02-22 DIAGNOSIS — E1122 Type 2 diabetes mellitus with diabetic chronic kidney disease: Secondary | ICD-10-CM | POA: Diagnosis not present

## 2020-02-22 DIAGNOSIS — N183 Chronic kidney disease, stage 3 unspecified: Secondary | ICD-10-CM | POA: Diagnosis not present

## 2020-02-22 DIAGNOSIS — I5030 Unspecified diastolic (congestive) heart failure: Secondary | ICD-10-CM | POA: Diagnosis not present

## 2020-02-22 DIAGNOSIS — C61 Malignant neoplasm of prostate: Secondary | ICD-10-CM | POA: Diagnosis not present

## 2020-02-22 DIAGNOSIS — E7800 Pure hypercholesterolemia, unspecified: Secondary | ICD-10-CM | POA: Diagnosis not present

## 2020-02-22 DIAGNOSIS — I2510 Atherosclerotic heart disease of native coronary artery without angina pectoris: Secondary | ICD-10-CM | POA: Diagnosis not present

## 2020-02-22 DIAGNOSIS — D500 Iron deficiency anemia secondary to blood loss (chronic): Secondary | ICD-10-CM | POA: Diagnosis not present

## 2020-02-22 DIAGNOSIS — D509 Iron deficiency anemia, unspecified: Secondary | ICD-10-CM | POA: Diagnosis not present

## 2020-02-22 DIAGNOSIS — E1165 Type 2 diabetes mellitus with hyperglycemia: Secondary | ICD-10-CM | POA: Diagnosis not present

## 2020-03-14 NOTE — Progress Notes (Signed)
Cardiology Office Note:    Date:  03/17/2020   ID:  Ryan Hunt, DOB 02-13-1931, MRN [id number]  PCP:  Seward Carol, MD  Cardiologist:  Sinclair Grooms, MD   Referring MD: Seward Carol, MD   Chief Complaint  Patient presents with  . Coronary Artery Disease  . Advice Only    slow heart rate    History of Present Illness:    Ryan Hunt is a 86 y.o. male with a hx of CAD with circumflex stent 7741, diastolic heart failure, chronic dyspnea on exertion, DM II, hyperlipidemia, and dietary indiscretion.  There is also a history of "bronchitis".  Ryan Hunt voices no complaints. He shares records from home blood pressure heart rate oxygen saturation recordings done daily over the last 12 months. I do note that there has been a gradual decrease in heart rate from the mid 50s to the mid to low 40s. No heart rates above 58 bpm are noted. Despite this he states energy level is good, there has been no syncope or presyncope, and he denies palpitations.  He denies angina, orthopnea, and peripheral edema.  Past Medical History:  Diagnosis Date  . Anemia   . Anginal pain ([place])   . Arthritis    "knees"  . Bronchitis 11/2011   "first time for me"  . CAD (coronary artery disease)    With DES circumflex 02/2009  . CHF (congestive heart failure) ([place])   . Coronary atherosclerosis of native coronary artery   . 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 Diastolic heart failure ([place])   . Diverticulosis of colon (without mention of hemorrhage)   . Dyspnea   . Exertional dyspnea    Chronic  . GERD (gastroesophageal reflux disease)   . History of kidney stones   . History of peptic ulcer disease    Remote history  . Hypercholesterolemia   . Hypertension   . Kidney stone    "dr told me my kidney was loaded w/stones; imbedded"  . Labyrinthitis   . Leg edema   . Meralgia paresthetica   . Mixed hyperlipidemia   . Osteoarthrosis, unspecified whether generalized or localized,  lower leg   . Palpitations   . Prostate cancer [facility])    s/p prostatectomy  . Type II diabetes mellitus (HCC)    dx 3 yrs ago    Past Surgical History:  Procedure Laterality Date  . BACK SURGERY    . BIOPSY  04/09/2019   Procedure: BIOPSY;  Surgeon: Laurence Spates, MD;  Location: [facility];  Service: Endoscopy;;  . CARDIAC CATHETERIZATION    . CATARACT EXTRACTION W/ INTRAOCULAR LENS IMPLANT & ANTERIOR VITRECTOMY, BILATERAL  1990's  . COLONOSCOPY    . COLONOSCOPY WITH PROPOFOL N/A 04/09/2019   Procedure: COLONOSCOPY WITH PROPOFOL;  Surgeon: Laurence Spates, MD;  [facility]: [facility];  Service: Endoscopy;  Laterality: N/A;  . COLONOSCOPY WITH PROPOFOL N/A 08/31/2019   Procedure: COLONOSCOPY WITH PROPOFOL;  Surgeon: Otis Brace, MD;  Location: [facility];  Service: Gastroenterology;  Laterality: N/A;  . CORONARY ANGIOPLASTY WITH STENT PLACEMENT  02/2009   Stent x 1 (DES circumflex)  . DIAGNOSTIC LAPAROSCOPY    . ESOPHAGOGASTRODUODENOSCOPY (EGD) WITH PROPOFOL N/A 04/09/2019   Procedure: ESOPHAGOGASTRODUODENOSCOPY (EGD) WITH PROPOFOL;  Surgeon: Laurence Spates, MD;  [facility];  Service: Endoscopy;  Laterality: N/A;  . EYE SURGERY     bil lens implants  . HEMOSTASIS CLIP PLACEMENT  08/31/2019   Procedure: HEMOSTASIS CLIP PLACEMENT;  Surgeon: Otis Brace, MD;  Location: [facility];  Service: Gastroenterology;;  Ascending Polyp   . JOINT REPLACEMENT     s/p left knee  . LASIK Bilateral   . LEFT HEART CATH AND CORONARY ANGIOGRAPHY N/A 02/27/2018   Procedure: LEFT HEART CATH AND CORONARY ANGIOGRAPHY;  Surgeon: Belva Crome, MD;  Location: [place] CV LAB;  Service: Cardiovascular;  Laterality: N/A;  . LUMBAR LAMINECTOMY/DECOMPRESSION MICRODISCECTOMY N/A 12/23/2014   Procedure:  L2-3 DECOMPRESSION;  Surgeon: Melina Schools, MD;  Location: [place];  Service: Orthopedics;  Laterality: N/A;  . POLYPECTOMY  04/09/2019   Procedure: POLYPECTOMY;  Surgeon: Laurence Spates,  MD;  [facility];  Service: Endoscopy;;  . POLYPECTOMY  08/31/2019   Procedure: POLYPECTOMY;  Surgeon: Otis Brace, MD;  [facility];  Service: Gastroenterology;;  . PROSTATECTOMY  2009   Robotic prostatectomy   . Skin Lumps Removed    . TOTAL KNEE ARTHROPLASTY  12/31/2011   Procedure: TOTAL KNEE ARTHROPLASTY;  Surgeon: Lorn Junes, MD;  Location: [place];  Service: Orthopedics;  Laterality: Left;  Left total knee arthroplasty  . TOTAL SHOULDER REPLACEMENT  1990's   bilaterally    Current Medications: Current Meds  Medication Sig  . amLODipine (NORVASC) 10 MG tablet TAKE 1 TABLET DAILY  . aspirin EC 81 MG tablet Take 1 tablet (81 mg total) by mouth daily.  . calcium carbonate (OSCAL) 1500 (600 Ca) MG TABS tablet Take 600 mg by mouth at bedtime.  . docusate sodium (COLACE) 250 MG capsule Take 250 mg by mouth at bedtime.  . furosemide (LASIX) 40 MG tablet Take 40 mg by mouth 2 (two) times daily.   Marland Kitchen glimepiride (AMARYL) 2 MG tablet Take 2-4 mg by mouth See admin instructions. Take 4 mg by mouth in the morning &  2 mg by mouth at night.  . isosorbide mononitrate (IMDUR) 30 MG 24 hr tablet Take 30 mg by mouth daily.  Marland Kitchen LANTUS SOLOSTAR 100 UNIT/ML Solostar Pen Inject 22 Units into the skin daily.   . nitroGLYCERIN (NITROSTAT) 0.4 MG SL tablet Place 0.4 mg under the tongue every 5 (five) minutes as needed for chest pain.   . ONE TOUCH ULTRA TEST test strip   . pantoprazole (PROTONIX) 40 MG tablet Take 1 Tablet 30- 60 min before your first and last meals of the day  . rosuvastatin (CRESTOR) 10 MG tablet Take 10 mg by mouth daily.  Marland Kitchen telmisartan (MICARDIS) 80 MG tablet Take 1 tablet (80 mg total) by mouth daily.  . traMADol (ULTRAM) 50 MG tablet Take 50 mg by mouth 2 (two) times daily as needed for moderate pain (arthritis back pain.).   Marland Kitchen vitamin B-12 (CYANOCOBALAMIN) 1000 MCG tablet Take 1,000 mcg by mouth at bedtime.  . Vitamin D3 (VITAMIN D) 25 MCG tablet Take  1,000 Units by mouth daily.     Allergies:   Amoxicillin, Oxycodone hcl, and Simvastatin   Social History   Socioeconomic History  . Marital status: Widowed    Spouse name: Not on file  . Number of children: 1  . Years of education: Not on file  . Highest education level: Not on file  Occupational History  . Occupation: Retired  Tobacco Use  . Smoking status: Former Smoker    Packs/day: 0.50    Years: 15.00    Pack years: 7.50    Types: Cigarettes    Quit date: 07/10/1971    Years since quitting: 48.7  . Smokeless tobacco: Never  Used  Vaping Use  . Vaping Use: Never used  Substance and Sexual Activity  . Alcohol use: Yes    Comment: 01/01/12 "occassionall have a glass of beer or wine; not even once a week"  . Drug use: No  . Sexual activity: Never  Other Topics Concern  . Not on file  Social History Narrative  . Not on file   Social Determinants of Health   Financial Resource Strain:   . Difficulty of Paying Living Expenses: Not on file  Food Insecurity:   . Worried About Charity fundraiser in the Last Year: Not on file  . Ran Out of Food in the Last Year: Not on file  Transportation Needs:   . Lack of Transportation (Medical): Not on file  . Lack of Transportation (Non-Medical): Not on file  Physical Activity:   . Days of Exercise per Week: Not on file  . Minutes of Exercise per Session: Not on file  Stress:   . Feeling of Stress : Not on file  Social Connections:   . Frequency of Communication with Friends and Family: Not on file  . Frequency of Social Gatherings with Friends and Family: Not on file  . Attends Religious Services: Not on file  . Active Member of Clubs or Organizations: Not on file  . Attends Archivist Meetings: Not on file  . Marital Status: Not on file     Family History: The patient's family history includes CAD in his father; COPD in his brother; CVA in his father and mother; Dementia in his mother; Heart disease in his  father; Nephritis in his brother; Prostate cancer in his father; Stroke in his father; Tremor in his mother. There is no history of Heart attack.  ROS:   Please see the history of present illness.    He lives alone. Wife is deceased. He has to climb stairs from his basement to the main level of his house. He is cleaning out his basement which contained significant amounts of particles collected by his wife at yard sales. He calls a "junk". He gathers the particles, puts him in a bucket, ties a string to the bucket, and pulls the bucket up the stairs. He does this several times a day. He has had no difficulty going up and down the stairs. Denies angina. Has not had imbalance or falls. I did make him aware that I was concerned about this and the possibility of injury and then being isolated. He does have friends that check on him. All other systems reviewed and are negative.  EKGs/Labs/Other Studies Reviewed:    The following studies were reviewed today: No new data  EKG:  EKG Marked sinus bradycardia at 46 bpm, first-degree AV block, left bundle branch block with QRS duration 142 ms. Compared to the most recent prior tracing in September 2020, the heart rate is slower and PR interval longer..  Recent Labs: 06/17/2019: Platelets 111 08/31/2019: BUN 31; Creatinine, Ser 2.00; Hemoglobin 12.9; Potassium 4.5; Sodium 143  Recent Lipid Panel    Component Value Date/Time   CHOL 142 02/27/2018 0702   TRIG 75 02/27/2018 0702   HDL 44 02/27/2018 0702   CHOLHDL 3.2 02/27/2018 0702   VLDL 15 02/27/2018 0702   LDLCALC 83 02/27/2018 0702    Physical Exam:    VS:  BP 128/64   Pulse (!) 46   Ht 5' 8.5" (1.74 m)   Wt 219 lb 12.8 oz (99.7 kg)   BMI  32.93 kg/m     Wt Readings from Last 3 Encounters:  03/17/20 219 lb 12.8 oz (99.7 kg)  11/23/19 219 lb (99.3 kg)  08/31/19 215 lb (97.5 kg)     GEN: Compatible with age. No acute distress HEENT: Normal NECK: No JVD. LYMPHATICS: No  lymphadenopathy CARDIAC: 2-3 over 6 mid left sternal and right upper sternal border crescendo decrescendo systolic murmur. Radiates into both carotids but loudest in the left. RRR without diastolic murmur, gallop, or edema. VASCULAR:  Normal Pulses. No bruits. RESPIRATORY:  Clear to auscultation without rales, wheezing or rhonchi  ABDOMEN: Soft, non-tender, non-distended, No pulsatile mass, MUSCULOSKELETAL: No deformity  SKIN: Warm and dry NEUROLOGIC:  Alert and oriented x 3 PSYCHIATRIC:  Normal affect   ASSESSMENT:    1. Coronary artery disease involving native coronary artery of native heart without angina pectoris   2. Slow heart rate   3. Systolic murmur   4. Essential hypertension   5. LBBB (left bundle branch block)   6. Hypercholesterolemia   7. Type 2 diabetes mellitus with complication, without long-term current use of insulin ([place])   8. Educated about COVID-19 virus infection    PLAN:    In order of problems listed above:  1. Asymptomatic. Secondary prevention discussed including continued use of Crestor. Aerobic activity as tolerated and safe is also encouraged. 2. Asymptomatic bradycardia. 48-hour Holter monitor to assess for high-grade AV block or excessive pauses. Also interested in chronotropic competence. Not on medications that would slow heart rate. 3. 2D Doppler echocardiogram to rule out aortic stenosis. The murmur is heard particularly loudly in the left carotid. May have localized left carotid disease as well. 4. Blood pressures well controlled on Micardis, Imdur, Lasix, and Norvasc. He is not on beta-blocker therapy. 5. Unchanged 6. Statin therapy with Crestor 10 mg maintaining LDL near 70 when blood work drawn this July by Dr. Delfina Redwood. 7. Hemoglobin A1c target less than 7. Was 7.7 when last evaluated. Consider adding SGLT2. It would also help with renal preservation. 8. Has been vaccinated and has not had active Covid infection.  The monitor will help  determine if there is any tendency towards bradycardia severe enough to require pacing. He lives alone, is relatively frail, and scenario of syncope with inability to contact others is worrisome.  Medication Adjustments/Labs and Tests Ordered: Current medicines are reviewed at length with the patient today.  Concerns regarding medicines are outlined above.  Orders Placed This Encounter  Procedures  . LONG TERM MONITOR (3-14 DAYS)  . EKG 12-Lead  . ECHOCARDIOGRAM COMPLETE   No orders of the defined types were placed in this encounter.   Patient Instructions  Medication Instructions:  Your physician recommends that you continue on your current medications as directed. Please refer to the Current Medication list given to you today.  *If you need a refill on your cardiac medications before your next appointment, please call your pharmacy*   Lab Work: None  If you have labs (blood work) drawn today and your tests are completely normal, you will receive your results only by: Marland Kitchen MyChart Message (if you have MyChart) OR . A paper copy in the mail If you have any lab test that is abnormal or we need to change your treatment, we will call you to review the results.   Testing/Procedures: Your physician has requested that you have an echocardiogram. Echocardiography is a painless test that uses sound waves to create images of your heart. It provides your doctor with  information about the size and shape of your heart and how well your heart's chambers and valves are working. This procedure takes approximately one hour. There are no restrictions for this procedure.  Your physician recommends that you wear a monitor for 3 days.    Follow-Up: At [facility], you and your health needs are our priority.  As part of our continuing mission to provide you with exceptional heart care, we have created designated Provider Care Teams.  These Care Teams include your primary Cardiologist (physician) and  Advanced Practice Providers (APPs -  Physician Assistants and Nurse Practitioners) who all work together to provide you with the care you need, when you need it.  We recommend signing up for the patient portal called "MyChart".  Sign up information is provided on this After Visit Summary.  MyChart is used to connect with patients for Virtual Visits (Telemedicine).  Patients are able to view lab/test results, encounter notes, upcoming appointments, etc.  Non-urgent messages can be sent to your provider as well.   To learn more about what you can do with MyChart, go to [email or web address].    Your next appointment:   12 month(s)  The format for your next appointment:   In Person  Provider:   You may see Sinclair Grooms, MD or one of the following Advanced Practice Providers on your designated Care Team:    Truitt Merle, NP  Cecilie Kicks, NP  Kathyrn Drown, NP    Other Instructions  [place] Monitor Instructions   Your physician has requested you wear your ZIO patch monitor__3__days.   This is a single patch monitor.  [employer] supplies one patch monitor per enrollment.  Additional stickers are not available.   Please do not apply patch if you will be having a Nuclear Stress Test, Echocardiogram, Cardiac CT, MRI, or Chest Xray during the time frame you would be wearing the monitor. The patch cannot be worn during these tests.  You cannot remove and re-apply the ZIO XT patch monitor.   Your ZIO patch monitor will be sent USPS Priority mail from [facility] directly to your home address. The monitor may also be mailed to a PO BOX if home delivery is not available.   It may take 3-5 days to receive your monitor after you have been enrolled.   Once you have received you monitor, please review enclosed instructions.  Your monitor has already been registered assigning a specific monitor serial # to you.   Applying the monitor   Shave hair from upper left chest.    Hold abrader disc by orange tab.  Rub abrader in 40 strokes over left upper chest as indicated in your monitor instructions.   Clean area with 4 enclosed alcohol pads .  Use all pads to assure are is cleaned thoroughly.  Let dry.   Apply patch as indicated in monitor instructions.  Patch will be place under collarbone on left side of chest with arrow pointing upward.   Rub patch adhesive wings for 2 minutes.Remove white label marked "1".  Remove white label marked "2".  Rub patch adhesive wings for 2 additional minutes.   While looking in a mirror, press and release button in center of patch.  A small green light will flash 3-4 times .  This will be your only indicator the monitor has been turned on.     Do not shower for the first 24 hours.  You may shower after the first 24  hours.   Press button if you feel a symptom. You will hear a small click.  Record Date, Time and Symptom in the Patient Log Book.   When you are ready to remove patch, follow instructions on last 2 pages of Patient Log Book.  Stick patch monitor onto last page of Patient Log Book.   Place Patient Log Book in [place] box.  Use locking tab on box and tape box closed securely.  The [employer] and [employer] has [employer] on it.  Please place in mailbox as soon as possible.  Your physician should have your test results approximately 7 days after the monitor has been mailed back to [facility].   Call [place] at [phone] if you have questions regarding your ZIO XT patch monitor.  Call them immediately if you see an orange light blinking on your monitor.   If your monitor falls off in less than 4 days contact our Monitor department at [phone].  If your monitor becomes loose or falls off after 4 days call [employer] at [phone] for suggestions on securing your monitor.       Signed, Sinclair Grooms, MD  03/17/2020 12:45 PM    [place]

## 2020-03-17 ENCOUNTER — Ambulatory Visit: Payer: PPO | Admitting: Interventional Cardiology

## 2020-03-17 ENCOUNTER — Other Ambulatory Visit: Payer: Self-pay

## 2020-03-17 ENCOUNTER — Telehealth: Payer: Self-pay | Admitting: Radiology

## 2020-03-17 ENCOUNTER — Encounter: Payer: Self-pay | Admitting: Interventional Cardiology

## 2020-03-17 VITALS — BP 128/64 | HR 46 | Ht 68.5 in | Wt 219.8 lb

## 2020-03-17 DIAGNOSIS — I447 Left bundle-branch block, unspecified: Secondary | ICD-10-CM | POA: Diagnosis not present

## 2020-03-17 DIAGNOSIS — R001 Bradycardia, unspecified: Secondary | ICD-10-CM

## 2020-03-17 DIAGNOSIS — E7800 Pure hypercholesterolemia, unspecified: Secondary | ICD-10-CM

## 2020-03-17 DIAGNOSIS — I10 Essential (primary) hypertension: Secondary | ICD-10-CM | POA: Diagnosis not present

## 2020-03-17 DIAGNOSIS — R011 Cardiac murmur, unspecified: Secondary | ICD-10-CM | POA: Diagnosis not present

## 2020-03-17 DIAGNOSIS — E118 Type 2 diabetes mellitus with unspecified complications: Secondary | ICD-10-CM | POA: Diagnosis not present

## 2020-03-17 DIAGNOSIS — Z7189 Other specified counseling: Secondary | ICD-10-CM | POA: Diagnosis not present

## 2020-03-17 DIAGNOSIS — I2510 Atherosclerotic heart disease of native coronary artery without angina pectoris: Secondary | ICD-10-CM | POA: Diagnosis not present

## 2020-03-17 NOTE — Telephone Encounter (Signed)
Enrolled patient for a 3 day Zio XT monitor to be mailed to patients home  

## 2020-03-17 NOTE — Patient Instructions (Signed)
Medication Instructions:  Your physician recommends that you continue on your current medications as directed. Please refer to the Current Medication list given to you today.  *If you need a refill on your cardiac medications before your next appointment, please call your pharmacy*   Lab Work: None  If you have labs (blood work) drawn today and your tests are completely normal, you will receive your results only by: Marland Kitchen MyChart Message (if you have MyChart) OR . A paper copy in the mail If you have any lab test that is abnormal or we need to change your treatment, we will call you to review the results.   Testing/Procedures: Your physician has requested that you have an echocardiogram. Echocardiography is a painless test that uses sound waves to create images of your heart. It provides your doctor with information about the size and shape of your heart and how well your heart's chambers and valves are working. This procedure takes approximately one hour. There are no restrictions for this procedure.  Your physician recommends that you wear a monitor for 3 days.    Follow-Up: At [facility], you and your health needs are our priority.  As part of our continuing mission to provide you with exceptional heart care, we have created designated Provider Care Teams.  These Care Teams include your primary Cardiologist (physician) and Advanced Practice Providers (APPs -  Physician Assistants and Nurse Practitioners) who all work together to provide you with the care you need, when you need it.  We recommend signing up for the patient portal called "MyChart".  Sign up information is provided on this After Visit Summary.  MyChart is used to connect with patients for Virtual Visits (Telemedicine).  Patients are able to view lab/test results, encounter notes, upcoming appointments, etc.  Non-urgent messages can be sent to your provider as well.   To learn more about what you can do with MyChart, go to  [email or web address].    Your next appointment:   12 month(s)  The format for your next appointment:   In Person  Provider:   You may see Sinclair Grooms, MD or one of the following Advanced Practice Providers on your designated Care Team:    Truitt Merle, NP  Cecilie Kicks, NP  Kathyrn Drown, NP    Other Instructions  [place] Monitor Instructions   Your physician has requested you wear your ZIO patch monitor__3__days.   This is a single patch monitor.  [employer] supplies one patch monitor per enrollment.  Additional stickers are not available.   Please do not apply patch if you will be having a Nuclear Stress Test, Echocardiogram, Cardiac CT, MRI, or Chest Xray during the time frame you would be wearing the monitor. The patch cannot be worn during these tests.  You cannot remove and re-apply the ZIO XT patch monitor.   Your ZIO patch monitor will be sent USPS Priority mail from [facility] directly to your home address. The monitor may also be mailed to a PO BOX if home delivery is not available.   It may take 3-5 days to receive your monitor after you have been enrolled.   Once you have received you monitor, please review enclosed instructions.  Your monitor has already been registered assigning a specific monitor serial # to you.   Applying the monitor   Shave hair from upper left chest.   Hold abrader disc by orange tab.  Rub abrader in 40 strokes over left upper  chest as indicated in your monitor instructions.   Clean area with 4 enclosed alcohol pads .  Use all pads to assure are is cleaned thoroughly.  Let dry.   Apply patch as indicated in monitor instructions.  Patch will be place under collarbone on left side of chest with arrow pointing upward.   Rub patch adhesive wings for 2 minutes.Remove white label marked "1".  Remove white label marked "2".  Rub patch adhesive wings for 2 additional minutes.   While looking in a mirror, press and  release button in center of patch.  A small green light will flash 3-4 times .  This will be your only indicator the monitor has been turned on.     Do not shower for the first 24 hours.  You may shower after the first 24 hours.   Press button if you feel a symptom. You will hear a small click.  Record Date, Time and Symptom in the Patient Log Book.   When you are ready to remove patch, follow instructions on last 2 pages of Patient Log Book.  Stick patch monitor onto last page of Patient Log Book.   Place Patient Log Book in [place] box.  Use locking tab on box and tape box closed securely.  The [employer] has [employer] on it.  Please place in mailbox as soon as possible.  Your physician should have your test results approximately 7 days after the monitor has been mailed back to [facility].   Call [place] at [phone] if you have questions regarding your ZIO XT patch monitor.  Call them immediately if you see an orange light blinking on your monitor.   If your monitor falls off in less than 4 days contact our Monitor department at [phone].  If your monitor becomes loose or falls off after 4 days call [employer] at [phone] for suggestions on securing your monitor.

## 2020-03-25 ENCOUNTER — Other Ambulatory Visit (HOSPITAL_COMMUNITY)
Admission: RE | Admit: 2020-03-25 | Discharge: 2020-03-25 | Disposition: A | Discharge status: Home / Self Care | Payer: PPO | Source: Ambulatory Visit | Attending: Gastroenterology | Admitting: Gastroenterology

## 2020-03-25 DIAGNOSIS — Z20822 Contact with and (suspected) exposure to covid-19: Secondary | ICD-10-CM | POA: Insufficient documentation

## 2020-03-25 DIAGNOSIS — Z01812 Encounter for preprocedural laboratory examination: Secondary | ICD-10-CM | POA: Insufficient documentation

## 2020-03-25 LAB — SARS CORONAVIRUS 2 (TAT 6-24 HRS): SARS Coronavirus 2: NEGATIVE

## 2020-03-29 ENCOUNTER — Ambulatory Visit (HOSPITAL_COMMUNITY)
Admission: RE | Admit: 2020-03-29 | Discharge: 2020-03-29 | Disposition: A | Discharge status: Home / Self Care | Payer: PPO | Attending: Gastroenterology | Admitting: Gastroenterology

## 2020-03-29 ENCOUNTER — Ambulatory Visit (HOSPITAL_COMMUNITY): Payer: PPO | Admitting: Certified Registered Nurse Anesthetist

## 2020-03-29 ENCOUNTER — Other Ambulatory Visit: Payer: Self-pay

## 2020-03-29 ENCOUNTER — Encounter (HOSPITAL_COMMUNITY)
Admission: RE | Disposition: A | Discharge status: Home / Self Care | Payer: Self-pay | Source: Home / Self Care | Attending: Gastroenterology

## 2020-03-29 ENCOUNTER — Encounter (HOSPITAL_COMMUNITY): Payer: Self-pay | Admitting: Gastroenterology

## 2020-03-29 DIAGNOSIS — Z955 Presence of coronary angioplasty implant and graft: Secondary | ICD-10-CM | POA: Diagnosis not present

## 2020-03-29 DIAGNOSIS — I2510 Atherosclerotic heart disease of native coronary artery without angina pectoris: Secondary | ICD-10-CM | POA: Diagnosis not present

## 2020-03-29 DIAGNOSIS — Z7982 Long term (current) use of aspirin: Secondary | ICD-10-CM | POA: Insufficient documentation

## 2020-03-29 DIAGNOSIS — Z09 Encounter for follow-up examination after completed treatment for conditions other than malignant neoplasm: Secondary | ICD-10-CM | POA: Insufficient documentation

## 2020-03-29 DIAGNOSIS — I130 Hypertensive heart and chronic kidney disease with heart failure and stage 1 through stage 4 chronic kidney disease, or unspecified chronic kidney disease: Secondary | ICD-10-CM | POA: Insufficient documentation

## 2020-03-29 DIAGNOSIS — K219 Gastro-esophageal reflux disease without esophagitis: Secondary | ICD-10-CM | POA: Diagnosis not present

## 2020-03-29 DIAGNOSIS — Z96652 Presence of left artificial knee joint: Secondary | ICD-10-CM | POA: Diagnosis not present

## 2020-03-29 DIAGNOSIS — I5032 Chronic diastolic (congestive) heart failure: Secondary | ICD-10-CM | POA: Insufficient documentation

## 2020-03-29 DIAGNOSIS — K5730 Diverticulosis of large intestine without perforation or abscess without bleeding: Secondary | ICD-10-CM | POA: Diagnosis not present

## 2020-03-29 DIAGNOSIS — Z96611 Presence of right artificial shoulder joint: Secondary | ICD-10-CM | POA: Diagnosis not present

## 2020-03-29 DIAGNOSIS — D124 Benign neoplasm of descending colon: Secondary | ICD-10-CM | POA: Diagnosis not present

## 2020-03-29 DIAGNOSIS — I110 Hypertensive heart disease with heart failure: Secondary | ICD-10-CM | POA: Diagnosis not present

## 2020-03-29 DIAGNOSIS — E782 Mixed hyperlipidemia: Secondary | ICD-10-CM | POA: Diagnosis not present

## 2020-03-29 DIAGNOSIS — Z87891 Personal history of nicotine dependence: Secondary | ICD-10-CM | POA: Insufficient documentation

## 2020-03-29 DIAGNOSIS — M1990 Unspecified osteoarthritis, unspecified site: Secondary | ICD-10-CM | POA: Diagnosis not present

## 2020-03-29 DIAGNOSIS — Z79899 Other long term (current) drug therapy: Secondary | ICD-10-CM | POA: Insufficient documentation

## 2020-03-29 DIAGNOSIS — K635 Polyp of colon: Secondary | ICD-10-CM | POA: Diagnosis not present

## 2020-03-29 DIAGNOSIS — D125 Benign neoplasm of sigmoid colon: Secondary | ICD-10-CM | POA: Insufficient documentation

## 2020-03-29 DIAGNOSIS — N189 Chronic kidney disease, unspecified: Secondary | ICD-10-CM | POA: Insufficient documentation

## 2020-03-29 DIAGNOSIS — Z8719 Personal history of other diseases of the digestive system: Secondary | ICD-10-CM | POA: Insufficient documentation

## 2020-03-29 DIAGNOSIS — K648 Other hemorrhoids: Secondary | ICD-10-CM | POA: Insufficient documentation

## 2020-03-29 DIAGNOSIS — E1122 Type 2 diabetes mellitus with diabetic chronic kidney disease: Secondary | ICD-10-CM | POA: Diagnosis not present

## 2020-03-29 DIAGNOSIS — I25110 Atherosclerotic heart disease of native coronary artery with unstable angina pectoris: Secondary | ICD-10-CM | POA: Diagnosis not present

## 2020-03-29 DIAGNOSIS — Z96612 Presence of left artificial shoulder joint: Secondary | ICD-10-CM | POA: Insufficient documentation

## 2020-03-29 DIAGNOSIS — D122 Benign neoplasm of ascending colon: Secondary | ICD-10-CM | POA: Insufficient documentation

## 2020-03-29 DIAGNOSIS — D120 Benign neoplasm of cecum: Secondary | ICD-10-CM | POA: Diagnosis not present

## 2020-03-29 HISTORY — PX: HEMOSTASIS CONTROL: SHX6838

## 2020-03-29 HISTORY — PX: COLONOSCOPY WITH PROPOFOL: SHX5780

## 2020-03-29 HISTORY — PX: POLYPECTOMY: SHX5525

## 2020-03-29 LAB — GLUCOSE, CAPILLARY
Glucose-Capillary: 149 mg/dL — ABNORMAL HIGH (ref 70–99)
Glucose-Capillary: 141 mg/dL — ABNORMAL HIGH (ref 70–99)

## 2020-03-29 SURGERY — COLONOSCOPY WITH PROPOFOL
Anesthesia: Monitor Anesthesia Care

## 2020-03-29 MED ORDER — LIDOCAINE 2% (20 MG/ML) 5 ML SYRINGE
INTRAMUSCULAR | Status: DC | PRN
  Administered 2020-03-29: 50 mg via INTRAVENOUS

## 2020-03-29 MED ORDER — SODIUM CHLORIDE 0.9 % IV SOLN
INTRAVENOUS | Status: DC
  Administered 2020-03-29: 500 mL via INTRAVENOUS

## 2020-03-29 MED ORDER — ONDANSETRON HCL 4 MG/2ML IJ SOLN
INTRAMUSCULAR | Status: DC | PRN
  Administered 2020-03-29: 4 mg via INTRAVENOUS

## 2020-03-29 MED ORDER — EPHEDRINE SULFATE-NACL 50-0.9 MG/10ML-% IV SOSY
PREFILLED_SYRINGE | INTRAVENOUS | Status: DC | PRN
  Administered 2020-03-29 (×3): 10 mg via INTRAVENOUS

## 2020-03-29 MED ORDER — PROPOFOL 500 MG/50ML IV EMUL
INTRAVENOUS | Status: DC | PRN
  Administered 2020-03-29: 75 ug/kg/min via INTRAVENOUS

## 2020-03-29 MED ORDER — PROPOFOL 10 MG/ML IV BOLUS
INTRAVENOUS | Status: AC
  Filled 2020-03-29: qty 20

## 2020-03-29 MED ORDER — EPHEDRINE SULFATE 50 MG/ML IJ SOLN: INTRAMUSCULAR | Status: DC | PRN

## 2020-03-29 MED ORDER — PROPOFOL 10 MG/ML IV BOLUS
INTRAVENOUS | Status: DC | PRN
  Administered 2020-03-29 (×6): 10 mg via INTRAVENOUS

## 2020-03-29 SURGICAL SUPPLY — 22 items

## 2020-03-29 NOTE — Anesthesia Preprocedure Evaluation (Addendum)
Anesthesia Evaluation  Patient identified by MRN, date of birth, ID band Patient awake    Reviewed: Allergy & Precautions, NPO status , Patient's Chart, lab work & pertinent test results  Airway Mallampati: II  TM Distance: >3 FB Neck ROM: Full    Dental  (+) Edentulous Upper, Edentulous Lower   Pulmonary shortness of breath, former smoker,    Pulmonary exam normal breath sounds clear to auscultation       Cardiovascular hypertension, Pt. on medications + angina + CAD, + Cardiac Stents and +CHF  + dysrhythmias  Rhythm:Regular Rate:Normal + Systolic murmurs LHC 0/9311  Right dominant coronary anatomy with luminal irregularities in the proximal to mid segment.  Irregularities in the PDA and left ventricular branches.  Widely patent left main with distal 20% eccentric narrowing  LAD wraps around the left ventricular apex.  Proximal to mid vessel contains lucent 30 to 50% narrowing.  The first and second diagonal branches contain 75% ostial narrowing.  First septal perforator contains 90% ostial narrowing.  Circumflex is moderate in size and gives origin to a proximal to mid widely patent stent before 3 obtuse marginal branches arise.  The first obtuse marginal contains ostial 80% narrowing just distal to the stent margin.  The ostium of the circumflex contains 40 to 50% narrowing.  Overall normal LV function.  LVEDP upper normal at 17 mmHg.  EF estimated to be 55%.  RECOMMENDATIONS:   Symptoms of dyspnea are out of proportion to the degree of coronary disease.  The patient has 2 diagonals, a skeletal perforator, a small first obtuse marginal all of which have significant ostial disease but due to relatively small size of best treated with medical therapy.  Doubt these vessels are the source of the patient's complaints.  Continue secondary risk factor modification.  Consider pulmonary consultation/other etiologies for dyspnea.     Neuro/Psych  Neuromuscular disease    GI/Hepatic Neg liver ROS, GERD  ,  Endo/Other  diabetes  Renal/GU Renal disease     Musculoskeletal  (+) Arthritis ,   Abdominal   Peds  Hematology  (+) anemia ,   Anesthesia Other Findings   Reproductive/Obstetrics                           Anesthesia Physical  Anesthesia Plan  ASA: III  Anesthesia Plan: MAC   Post-op Pain Management:    Induction: Intravenous  PONV Risk Score and Plan: 2 and Propofol infusion and Ondansetron  Airway Management Planned: Nasal Cannula and Simple Face Mask  Additional Equipment: None  Intra-op Plan:   Post-operative Plan:   Informed Consent: I have reviewed the patients History and Physical, chart, labs and discussed the procedure including the risks, benefits and alternatives for the proposed anesthesia with the patient or authorized representative who has indicated his/her understanding and acceptance.     Dental advisory given  Plan Discussed with: CRNA  Anesthesia Plan Comments:        Anesthesia Quick Evaluation

## 2020-03-29 NOTE — H&P (Signed)
Primary Care Physician:  Seward Carol, MD Primary Gastroenterologist:  Dr. Alessandra Bevels  Reason for Visit : Surveillance colonoscopy  HPI: Ryan Hunt is a 86 y.o. male  Here for repeat colonoscopy. Patient with history of coronary artery disease, diabetes and chronic kidney disease. He underwent EGD and colonoscopy on April 09, 2019 by Dr. Oletta Lamas for evaluation of anemia and heme-positive stool.        Was found to have ulcerated polyp during EGD which was removed with hot snare. Pathology revealed hyperplastic polyp.        Colonoscopy showed poor prep and few tubular adenomas in the ascending colon. Repeat colonoscopy was recommended in 6 months because of inadequate prep.        Repeat colonoscopy in February 2021 by me showed multiple adenomatous polyp ranging in size from 10-20 mm along with one adenomatous polyp with focal high-grade dysplasia. Colonoscopy was very difficult taking more than one hour of time for procedure.        Patient denies any acute GI symptoms at this time. Denied abdominal pain, nausea and vomiting. Denies blood in the stool or black stool. Denies diarrhea or constipation. Denies reflux, dysphagia and odynophagia.  Past Medical History:  Diagnosis Date   Anemia    Anginal pain ([place])    Arthritis    "knees"   Bronchitis 11/2011   "first time for me"   CAD (coronary artery disease)    With DES circumflex 02/2009   CHF (congestive heart failure) (HCC)    Coronary atherosclerosis of native coronary artery    Diabetes mellitus with kidney disease (HCC)    Diastolic heart failure ([place])    Diverticulosis of colon (without mention of hemorrhage)    Dyspnea    Exertional dyspnea    Chronic   GERD (gastroesophageal reflux disease)    History of kidney stones    History of peptic ulcer disease    Remote history   Hypercholesterolemia    Hypertension    Kidney stone    "dr told me my kidney was loaded w/stones; imbedded"   Labyrinthitis     Leg edema    Meralgia paresthetica    Mixed hyperlipidemia    Osteoarthrosis, unspecified whether generalized or localized, lower leg    Palpitations    Prostate cancer ([place])    s/p prostatectomy   Type II diabetes mellitus ([place])    dx 3 yrs ago    Past Surgical History:  Procedure Laterality Date   BACK SURGERY     BIOPSY  04/09/2019   Procedure: BIOPSY;  Surgeon: Laurence Spates, MD;  Location: [facility];  Service: Endoscopy;;   CARDIAC CATHETERIZATION     CATARACT EXTRACTION W/ INTRAOCULAR LENS IMPLANT & ANTERIOR VITRECTOMY, BILATERAL  1990's   COLONOSCOPY     COLONOSCOPY WITH PROPOFOL N/A 04/09/2019   Procedure: COLONOSCOPY WITH PROPOFOL;  Surgeon: Laurence Spates, MD;  Location: [facility];  Service: Endoscopy;  Laterality: N/A;   COLONOSCOPY WITH PROPOFOL N/A 08/31/2019   Procedure: COLONOSCOPY WITH PROPOFOL;  Surgeon: Otis Brace, MD;  Location: [facility];  Service: Gastroenterology;  Laterality: N/A;   CORONARY ANGIOPLASTY WITH STENT PLACEMENT  02/2009   Stent x 1 (DES circumflex)   DIAGNOSTIC LAPAROSCOPY     ESOPHAGOGASTRODUODENOSCOPY (EGD) WITH PROPOFOL N/A 04/09/2019   Procedure: ESOPHAGOGASTRODUODENOSCOPY (EGD) WITH PROPOFOL;  Surgeon: Laurence Spates, MD;  Location: [facility];  Service: Endoscopy;  Laterality: N/A;   EYE SURGERY     bil lens implants   HEMOSTASIS  CLIP PLACEMENT  08/31/2019   Procedure: HEMOSTASIS CLIP PLACEMENT;  Surgeon: Otis Brace, MD;  Location: [facility];  Service: Gastroenterology;;  Ascending Polyp    JOINT REPLACEMENT     s/p left knee   LASIK Bilateral    LEFT HEART CATH AND CORONARY ANGIOGRAPHY N/A 02/27/2018   Procedure: LEFT HEART CATH AND CORONARY ANGIOGRAPHY;  Surgeon: Belva Crome, MD;  Location: [facility];  Service: Cardiovascular;  Laterality: N/A;   LUMBAR LAMINECTOMY/DECOMPRESSION MICRODISCECTOMY N/A 12/23/2014   Procedure:  L2-3 DECOMPRESSION;  Surgeon: Melina Schools, MD;   Location: [facility];  Service: Orthopedics;  Laterality: N/A;   POLYPECTOMY  04/09/2019   Procedure: POLYPECTOMY;  Surgeon: Laurence Spates, MD;  Location: [facility];  Service: Endoscopy;;   POLYPECTOMY  08/31/2019   Procedure: POLYPECTOMY;  Surgeon: Otis Brace, MD;  Location: [facility];  Service: Gastroenterology;;   PROSTATECTOMY  2009   Robotic prostatectomy    Skin Lumps Removed     TOTAL KNEE ARTHROPLASTY  12/31/2011   Procedure: TOTAL KNEE ARTHROPLASTY;  Surgeon: Lorn Junes, MD;  Location: [facility];  Service: Orthopedics;  Laterality: Left;  Left total knee arthroplasty   TOTAL SHOULDER REPLACEMENT  1990's   bilaterally    Prior to Admission medications   Medication Sig Start Date End Date Taking? Authorizing Provider  amLODipine (NORVASC) 10 MG tablet TAKE 1 TABLET DAILY Patient taking differently: Take 10 mg by mouth daily.  03/13/19  Yes Belva Crome, MD  Aromatic Inhalants (VICKS VAPOINHALER IN) Inhale 1 puff into the lungs at bedtime.   Yes [provider]  aspirin EC 81 MG tablet Take 1 tablet (81 mg total) by mouth daily. 11/26/16  Yes Belva Crome, MD  calcium carbonate (OSCAL) 1500 (600 Ca) MG TABS tablet Take 600 mg by mouth at bedtime.   Yes [provider]  docusate sodium (COLACE) 250 MG capsule Take 250 mg by mouth at bedtime.   Yes [provider]  fluorometholone (FLAREX) 0.1 % ophthalmic suspension Place 1 drop into both eyes 3 (three) times daily as needed (eye floaters).   Yes [provider]  furosemide (LASIX) 40 MG tablet Take 40 mg by mouth 2 (two) times daily.    Yes [provider]  glimepiride (AMARYL) 2 MG tablet Take 2-4 mg by mouth See admin instructions. Take 4 mg by mouth in the morning &  2 mg by mouth at night.   Yes [provider]  isosorbide mononitrate (IMDUR) 30 MG 24 hr tablet Take 30 mg by mouth daily.   Yes [provider]  LANTUS SOLOSTAR 100 UNIT/ML Solostar Pen  Inject 25 Units into the skin daily.  01/29/19  Yes [provider]  rosuvastatin (CRESTOR) 10 MG tablet Take 10 mg by mouth daily.   Yes [provider]  telmisartan (MICARDIS) 80 MG tablet Take 1 tablet (80 mg total) by mouth daily. 03/25/18  Yes Tanda Rockers, MD  traMADol (ULTRAM) 50 MG tablet Take 50 mg by mouth 2 (two) times daily as needed for moderate pain (arthritis back pain.).    Yes [provider]  vitamin B-12 (CYANOCOBALAMIN) 1000 MCG tablet Take 1,000 mcg by mouth at bedtime.   Yes [provider]  Vitamin D3 (VITAMIN D) 25 MCG tablet Take 1,000 Units by mouth daily.   Yes [provider]  nitroGLYCERIN (NITROSTAT) 0.4 MG SL tablet Place 0.4 mg under the tongue every 5 (five) minutes as needed for chest  pain.     [provider]  ONE TOUCH ULTRA TEST test strip  07/21/18   [provider]  pantoprazole (PROTONIX) 40 MG tablet Take 1 Tablet 30- 60 min before your first and last meals of the day Patient not taking: Reported on 03/23/2020 08/31/19   Tanda Rockers, MD    Scheduled Meds: Continuous Infusions:  sodium chloride     PRN Meds:.  Allergies as of 01/27/2020 - Review Complete 11/23/2019  Allergen Reaction Noted   Amoxicillin Hives, Rash, and Other (See Comments) 11/12/2011   Oxycodone hcl  02/18/2013   Simvastatin Itching 02/18/2013    Family History  Problem Relation Age of Onset   CVA Father    CAD Father    Prostate cancer Father    Heart disease Father        ASHD   Stroke Father    Nephritis Brother    COPD Brother    CVA Mother    Tremor Mother    Dementia Mother    Heart attack Neg Hx     Social History   Socioeconomic History   Marital status: Widowed    Spouse name: Not on file   Number of children: 1   Years of education: Not on file   Highest education level: Not on file  Occupational History   Occupation: Retired  Tobacco Use   Smoking status: Former  Smoker    Packs/day: 0.50    Years: 15.00    Pack years: 7.50    Types: Cigarettes    Quit date: 07/10/1971    Years since quitting: 48.7   Smokeless tobacco: Never Used  Vaping Use   Vaping Use: Never used  Substance and Sexual Activity   Alcohol use: Yes    Comment: 01/01/12 "occassionall have a glass of beer or wine; not even once a week"   Drug use: No   Sexual activity: Never  Other Topics Concern   Not on file  Social History Narrative   Not on file   Social Determinants of Health   Financial Resource Strain:    Difficulty of Paying Living Expenses: Not on file  Food Insecurity:    Worried About Charity fundraiser in the Last Year: Not on file   [employer] of Food in the Last Year: Not on file  Transportation Needs:    Lack of Transportation (Medical): Not on file   Lack of Transportation (Non-Medical): Not on file  Physical Activity:    Days of Exercise per Week: Not on file   Minutes of Exercise per Session: Not on file  Stress:    Feeling of Stress : Not on file  Social Connections:    Frequency of Communication with Friends and Family: Not on file   Frequency of Social Gatherings with Friends and Family: Not on file   Attends Religious Services: Not on file   Active Member of Clubs or Organizations: Not on file   Attends Archivist Meetings: Not on file   Marital Status: Not on file  Intimate Partner Violence:    Fear of Current or Ex-Partner: Not on file   Emotionally Abused: Not on file   Physically Abused: Not on file   Sexually Abused: Not on file     Physical Exam: Vital signs: Vitals:   03/29/20 1044  BP: (!) 164/52  Pulse: 97  Resp: (!) 25  SpO2: 99%     General:   Alert,  Well-developed, well-nourished,  pleasant and cooperative in NAD Lungs:  Clear throughout to auscultation.   No wheezes, crackles, or rhonchi. No acute distress. Heart:  Regular rate and rhythm; no murmurs, clicks, rubs,  or  gallops. Abdomen: soft, NT, ND, BS +  Rectal:  Deferred  GI:  Lab Results: No results for input(s): WBC, HGB, HCT, PLT in the last 72 hours. BMET No results for input(s): NA, K, CL, CO2, GLUCOSE, BUN, CREATININE, CALCIUM in the last 72 hours. LFT No results for input(s): PROT, ALBUMIN, AST, ALT, ALKPHOS, BILITOT, BILIDIR, IBILI in the last 72 hours. PT/INR No results for input(s): LABPROT, INR in the last 72 hours.   Studies/Results: No results found.  Impression/Plan: -Personal history of adenomatous polyp with high-grade dysplasia -Multiple adenomatous colon polyps  Recommendations ------------------------ -Proceed with colonoscopy today.  Risks (bleeding, infection, bowel perforation that could require surgery, sedation-related changes in cardiopulmonary systems), benefits (identification and possible treatment of source of symptoms, exclusion of certain causes of symptoms), and alternatives (watchful waiting, radiographic imaging studies, empiric medical treatment)  were explained to patient/family in detail and patient wishes to proceed.    LOS: 0 days   Otis Brace  MD, FACP 03/29/2020, 10:48 AM  Contact #  [phone]

## 2020-03-29 NOTE — Op Note (Signed)
[facility] Patient Name: Ryan Hunt Procedure Date: 03/29/2020 MRN: [id number] Attending MD: Otis Brace , MD Date of Birth: [birth year] CSN: [id number] Age: 86 Admit Type: Outpatient Procedure:                Colonoscopy Indications:              Surveillance: Personal history of piecemeal removal                            of adenoma on last colonoscopy (less than 1 year                            ago), High risk colon cancer surveillance: Personal                            history of adenoma with high grade dysplasia, Last                            colonoscopy: February 2021 Providers:                Otis Brace, MD, Wynonia Sours, RN, Elspeth Cho Tech., Technician, Christell Faith, CRNA Referring MD:              Medicines:                 Complications:            No immediate complications. Estimated Blood Loss:     Estimated blood loss was minimal. Procedure:                Pre-Anesthesia Assessment:                           - Prior to the procedure, a History and Physical                            was performed, and patient medications and                            allergies were reviewed. The patient's tolerance of                            previous anesthesia was also reviewed. The risks                            and benefits of the procedure and the sedation                            options and risks were discussed with the patient.                            All questions were answered, and informed consent  was obtained. Prior Anticoagulants: The patient has                            taken no previous anticoagulant or antiplatelet                            agents except for aspirin. ASA Grade Assessment:                            III - A patient with severe systemic disease. After                            reviewing the risks and benefits, the patient was                             deemed in satisfactory condition to undergo the                            procedure.                           After obtaining informed consent, the colonoscope                            was passed under direct vision. Throughout the                            procedure, the patient's blood pressure, pulse, and                            oxygen saturations were monitored continuously. The                            PCF-H190DL ([id number] Olympus pediatric colonscope                            was introduced through the anus and advanced to the                            the cecum, identified by appendiceal orifice and                            ileocecal valve. The colonoscopy was performed with                            moderate difficulty due to significant looping.                            Successful completion of the procedure was aided by                            applying abdominal pressure. The patient tolerated  the procedure well. The quality of the bowel                            preparation was adequate to identify polyps 6 mm                            and larger in size. Scope In: 11:11:57 AM Scope Out: 11:53:12 AM Scope Withdrawal Time: 0 hours 23 minutes 29 seconds  Total Procedure Duration: 0 hours 41 minutes 15 seconds  Findings:      Hemorrhoids were found on perianal exam.      The colon (entire examined portion) revealed moderately excessive       looping. Advancing the scope required applying abdominal pressure.      Two sessile polyps were found in the cecum. The polyps were 3 to 6 mm in       size. These polyps were removed with a cold snare. Resection was       complete, but the polyp tissue was not retrieved.      A medium polyp was found in the ascending colon at previous polypectomy       site with retained clip. The polyp was semi-sessile/inflamatory. The       polyp was removed with a piecemeal technique using a hot snare.        Polypectomy was difficult because of presence of clip. Polyp resection       was incomplete. The resected tissue was retrieved. Fulguration to ablate       the remaining lesion by snare was successful.      Two sessile polyps were found in the transverse colon. The polyps were 4       to 5 mm in size. These polyps were removed with a cold snare. Resection       and retrieval were complete.      Three sessile polyps were found in the descending colon. The polyps were       3 to 7 mm in size. These polyps were removed with a cold snare.       Resection and retrieval were complete.      A 5 mm polyp was found in the sigmoid colon. The polyp was sessile. The       polyp was removed with a cold snare. Resection and retrieval were       complete.      A 5 mm polyp was found in the recto-sigmoid colon. The polyp was       sessile. The polyp was removed with a cold snare. Resection and       retrieval were complete.      Multiple small and large-mouthed diverticula were found in the entire       colon.      Internal hemorrhoids were found during retroflexion. The hemorrhoids       were medium-sized. Impression:               - Hemorrhoids found on perianal exam.                           - There was significant looping of the colon.                           - Two 3 to  6 mm polyps in the cecum, removed with a                            cold snare. Complete resection. Polyp tissue not                            retrieved.                           - One medium polyp in the ascending colon, removed                            piecemeal using a hot snare. Incomplete resection.                            Resected tissue retrieved. Treated with a hot snare.                           - Two 4 to 5 mm polyps in the transverse colon,                            removed with a cold snare. Resected and retrieved.                           - Three 3 to 7 mm polyps in the descending colon,                             removed with a cold snare. Resected and retrieved.                           - One 5 mm polyp in the sigmoid colon, removed with                            a cold snare. Resected and retrieved.                           - One 5 mm polyp at the recto-sigmoid colon,                            removed with a cold snare. Resected and retrieved.                           - Diverticulosis in the entire examined colon.                           - Internal hemorrhoids. Moderate Sedation:      Moderate (conscious) sedation was personally administered by an       anesthesia professional. The following parameters were monitored: oxygen       saturation, heart rate, blood pressure, and response to care. Recommendation:           - Patient has a contact number available for  emergencies. The signs and symptoms of potential                            delayed complications were discussed with the                            patient. Return to normal activities tomorrow.                            Written discharge instructions were provided to the                            patient.                           - Resume previous diet.                           - Continue present medications.                           - Await pathology results.                           - Repeat colonoscopy date to be determined after                            pending pathology results are reviewed for                            surveillance based on pathology results.                           - No aspirin, ibuprofen, naproxen, or other                            non-steroidal anti-inflammatory drugs for 5 days                            after polyp removal.                           - Return to my office PRN. Procedure Code(s):        --- Professional ---                           [phone], Colonoscopy, flexible; with ablation of                            tumor(s), polyp(s), or other  lesion(s) (includes                            pre- and post-dilation and guide wire passage, when                            performed)  60630, 59, Colonoscopy, flexible; with removal of                            tumor(s), polyp(s), or other lesion(s) by snare                            technique Diagnosis Code(s):        --- Professional ---                           K63.5, Polyp of colon                           Z86.010, Personal history of colonic polyps                           K64.8, Other hemorrhoids                           Z09, Encounter for follow-up examination after                            completed treatment for conditions other than                            malignant neoplasm                           K57.30, Diverticulosis of large intestine without                            perforation or abscess without bleeding CPT copyright 2019 American Medical Association. All rights reserved. The codes documented in this report are preliminary and upon coder review may  be revised to meet current compliance requirements. Otis Brace, MD Otis Brace, MD 03/29/2020 12:08:17 PM Number of Addenda: 0

## 2020-03-29 NOTE — Discharge Instructions (Signed)

## 2020-03-29 NOTE — Anesthesia Procedure Notes (Signed)
Procedure Name: MAC Date/Time: 03/29/2020 10:59 AM Performed by: West Pugh, CRNA Pre-anesthesia Checklist: Patient identified, Emergency Drugs available, Suction available, Patient being monitored and Timeout performed Patient Re-evaluated:Patient Re-evaluated prior to induction Oxygen Delivery Method: Simple face mask Preoxygenation: Pre-oxygenation with 100% oxygen Induction Type: IV induction Placement Confirmation: positive ETCO2 Dental Injury: Teeth and Oropharynx as per pre-operative assessment

## 2020-03-29 NOTE — Transfer of Care (Signed)
Immediate Anesthesia Transfer of Care Note  Patient: Ryan Hunt  Procedure(s) Performed: COLONOSCOPY WITH PROPOFOL (N/A ) POLYPECTOMY HEMOSTASIS CONTROL  Patient Location: PACU and Endoscopy Unit  Anesthesia Type:MAC  Level of Consciousness: awake, alert  and patient cooperative  Airway & Oxygen Therapy: Patient Spontanous Breathing and Patient connected to face mask oxygen  Post-op Assessment: Report given to RN and Post -op Vital signs reviewed and stable  Post vital signs: Reviewed and stable  Last Vitals:  Vitals Value Taken Time  BP    Temp    Pulse 54 03/29/20 1200  Resp    SpO2 94 % 03/29/20 1200  Vitals shown include unvalidated device data.  Last Pain:  Vitals:   03/29/20 1044  PainSc: 0-No pain         Complications: No complications documented.

## 2020-03-30 ENCOUNTER — Encounter (HOSPITAL_COMMUNITY): Payer: Self-pay | Admitting: Gastroenterology

## 2020-03-30 LAB — SURGICAL PATHOLOGY

## 2020-03-31 NOTE — Anesthesia Postprocedure Evaluation (Signed)
Anesthesia Post Note  Patient: Ryan Hunt  Procedure(s) Performed: COLONOSCOPY WITH PROPOFOL (N/A ) POLYPECTOMY HEMOSTASIS CONTROL     Patient location during evaluation: PACU Anesthesia Type: MAC Level of consciousness: awake and alert Pain management: pain level controlled Vital Signs Assessment: post-procedure vital signs reviewed and stable Respiratory status: spontaneous breathing Cardiovascular status: stable Anesthetic complications: no   No complications documented.  Last Vitals:  Vitals:   03/29/20 1210 03/29/20 1220  BP: (!) 120/55 (!) 138/50  Pulse: (!) 53 (!) 54  Resp: (!) 21 20  Temp:    SpO2: 97% 95%    Last Pain:  Vitals:   03/30/20 1122  TempSrc:   PainSc: 0-No pain                 Nolon Nations

## 2020-04-01 ENCOUNTER — Ambulatory Visit (INDEPENDENT_AMBULATORY_CARE_PROVIDER_SITE_OTHER): Payer: PPO

## 2020-04-01 ENCOUNTER — Ambulatory Visit (HOSPITAL_COMMUNITY): Payer: PPO | Attending: Cardiology

## 2020-04-01 ENCOUNTER — Other Ambulatory Visit: Payer: Self-pay

## 2020-04-01 DIAGNOSIS — R011 Cardiac murmur, unspecified: Secondary | ICD-10-CM

## 2020-04-01 DIAGNOSIS — R001 Bradycardia, unspecified: Secondary | ICD-10-CM

## 2020-04-01 LAB — ECHOCARDIOGRAM COMPLETE
AR max vel: 2.8 cm2
AV Area VTI: 2.62 cm2
AV Area mean vel: 2.83 cm2
AV Mean grad: 9 mmHg
AV Peak grad: 16.5 mmHg
Ao pk vel: 2.03 m/s
Area-P 1/2: 2.08 cm2
S' Lateral: 3.8 cm

## 2020-04-04 ENCOUNTER — Other Ambulatory Visit: Payer: Self-pay | Admitting: Interventional Cardiology

## 2020-04-07 DIAGNOSIS — R001 Bradycardia, unspecified: Secondary | ICD-10-CM | POA: Diagnosis not present

## 2020-04-11 ENCOUNTER — Telehealth: Payer: Self-pay | Admitting: Interventional Cardiology

## 2020-04-11 NOTE — Telephone Encounter (Signed)
Informed pt of results. Pt verbalized understanding. 

## 2020-04-11 NOTE — Telephone Encounter (Signed)
Patient returned call for results 

## 2020-04-14 DIAGNOSIS — R21 Rash and other nonspecific skin eruption: Secondary | ICD-10-CM | POA: Diagnosis not present

## 2020-05-02 DIAGNOSIS — R21 Rash and other nonspecific skin eruption: Secondary | ICD-10-CM | POA: Diagnosis not present

## 2020-05-06 DIAGNOSIS — E785 Hyperlipidemia, unspecified: Secondary | ICD-10-CM | POA: Diagnosis not present

## 2020-05-06 DIAGNOSIS — E1169 Type 2 diabetes mellitus with other specified complication: Secondary | ICD-10-CM | POA: Diagnosis not present

## 2020-05-06 DIAGNOSIS — F4321 Adjustment disorder with depressed mood: Secondary | ICD-10-CM | POA: Diagnosis not present

## 2020-05-06 DIAGNOSIS — I2510 Atherosclerotic heart disease of native coronary artery without angina pectoris: Secondary | ICD-10-CM | POA: Diagnosis not present

## 2020-05-06 DIAGNOSIS — I10 Essential (primary) hypertension: Secondary | ICD-10-CM | POA: Diagnosis not present

## 2020-05-06 DIAGNOSIS — C61 Malignant neoplasm of prostate: Secondary | ICD-10-CM | POA: Diagnosis not present

## 2020-05-06 DIAGNOSIS — E1122 Type 2 diabetes mellitus with diabetic chronic kidney disease: Secondary | ICD-10-CM | POA: Diagnosis not present

## 2020-05-06 DIAGNOSIS — E1165 Type 2 diabetes mellitus with hyperglycemia: Secondary | ICD-10-CM | POA: Diagnosis not present

## 2020-05-06 DIAGNOSIS — I5030 Unspecified diastolic (congestive) heart failure: Secondary | ICD-10-CM | POA: Diagnosis not present

## 2020-05-06 DIAGNOSIS — D509 Iron deficiency anemia, unspecified: Secondary | ICD-10-CM | POA: Diagnosis not present

## 2020-05-06 DIAGNOSIS — N183 Chronic kidney disease, stage 3 unspecified: Secondary | ICD-10-CM | POA: Diagnosis not present

## 2020-05-06 DIAGNOSIS — E7800 Pure hypercholesterolemia, unspecified: Secondary | ICD-10-CM | POA: Diagnosis not present

## 2020-05-17 DIAGNOSIS — H903 Sensorineural hearing loss, bilateral: Secondary | ICD-10-CM | POA: Diagnosis not present

## 2020-06-07 DIAGNOSIS — I5030 Unspecified diastolic (congestive) heart failure: Secondary | ICD-10-CM | POA: Diagnosis not present

## 2020-06-07 DIAGNOSIS — F4321 Adjustment disorder with depressed mood: Secondary | ICD-10-CM | POA: Diagnosis not present

## 2020-06-07 DIAGNOSIS — I10 Essential (primary) hypertension: Secondary | ICD-10-CM | POA: Diagnosis not present

## 2020-06-07 DIAGNOSIS — D509 Iron deficiency anemia, unspecified: Secondary | ICD-10-CM | POA: Diagnosis not present

## 2020-06-07 DIAGNOSIS — N183 Chronic kidney disease, stage 3 unspecified: Secondary | ICD-10-CM | POA: Diagnosis not present

## 2020-06-07 DIAGNOSIS — D500 Iron deficiency anemia secondary to blood loss (chronic): Secondary | ICD-10-CM | POA: Diagnosis not present

## 2020-06-07 DIAGNOSIS — E1122 Type 2 diabetes mellitus with diabetic chronic kidney disease: Secondary | ICD-10-CM | POA: Diagnosis not present

## 2020-06-07 DIAGNOSIS — E785 Hyperlipidemia, unspecified: Secondary | ICD-10-CM | POA: Diagnosis not present

## 2020-06-07 DIAGNOSIS — E1169 Type 2 diabetes mellitus with other specified complication: Secondary | ICD-10-CM | POA: Diagnosis not present

## 2020-06-07 DIAGNOSIS — I2510 Atherosclerotic heart disease of native coronary artery without angina pectoris: Secondary | ICD-10-CM | POA: Diagnosis not present

## 2020-06-07 DIAGNOSIS — E1165 Type 2 diabetes mellitus with hyperglycemia: Secondary | ICD-10-CM | POA: Diagnosis not present

## 2020-06-07 DIAGNOSIS — C61 Malignant neoplasm of prostate: Secondary | ICD-10-CM | POA: Diagnosis not present

## 2020-06-21 ENCOUNTER — Other Ambulatory Visit: Payer: Self-pay | Admitting: Internal Medicine

## 2020-06-21 ENCOUNTER — Ambulatory Visit
Admission: RE | Admit: 2020-06-21 | Discharge: 2020-06-21 | Disposition: A | Discharge status: Home / Self Care | Payer: PPO | Source: Ambulatory Visit | Attending: Internal Medicine | Admitting: Internal Medicine

## 2020-06-21 DIAGNOSIS — I739 Peripheral vascular disease, unspecified: Secondary | ICD-10-CM | POA: Diagnosis not present

## 2020-06-21 DIAGNOSIS — M545 Low back pain, unspecified: Secondary | ICD-10-CM | POA: Diagnosis not present

## 2020-06-21 DIAGNOSIS — M25559 Pain in unspecified hip: Secondary | ICD-10-CM

## 2020-07-04 DIAGNOSIS — M65342 Trigger finger, left ring finger: Secondary | ICD-10-CM | POA: Diagnosis not present

## 2020-07-04 DIAGNOSIS — M65331 Trigger finger, right middle finger: Secondary | ICD-10-CM | POA: Diagnosis not present

## 2020-07-06 DIAGNOSIS — K219 Gastro-esophageal reflux disease without esophagitis: Secondary | ICD-10-CM | POA: Diagnosis not present

## 2020-07-06 DIAGNOSIS — E1169 Type 2 diabetes mellitus with other specified complication: Secondary | ICD-10-CM | POA: Diagnosis not present

## 2020-07-06 DIAGNOSIS — E785 Hyperlipidemia, unspecified: Secondary | ICD-10-CM | POA: Diagnosis not present

## 2020-07-06 DIAGNOSIS — E1122 Type 2 diabetes mellitus with diabetic chronic kidney disease: Secondary | ICD-10-CM | POA: Diagnosis not present

## 2020-07-06 DIAGNOSIS — C61 Malignant neoplasm of prostate: Secondary | ICD-10-CM | POA: Diagnosis not present

## 2020-07-06 DIAGNOSIS — N183 Chronic kidney disease, stage 3 unspecified: Secondary | ICD-10-CM | POA: Diagnosis not present

## 2020-07-06 DIAGNOSIS — D509 Iron deficiency anemia, unspecified: Secondary | ICD-10-CM | POA: Diagnosis not present

## 2020-07-06 DIAGNOSIS — E7800 Pure hypercholesterolemia, unspecified: Secondary | ICD-10-CM | POA: Diagnosis not present

## 2020-07-06 DIAGNOSIS — I5030 Unspecified diastolic (congestive) heart failure: Secondary | ICD-10-CM | POA: Diagnosis not present

## 2020-07-06 DIAGNOSIS — D500 Iron deficiency anemia secondary to blood loss (chronic): Secondary | ICD-10-CM | POA: Diagnosis not present

## 2020-07-06 DIAGNOSIS — E1165 Type 2 diabetes mellitus with hyperglycemia: Secondary | ICD-10-CM | POA: Diagnosis not present

## 2020-07-06 DIAGNOSIS — I10 Essential (primary) hypertension: Secondary | ICD-10-CM | POA: Diagnosis not present

## 2020-08-02 DIAGNOSIS — I2510 Atherosclerotic heart disease of native coronary artery without angina pectoris: Secondary | ICD-10-CM | POA: Diagnosis not present

## 2020-08-02 DIAGNOSIS — E1122 Type 2 diabetes mellitus with diabetic chronic kidney disease: Secondary | ICD-10-CM | POA: Diagnosis not present

## 2020-08-02 DIAGNOSIS — I447 Left bundle-branch block, unspecified: Secondary | ICD-10-CM | POA: Diagnosis not present

## 2020-08-02 DIAGNOSIS — Z794 Long term (current) use of insulin: Secondary | ICD-10-CM | POA: Diagnosis not present

## 2020-08-02 DIAGNOSIS — N1832 Chronic kidney disease, stage 3b: Secondary | ICD-10-CM | POA: Diagnosis not present

## 2020-08-02 DIAGNOSIS — I10 Essential (primary) hypertension: Secondary | ICD-10-CM | POA: Diagnosis not present

## 2020-08-02 DIAGNOSIS — E7800 Pure hypercholesterolemia, unspecified: Secondary | ICD-10-CM | POA: Diagnosis not present

## 2020-09-05 DIAGNOSIS — I10 Essential (primary) hypertension: Secondary | ICD-10-CM | POA: Diagnosis not present

## 2020-09-05 DIAGNOSIS — N183 Chronic kidney disease, stage 3 unspecified: Secondary | ICD-10-CM | POA: Diagnosis not present

## 2020-09-05 DIAGNOSIS — E785 Hyperlipidemia, unspecified: Secondary | ICD-10-CM | POA: Diagnosis not present

## 2020-09-05 DIAGNOSIS — D500 Iron deficiency anemia secondary to blood loss (chronic): Secondary | ICD-10-CM | POA: Diagnosis not present

## 2020-09-05 DIAGNOSIS — Z8546 Personal history of malignant neoplasm of prostate: Secondary | ICD-10-CM | POA: Diagnosis not present

## 2020-09-05 DIAGNOSIS — I5030 Unspecified diastolic (congestive) heart failure: Secondary | ICD-10-CM | POA: Diagnosis not present

## 2020-09-05 DIAGNOSIS — E1165 Type 2 diabetes mellitus with hyperglycemia: Secondary | ICD-10-CM | POA: Diagnosis not present

## 2020-09-05 DIAGNOSIS — N1832 Chronic kidney disease, stage 3b: Secondary | ICD-10-CM | POA: Diagnosis not present

## 2020-09-05 DIAGNOSIS — I2510 Atherosclerotic heart disease of native coronary artery without angina pectoris: Secondary | ICD-10-CM | POA: Diagnosis not present

## 2020-09-05 DIAGNOSIS — C61 Malignant neoplasm of prostate: Secondary | ICD-10-CM | POA: Diagnosis not present

## 2020-09-05 DIAGNOSIS — K219 Gastro-esophageal reflux disease without esophagitis: Secondary | ICD-10-CM | POA: Diagnosis not present

## 2020-10-03 DIAGNOSIS — E1165 Type 2 diabetes mellitus with hyperglycemia: Secondary | ICD-10-CM | POA: Diagnosis not present

## 2020-10-03 DIAGNOSIS — I10 Essential (primary) hypertension: Secondary | ICD-10-CM | POA: Diagnosis not present

## 2020-10-03 DIAGNOSIS — N1832 Chronic kidney disease, stage 3b: Secondary | ICD-10-CM | POA: Diagnosis not present

## 2020-10-03 DIAGNOSIS — E1122 Type 2 diabetes mellitus with diabetic chronic kidney disease: Secondary | ICD-10-CM | POA: Diagnosis not present

## 2020-10-03 DIAGNOSIS — E1169 Type 2 diabetes mellitus with other specified complication: Secondary | ICD-10-CM | POA: Diagnosis not present

## 2020-10-03 DIAGNOSIS — I2510 Atherosclerotic heart disease of native coronary artery without angina pectoris: Secondary | ICD-10-CM | POA: Diagnosis not present

## 2020-10-03 DIAGNOSIS — K219 Gastro-esophageal reflux disease without esophagitis: Secondary | ICD-10-CM | POA: Diagnosis not present

## 2020-10-03 DIAGNOSIS — I5030 Unspecified diastolic (congestive) heart failure: Secondary | ICD-10-CM | POA: Diagnosis not present

## 2020-10-03 DIAGNOSIS — E785 Hyperlipidemia, unspecified: Secondary | ICD-10-CM | POA: Diagnosis not present

## 2020-10-03 DIAGNOSIS — F4321 Adjustment disorder with depressed mood: Secondary | ICD-10-CM | POA: Diagnosis not present

## 2020-10-03 DIAGNOSIS — D500 Iron deficiency anemia secondary to blood loss (chronic): Secondary | ICD-10-CM | POA: Diagnosis not present

## 2020-11-01 DIAGNOSIS — I5030 Unspecified diastolic (congestive) heart failure: Secondary | ICD-10-CM | POA: Diagnosis not present

## 2020-11-01 DIAGNOSIS — E785 Hyperlipidemia, unspecified: Secondary | ICD-10-CM | POA: Diagnosis not present

## 2020-11-01 DIAGNOSIS — N183 Chronic kidney disease, stage 3 unspecified: Secondary | ICD-10-CM | POA: Diagnosis not present

## 2020-11-01 DIAGNOSIS — K219 Gastro-esophageal reflux disease without esophagitis: Secondary | ICD-10-CM | POA: Diagnosis not present

## 2020-11-01 DIAGNOSIS — I10 Essential (primary) hypertension: Secondary | ICD-10-CM | POA: Diagnosis not present

## 2020-11-01 DIAGNOSIS — E1122 Type 2 diabetes mellitus with diabetic chronic kidney disease: Secondary | ICD-10-CM | POA: Diagnosis not present

## 2020-11-01 DIAGNOSIS — E7800 Pure hypercholesterolemia, unspecified: Secondary | ICD-10-CM | POA: Diagnosis not present

## 2020-11-01 DIAGNOSIS — D509 Iron deficiency anemia, unspecified: Secondary | ICD-10-CM | POA: Diagnosis not present

## 2020-11-01 DIAGNOSIS — I2510 Atherosclerotic heart disease of native coronary artery without angina pectoris: Secondary | ICD-10-CM | POA: Diagnosis not present

## 2020-11-01 DIAGNOSIS — Z8546 Personal history of malignant neoplasm of prostate: Secondary | ICD-10-CM | POA: Diagnosis not present

## 2020-11-01 DIAGNOSIS — E1165 Type 2 diabetes mellitus with hyperglycemia: Secondary | ICD-10-CM | POA: Diagnosis not present

## 2020-11-01 DIAGNOSIS — C61 Malignant neoplasm of prostate: Secondary | ICD-10-CM | POA: Diagnosis not present

## 2020-11-14 DIAGNOSIS — L539 Erythematous condition, unspecified: Secondary | ICD-10-CM | POA: Diagnosis not present

## 2020-11-14 DIAGNOSIS — W57XXXA Bitten or stung by nonvenomous insect and other nonvenomous arthropods, initial encounter: Secondary | ICD-10-CM | POA: Diagnosis not present

## 2020-11-17 DIAGNOSIS — N1832 Chronic kidney disease, stage 3b: Secondary | ICD-10-CM | POA: Diagnosis not present

## 2020-11-17 DIAGNOSIS — E1165 Type 2 diabetes mellitus with hyperglycemia: Secondary | ICD-10-CM | POA: Diagnosis not present

## 2020-11-17 DIAGNOSIS — E1122 Type 2 diabetes mellitus with diabetic chronic kidney disease: Secondary | ICD-10-CM | POA: Diagnosis not present

## 2020-11-17 DIAGNOSIS — K219 Gastro-esophageal reflux disease without esophagitis: Secondary | ICD-10-CM | POA: Diagnosis not present

## 2020-11-17 DIAGNOSIS — F4321 Adjustment disorder with depressed mood: Secondary | ICD-10-CM | POA: Diagnosis not present

## 2020-11-17 DIAGNOSIS — I10 Essential (primary) hypertension: Secondary | ICD-10-CM | POA: Diagnosis not present

## 2020-11-17 DIAGNOSIS — I5030 Unspecified diastolic (congestive) heart failure: Secondary | ICD-10-CM | POA: Diagnosis not present

## 2020-11-17 DIAGNOSIS — E1169 Type 2 diabetes mellitus with other specified complication: Secondary | ICD-10-CM | POA: Diagnosis not present

## 2020-11-17 DIAGNOSIS — E785 Hyperlipidemia, unspecified: Secondary | ICD-10-CM | POA: Diagnosis not present

## 2020-11-17 DIAGNOSIS — I2510 Atherosclerotic heart disease of native coronary artery without angina pectoris: Secondary | ICD-10-CM | POA: Diagnosis not present

## 2020-12-26 DIAGNOSIS — M65342 Trigger finger, left ring finger: Secondary | ICD-10-CM | POA: Diagnosis not present

## 2021-01-02 DIAGNOSIS — Z8546 Personal history of malignant neoplasm of prostate: Secondary | ICD-10-CM | POA: Diagnosis not present

## 2021-01-02 DIAGNOSIS — E785 Hyperlipidemia, unspecified: Secondary | ICD-10-CM | POA: Diagnosis not present

## 2021-01-02 DIAGNOSIS — I5030 Unspecified diastolic (congestive) heart failure: Secondary | ICD-10-CM | POA: Diagnosis not present

## 2021-01-02 DIAGNOSIS — E7800 Pure hypercholesterolemia, unspecified: Secondary | ICD-10-CM | POA: Diagnosis not present

## 2021-01-02 DIAGNOSIS — N183 Chronic kidney disease, stage 3 unspecified: Secondary | ICD-10-CM | POA: Diagnosis not present

## 2021-01-02 DIAGNOSIS — D509 Iron deficiency anemia, unspecified: Secondary | ICD-10-CM | POA: Diagnosis not present

## 2021-01-02 DIAGNOSIS — I10 Essential (primary) hypertension: Secondary | ICD-10-CM | POA: Diagnosis not present

## 2021-01-02 DIAGNOSIS — I2510 Atherosclerotic heart disease of native coronary artery without angina pectoris: Secondary | ICD-10-CM | POA: Diagnosis not present

## 2021-01-02 DIAGNOSIS — E1165 Type 2 diabetes mellitus with hyperglycemia: Secondary | ICD-10-CM | POA: Diagnosis not present

## 2021-01-02 DIAGNOSIS — E1122 Type 2 diabetes mellitus with diabetic chronic kidney disease: Secondary | ICD-10-CM | POA: Diagnosis not present

## 2021-01-02 DIAGNOSIS — C61 Malignant neoplasm of prostate: Secondary | ICD-10-CM | POA: Diagnosis not present

## 2021-01-02 DIAGNOSIS — K219 Gastro-esophageal reflux disease without esophagitis: Secondary | ICD-10-CM | POA: Diagnosis not present

## 2021-01-04 DIAGNOSIS — M79642 Pain in left hand: Secondary | ICD-10-CM | POA: Diagnosis not present

## 2021-01-04 DIAGNOSIS — M65342 Trigger finger, left ring finger: Secondary | ICD-10-CM | POA: Diagnosis not present

## 2021-01-05 ENCOUNTER — Telehealth: Payer: Self-pay | Admitting: *Deleted

## 2021-01-05 ENCOUNTER — Other Ambulatory Visit: Payer: Self-pay | Admitting: Orthopedic Surgery

## 2021-01-05 NOTE — Telephone Encounter (Signed)
Left VM

## 2021-01-05 NOTE — Telephone Encounter (Signed)
   [facility] Pre-operative Risk Assessment    Patient Name: Ryan Hunt  DOB: 1931-04-10  MRN: [id number]   HEARTCARE STAFF: - Please ensure there is not already an duplicate clearance open for this procedure. - Under Visit Info/Reason for Call, type in Other and utilize the format Clearance MM/DD/YY or Clearance TBD. Do not use dashes or single digits. - If request is for dental extraction, please clarify the # of teeth to be extracted. - If the patient is currently at the dentist's office, call Pre-Op APP to address. If the patient is not currently in the dentist office, please route to the Pre-Op pool  Request for surgical clearance:  What type of surgery is being performed? RELEASE A-1 PULLEY LEFT RING FINGER   When is this surgery scheduled? TBD   What type of clearance is required (medical clearance vs. Pharmacy clearance to hold med vs. Both)? MEDICAL  Are there any medications that need to be held prior to surgery and how long? ASA    Practice name and name of physician performing surgery? [facility]; DR. Daryll Brod   What is the office phone number? [phone]   7.   What is the office fax number? [place].   Anesthesia type (None, local, MAC, general) ? IV REGIONAL FOREARM BLOCK   Julaine Hua 01/05/2021, 2:33 PM  _________________________________________________________________   (provider comments below)

## 2021-01-10 NOTE — Telephone Encounter (Signed)
Left message for pt to call back so that we may schedule an appt for pre op clearance. I did not see any openings at [facility]. Location. I was going to offer the pt an appt at the [place], location for his pre op appt.

## 2021-01-10 NOTE — Telephone Encounter (Signed)
Primary Cardiologist:Henry Nicholes Stairs III, MD  Chart reviewed as part of pre-operative protocol coverage. Because of Kamarii Carton Harrington's past medical history and time since last visit, he/she will require a follow-up visit in order to better assess preoperative cardiovascular risk.  Pre-op covering staff: - Please schedule appointment and call patient to inform them. - Please contact requesting surgeon's office via preferred method (i.e, phone, fax) to inform them of need for appointment prior to surgery.  If applicable, this message will also be routed to pharmacy pool and/or primary cardiologist for input on holding anticoagulant/antiplatelet agent as requested below so that this information is available at time of patient's appointment.   Deberah Pelton, NP  01/10/2021, 9:26 AM

## 2021-01-10 NOTE — Telephone Encounter (Signed)
Tried to reach the pt to schedule a pre op appt. Line was busy, will try again later.

## 2021-01-11 NOTE — Telephone Encounter (Signed)
Pt is scheduled to see Terie Purser, NP, 01/30/2021.  Will route back to the requesting surgeon's office to make them aware surgical clearance will be addressed at that visit.

## 2021-01-25 ENCOUNTER — Ambulatory Visit (HOSPITAL_BASED_OUTPATIENT_CLINIC_OR_DEPARTMENT_OTHER): Payer: PPO | Admitting: Family

## 2021-01-30 ENCOUNTER — Other Ambulatory Visit: Payer: Self-pay

## 2021-01-30 ENCOUNTER — Ambulatory Visit (HOSPITAL_BASED_OUTPATIENT_CLINIC_OR_DEPARTMENT_OTHER): Payer: PPO | Admitting: Family

## 2021-01-30 ENCOUNTER — Encounter (HOSPITAL_BASED_OUTPATIENT_CLINIC_OR_DEPARTMENT_OTHER): Payer: Self-pay | Admitting: Family

## 2021-01-30 VITALS — BP 112/54 | HR 52 | Ht 68.0 in | Wt 207.0 lb

## 2021-01-30 DIAGNOSIS — I2510 Atherosclerotic heart disease of native coronary artery without angina pectoris: Secondary | ICD-10-CM | POA: Diagnosis not present

## 2021-01-30 DIAGNOSIS — Z01810 Encounter for preprocedural cardiovascular examination: Secondary | ICD-10-CM

## 2021-01-30 DIAGNOSIS — I447 Left bundle-branch block, unspecified: Secondary | ICD-10-CM | POA: Diagnosis not present

## 2021-01-30 DIAGNOSIS — R001 Bradycardia, unspecified: Secondary | ICD-10-CM

## 2021-01-30 NOTE — Patient Instructions (Addendum)
Medication Instructions:  Continue your current medications.   *If you need a refill on your cardiac medications before your next appointment, please call your pharmacy*  Lab Work: None ordered today.   Testing/Procedures: Your EKG today was stable compared to previous. It showed a slow but regular heart beat which is a stable finding.   Follow-Up: At [facility], you and your health needs are our priority.  As part of our continuing mission to provide you with exceptional heart care, we have created designated Provider Care Teams.  These Care Teams include your primary Cardiologist (physician) and Advanced Practice Providers (APPs -  Physician Assistants and Nurse Practitioners) who all work together to provide you with the care you need, when you need it.  We recommend signing up for the patient portal called "MyChart".  Sign up information is provided on this After Visit Summary.  MyChart is used to connect with patients for Virtual Visits (Telemedicine).  Patients are able to view lab/test results, encounter notes, upcoming appointments, etc.  Non-urgent messages can be sent to your provider as well.   To learn more about what you can do with MyChart, go to [email or web address].    Your next appointment:   In October as scheduled with Dr. Tamala Julian  Other Instructions  Loel Dubonnet, NP will send a note to Dr. Fredna Dow that you are cleared for your planned procedure.   Heart Healthy Diet Recommendations: A low-salt diet is recommended. Meats should be grilled, baked, or boiled. Avoid fried foods. Focus on lean protein sources like fish or chicken with vegetables and fruits. The American Heart Association is a Microbiologist!    Exercise recommendations: The American Heart Association recommends 150 minutes of moderate intensity exercise weekly. Try 30 minutes of moderate intensity exercise 4-5 times per week. This could include walking, jogging, or swimming.

## 2021-01-30 NOTE — Progress Notes (Signed)
Office Visit    Patient Name: Ryan Hunt Date of Encounter: 01/30/2021  PCP:  Seward Carol, MD   [facility]  Cardiologist:  Sinclair Grooms, MD  Advanced Practice Provider:  No care team member to display Electrophysiologist:  None   Chief Complaint    Ryan Hunt is a 87 y.o. 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[place])    Diastolic heart failure ([place])    Diverticulosis of colon (without mention of hemorrhage)    Dyspnea    Exertional dyspnea    Chronic   GERD (gastroesophageal reflux disease)    History of kidney stones    History of peptic ulcer disease    Remote history   Hypercholesterolemia    Hypertension    Kidney stone    "dr told me my kidney was loaded w/stones; imbedded"   Labyrinthitis    Leg edema    Meralgia paresthetica    Mixed hyperlipidemia    Osteoarthrosis, unspecified whether generalized or localized, lower leg    Palpitations    Prostate cancer ([place])    s/p prostatectomy   Type II diabetes mellitus ([place])    dx 3 yrs ago   Past Surgical History:  Procedure Laterality Date   BACK SURGERY     BIOPSY  04/09/2019   Procedure: BIOPSY;  Surgeon: Laurence Spates, MD;  Location: [facility];  Service: Endoscopy;;   CARDIAC CATHETERIZATION     CATARACT EXTRACTION W/ INTRAOCULAR LENS IMPLANT & ANTERIOR VITRECTOMY, BILATERAL  1990's   COLONOSCOPY     COLONOSCOPY WITH PROPOFOL N/A 04/09/2019   Procedure: COLONOSCOPY WITH PROPOFOL;   Surgeon: Laurence Spates, MD;  [facility]: [facility];  Service: Endoscopy;  Laterality: N/A;   COLONOSCOPY WITH PROPOFOL N/A 08/31/2019   Procedure: COLONOSCOPY WITH PROPOFOL;  Surgeon: Otis Brace, MD;  Location: [facility];  Service: Gastroenterology;  Laterality: N/A;   COLONOSCOPY WITH PROPOFOL N/A 03/29/2020   Procedure: COLONOSCOPY WITH PROPOFOL;  Surgeon: Otis Brace, MD;  [facility];  Service: Gastroenterology;  Laterality: N/A;   CORONARY ANGIOPLASTY WITH STENT PLACEMENT  02/2009   Stent x 1 (DES circumflex)   DIAGNOSTIC LAPAROSCOPY     ESOPHAGOGASTRODUODENOSCOPY (EGD) WITH PROPOFOL N/A 04/09/2019   Procedure: ESOPHAGOGASTRODUODENOSCOPY (EGD) WITH PROPOFOL;  Surgeon: Laurence Spates, MD;  [facility];  Service: Endoscopy;  Laterality: N/A;   EYE SURGERY     bil lens implants   HEMOSTASIS CLIP PLACEMENT  08/31/2019   Procedure: HEMOSTASIS CLIP PLACEMENT;  Surgeon: Otis Brace, MD;  [facility];  Service: Gastroenterology;;  Ascending Polyp    HEMOSTASIS CONTROL  03/29/2020   Procedure: HEMOSTASIS CONTROL;  Surgeon: Otis Brace, MD;  [facility]: [facility];  Service: Gastroenterology;;   JOINT REPLACEMENT     s/p left knee   LASIK Bilateral    LEFT HEART CATH AND CORONARY ANGIOGRAPHY N/A 02/27/2018   Procedure: LEFT HEART CATH AND CORONARY ANGIOGRAPHY;  Surgeon: Belva Crome, MD;  Location: [facility];  Service: Cardiovascular;  Laterality: N/A;   LUMBAR LAMINECTOMY/DECOMPRESSION MICRODISCECTOMY N/A 12/23/2014   Procedure:  L2-3 DECOMPRESSION;  Surgeon: Melina Schools, MD;  Location: [facility];  Service: Orthopedics;  Laterality: N/A;   POLYPECTOMY  04/09/2019   Procedure: POLYPECTOMY;  Surgeon: Laurence Spates, MD;  [facility];  Service: Endoscopy;;   POLYPECTOMY  08/31/2019   Procedure: POLYPECTOMY;  Surgeon: Otis Brace, MD;  [facility];  Service: Gastroenterology;;   POLYPECTOMY  03/29/2020    Procedure: POLYPECTOMY;  Surgeon: Otis Brace, MD;  [facility];  Service: Gastroenterology;;   PROSTATECTOMY  2009   Robotic prostatectomy    Skin Lumps Removed     TOTAL KNEE ARTHROPLASTY  12/31/2011   Procedure: TOTAL KNEE ARTHROPLASTY;  Surgeon: Lorn Junes, MD;  Location: [place] [facility];  Service: Orthopedics;  Laterality: Left;  Left total knee arthroplasty   TOTAL SHOULDER REPLACEMENT  1990's   bilaterally    Allergies  Allergies  Allergen Reactions   Amoxicillin Hives, Rash and Other (See Comments)    "sores in my mouth" Did it involve swelling of the face/tongue/throat, SOB, or low BP? Yes Did it involve sudden or severe rash/hives, skin peeling, or any reaction on the inside of your mouth or nose? Yes Did you need to seek medical attention at a hospital or doctor's office? Unknown When did it last happen?at least 10 years ago If all above answers are "NO", may proceed with cephalosporin use.    Oxycodone Hcl     agitation   Simvastatin Itching    History of Present Illness    Ryan Hunt is a 87 y.o. male with a hx of CAD s/p circumflex stent 3762, diastolic heart failure, chronic dyspnea on exertion, bradycardia, murmur, left bundle branch block, DM2, hyperlipidemia  last seen 03/2020 by Dr. Tamala Julian.  He was last seen 03/17/2020 by Dr. Tamala Julian.  He was feeling overall well from a cardiac perspective.  He did note that his heart rate at home had been in the 40s to 50s.  He was recommended for 48-hour Holter monitor to assess for high-grade AV block or excessive pause.  He was not noted to be on any AV nodal blocking agents.  He had murmur on exam and was recommended for echocardiogram.  Monitor showed predominantly normal sinus rhythm with sinus bradycardia without high-grade AV block or heart rate less than 40 bpm. Echocardiogram 03/2020 showed LVEF 65-70%, moderate concentric LVH, grade 1 diastolic dysfunction, RV normal size and function, normal PASP, LA mildly  dilated, mild aortic valve sclerosis without stenosis, mild MR.  He presents today for preoperative clearance for release A-1 pulley left ring finger with Dr. Fredna Dow.  He tells me his wife passed three years ago. This has been understandably difficult. He has adopted a cat which he tells me has brought him some good company. Reports no chest pain, pressure, tightness. Tells me he has had dyspnea on exertion which is stable at his baseline. He ambulates with a cane. He reports no lightheadedness nor dizziness. No near syncope nor syncope. Recently went on a bus trip to the [place] with his church which involved lots of walking. He stays active and still mows his 2.5 acrs with a riding mower.  EKGs/Labs/Other Studies Reviewed:   The following studies were reviewed today:  Monitor 04/2020 Study Highlights Basic rhythm is NSR and SB Intermittent sinus with BBB pattern, possible intermittent BBB vs  AIVR Longest run of BBB vs AIVR rhythm 8 beats. No symptoms reported. No sustained arrhythmia   Sinus and sinus brady without high grade AV block or HR < 40 BPM. Occasional WCT salvos up to eight beats. No symptoms Benign study.  Echo 03/2020 1. Left ventricular ejection fraction, by estimation, is 65 to 70%. The  left ventricle has normal function. There is moderate concentric left  ventricular hypertrophy. Left ventricular diastolic parameters are  consistent with Grade I diastolic  dysfunction (impaired relaxation). Narrow LV outflow tract with peak  gradient 22 mmHg. No systolic anterior motion of the mitral valve.   2. Right ventricular systolic function is normal. The right ventricular  size is normal. There is normal pulmonary artery systolic pressure. The  estimated right ventricular systolic pressure is 66.4 mmHg.   3. Left atrial size was mildly dilated.   4. The aortic valve is tricuspid. Aortic valve regurgitation is not  visualized. Mild aortic valve sclerosis is present, with no  evidence of  aortic valve stenosis.   5. The mitral valve is normal in structure. Mild mitral valve  regurgitation. No evidence of mitral stenosis.   6. The inferior vena cava is normal in size with greater than 50%  respiratory variability, suggesting right atrial pressure of 3 mmHg.   EKG:  EKG is  ordered today.  The ekg ordered today demonstrates sinus bradycardia 52 bpm with first degree AV block PR 244 ms with PAC and PVC.  Recent Labs: No results found for requested labs within last 8760 hours.  Recent Lipid Panel    Component Value Date/Time   CHOL 142 02/27/2018 0702   TRIG 75 02/27/2018 0702   HDL 44 02/27/2018 0702   CHOLHDL 3.2 02/27/2018 0702   VLDL 15 02/27/2018 0702   LDLCALC 83 02/27/2018 0702    Home Medications   Current Meds  Medication Sig   amLODipine (NORVASC) 10 MG tablet Take 1 tablet (10 mg total) by mouth daily.   Aromatic Inhalants (VICKS VAPOINHALER IN) Inhale 1 puff into the lungs at bedtime.   aspirin EC 81 MG tablet Take 1 tablet (81 mg total) by mouth daily.   calcium carbonate (OSCAL) 1500 (600 Ca) MG TABS tablet Take 600 mg by mouth at bedtime.   docusate sodium (COLACE) 250 MG capsule Take 250 mg by mouth at bedtime.   fluorometholone (FLAREX) 0.1 % ophthalmic suspension Place 1 drop into both eyes 3 (three) times daily as needed (eye floaters).   furosemide (LASIX) 40 MG tablet Take 40 mg by mouth 2 (two) times daily.    isosorbide mononitrate (IMDUR) 30 MG 24 hr tablet Take 30 mg by mouth daily.   LANTUS SOLOSTAR 100 UNIT/ML Solostar Pen Inject 25 Units into the skin daily.    nitroGLYCERIN (NITROSTAT) 0.4 MG SL tablet Place 0.4 mg under the tongue every 5 (five) minutes as needed for chest pain.    ONE TOUCH ULTRA TEST test strip    pantoprazole (PROTONIX) 40 MG tablet Take 1 Tablet 30- 60 min before your first and last meals of the day   rosuvastatin (CRESTOR) 10 MG tablet Take 10 mg by mouth daily.   telmisartan (MICARDIS) 80 MG tablet  Take 1 tablet (80 mg total) by mouth daily.   traMADol (ULTRAM) 50 MG tablet Take 50 mg by mouth 2 (two) times daily as needed for moderate pain (arthritis back pain.).    vitamin B-12 (CYANOCOBALAMIN) 1000 MCG tablet Take 1,000 mcg by mouth at bedtime.  Vitamin D3 (VITAMIN D) 25 MCG tablet Take 1,000 Units by mouth daily.     Review of Systems      All other systems reviewed and are otherwise negative except as noted above.  Physical Exam    VS:  BP (!) 112/54   Pulse (!) 52   Ht 5\' 8"  (1.727 m)   Wt 207 lb (93.9 kg)   BMI 31.47 kg/m  , BMI Body mass index is 31.47 kg/m.  Wt Readings from Last 3 Encounters:  01/30/21 207 lb (93.9 kg)  03/29/20 219 lb 12.8 oz (99.7 kg)  03/17/20 219 lb 12.8 oz (99.7 kg)     GEN: Well nourished, well developed, in no acute distress. HEENT: normal. Neck: Supple, no JVD, carotid bruits, or masses. Cardiac: bradycardia, RRR, no murmurs, rubs, or gallops. No clubbing, cyanosis, edema.  Radials/PT 2+ and equal bilaterally.  Respiratory:  Respirations regular and unlabored, clear to auscultation bilaterally. GI: Soft, nontender, nondistended. MS: No deformity or atrophy. Skin: Warm and dry, no rash. Neuro:  Strength and sensation are intact. Psych: Normal affect.  Assessment & Plan    Preoperative cardiovascular examination - upcoming hand surgery with Dr. Fredna Dow. According to the Revised Cardiac Risk Index (RCRI), his Perioperative Risk of Major Cardiac Event is (%): 11. His Functional Capacity in METs is: 4.64 according to the Duke Activity Status Index (DASI). He is deemed acceptable risk for the planned procedure without additional cardiovascular testing. EKG showed SB with PAC and PVC with no acute ST/T wave changes. This is a stable finding. Will route to Dr. Levell July office so he is aware. Given history of CAD, would prefer continuation of aspirin throughout the peri-operative period but permissible to hold if deemed necessary by surgeon.    CAD- Stable with no anginal symptoms. No indication for ischemic evaluation.  GDMT includes jardiance, Imdur, Aspirin. No BB cue to baseline bradycardia.   Bradycardia /LBBB - ZIO 03/2020 with no evidence of high-grade AV block. EKG today SB with first degree AV block PR 244 with PVC and PAC. No near syncope nor syncope.   HTN- BP well controlled. Continue current antihypertensive regimen.    HLD- Continue Rosuvastatin 10mg  daily.   DM2- Continue to follow with PCP.   Disposition: Follow up  as scheduled in October  with Dr. Tamala Julian  Signed, Loel Dubonnet, NP 01/30/2021, 11:51 AM [place]

## 2021-02-02 DIAGNOSIS — Z1389 Encounter for screening for other disorder: Secondary | ICD-10-CM | POA: Diagnosis not present

## 2021-02-02 DIAGNOSIS — Z0000 Encounter for general adult medical examination without abnormal findings: Secondary | ICD-10-CM | POA: Diagnosis not present

## 2021-02-02 DIAGNOSIS — I2510 Atherosclerotic heart disease of native coronary artery without angina pectoris: Secondary | ICD-10-CM | POA: Diagnosis not present

## 2021-02-02 DIAGNOSIS — Z794 Long term (current) use of insulin: Secondary | ICD-10-CM | POA: Diagnosis not present

## 2021-02-02 DIAGNOSIS — E1122 Type 2 diabetes mellitus with diabetic chronic kidney disease: Secondary | ICD-10-CM | POA: Diagnosis not present

## 2021-02-02 DIAGNOSIS — E1165 Type 2 diabetes mellitus with hyperglycemia: Secondary | ICD-10-CM | POA: Diagnosis not present

## 2021-02-02 DIAGNOSIS — E7800 Pure hypercholesterolemia, unspecified: Secondary | ICD-10-CM | POA: Diagnosis not present

## 2021-02-02 DIAGNOSIS — C61 Malignant neoplasm of prostate: Secondary | ICD-10-CM | POA: Diagnosis not present

## 2021-02-02 DIAGNOSIS — D509 Iron deficiency anemia, unspecified: Secondary | ICD-10-CM | POA: Diagnosis not present

## 2021-02-02 DIAGNOSIS — Z7984 Long term (current) use of oral hypoglycemic drugs: Secondary | ICD-10-CM | POA: Diagnosis not present

## 2021-02-02 DIAGNOSIS — Z8546 Personal history of malignant neoplasm of prostate: Secondary | ICD-10-CM | POA: Diagnosis not present

## 2021-02-02 DIAGNOSIS — E785 Hyperlipidemia, unspecified: Secondary | ICD-10-CM | POA: Diagnosis not present

## 2021-02-02 DIAGNOSIS — E1169 Type 2 diabetes mellitus with other specified complication: Secondary | ICD-10-CM | POA: Diagnosis not present

## 2021-02-02 DIAGNOSIS — N183 Chronic kidney disease, stage 3 unspecified: Secondary | ICD-10-CM | POA: Diagnosis not present

## 2021-02-02 DIAGNOSIS — K219 Gastro-esophageal reflux disease without esophagitis: Secondary | ICD-10-CM | POA: Diagnosis not present

## 2021-02-02 DIAGNOSIS — N1832 Chronic kidney disease, stage 3b: Secondary | ICD-10-CM | POA: Diagnosis not present

## 2021-02-02 DIAGNOSIS — I5030 Unspecified diastolic (congestive) heart failure: Secondary | ICD-10-CM | POA: Diagnosis not present

## 2021-02-02 DIAGNOSIS — I10 Essential (primary) hypertension: Secondary | ICD-10-CM | POA: Diagnosis not present

## 2021-02-06 ENCOUNTER — Encounter (HOSPITAL_BASED_OUTPATIENT_CLINIC_OR_DEPARTMENT_OTHER): Payer: Self-pay | Admitting: Orthopedic Surgery

## 2021-02-06 ENCOUNTER — Other Ambulatory Visit: Payer: Self-pay

## 2021-02-14 ENCOUNTER — Encounter (HOSPITAL_BASED_OUTPATIENT_CLINIC_OR_DEPARTMENT_OTHER)
Admission: RE | Disposition: A | Discharge status: Home / Self Care | Payer: Self-pay | Source: Home / Self Care | Attending: Orthopedic Surgery

## 2021-02-14 ENCOUNTER — Ambulatory Visit (HOSPITAL_BASED_OUTPATIENT_CLINIC_OR_DEPARTMENT_OTHER): Payer: PPO | Admitting: Anesthesiology

## 2021-02-14 ENCOUNTER — Encounter (HOSPITAL_BASED_OUTPATIENT_CLINIC_OR_DEPARTMENT_OTHER): Payer: Self-pay | Admitting: Orthopedic Surgery

## 2021-02-14 ENCOUNTER — Ambulatory Visit (HOSPITAL_BASED_OUTPATIENT_CLINIC_OR_DEPARTMENT_OTHER)
Admission: RE | Admit: 2021-02-14 | Discharge: 2021-02-14 | Disposition: A | Discharge status: Home / Self Care | Payer: PPO | Attending: Orthopedic Surgery | Admitting: Orthopedic Surgery

## 2021-02-14 ENCOUNTER — Other Ambulatory Visit: Payer: Self-pay

## 2021-02-14 DIAGNOSIS — Z888 Allergy status to other drugs, medicaments and biological substances status: Secondary | ICD-10-CM | POA: Diagnosis not present

## 2021-02-14 DIAGNOSIS — Z7982 Long term (current) use of aspirin: Secondary | ICD-10-CM | POA: Diagnosis not present

## 2021-02-14 DIAGNOSIS — Z8546 Personal history of malignant neoplasm of prostate: Secondary | ICD-10-CM | POA: Diagnosis not present

## 2021-02-14 DIAGNOSIS — Z885 Allergy status to narcotic agent status: Secondary | ICD-10-CM | POA: Insufficient documentation

## 2021-02-14 DIAGNOSIS — Z880 Allergy status to penicillin: Secondary | ICD-10-CM | POA: Insufficient documentation

## 2021-02-14 DIAGNOSIS — Z96612 Presence of left artificial shoulder joint: Secondary | ICD-10-CM | POA: Diagnosis not present

## 2021-02-14 DIAGNOSIS — I5032 Chronic diastolic (congestive) heart failure: Secondary | ICD-10-CM | POA: Diagnosis not present

## 2021-02-14 DIAGNOSIS — Z8261 Family history of arthritis: Secondary | ICD-10-CM | POA: Diagnosis not present

## 2021-02-14 DIAGNOSIS — Z794 Long term (current) use of insulin: Secondary | ICD-10-CM | POA: Diagnosis not present

## 2021-02-14 DIAGNOSIS — Z79899 Other long term (current) drug therapy: Secondary | ICD-10-CM | POA: Diagnosis not present

## 2021-02-14 DIAGNOSIS — Z96611 Presence of right artificial shoulder joint: Secondary | ICD-10-CM | POA: Insufficient documentation

## 2021-02-14 DIAGNOSIS — Z7984 Long term (current) use of oral hypoglycemic drugs: Secondary | ICD-10-CM | POA: Insufficient documentation

## 2021-02-14 DIAGNOSIS — I10 Essential (primary) hypertension: Secondary | ICD-10-CM | POA: Diagnosis not present

## 2021-02-14 DIAGNOSIS — E7800 Pure hypercholesterolemia, unspecified: Secondary | ICD-10-CM | POA: Diagnosis not present

## 2021-02-14 DIAGNOSIS — M65342 Trigger finger, left ring finger: Secondary | ICD-10-CM | POA: Insufficient documentation

## 2021-02-14 DIAGNOSIS — Z955 Presence of coronary angioplasty implant and graft: Secondary | ICD-10-CM | POA: Diagnosis not present

## 2021-02-14 DIAGNOSIS — Z87891 Personal history of nicotine dependence: Secondary | ICD-10-CM | POA: Insufficient documentation

## 2021-02-14 DIAGNOSIS — M65842 Other synovitis and tenosynovitis, left hand: Secondary | ICD-10-CM | POA: Diagnosis not present

## 2021-02-14 DIAGNOSIS — Z96652 Presence of left artificial knee joint: Secondary | ICD-10-CM | POA: Insufficient documentation

## 2021-02-14 DIAGNOSIS — I110 Hypertensive heart disease with heart failure: Secondary | ICD-10-CM | POA: Diagnosis not present

## 2021-02-14 HISTORY — PX: TRIGGER FINGER RELEASE: SHX641

## 2021-02-14 LAB — GLUCOSE, CAPILLARY
Glucose-Capillary: 150 mg/dL — ABNORMAL HIGH (ref 70–99)
Glucose-Capillary: 142 mg/dL — ABNORMAL HIGH (ref 70–99)

## 2021-02-14 LAB — BASIC METABOLIC PANEL
Anion gap: 9 (ref 5–15)
BUN: 41 mg/dL — ABNORMAL HIGH (ref 8–23)
CO2: 24 mmol/L (ref 22–32)
Calcium: 9.8 mg/dL (ref 8.9–10.3)
Chloride: 104 mmol/L (ref 98–111)
Creatinine, Ser: 2.08 mg/dL — ABNORMAL HIGH (ref 0.61–1.24)
GFR, Estimated: 30 mL/min — ABNORMAL LOW (ref >60)
Glucose, Bld: 158 mg/dL — ABNORMAL HIGH (ref 70–99)
Potassium: 4.3 mmol/L (ref 3.5–5.1)
Sodium: 137 mmol/L (ref 135–145)

## 2021-02-14 SURGERY — RELEASE, A1 PULLEY, FOR TRIGGER FINGER
Anesthesia: Monitor Anesthesia Care | Site: Hand | Laterality: Left

## 2021-02-14 MED ORDER — 0.9 % SODIUM CHLORIDE (POUR BTL) OPTIME
TOPICAL | Status: DC | PRN
  Administered 2021-02-14: 50 mL

## 2021-02-14 MED ORDER — PROPOFOL 500 MG/50ML IV EMUL
INTRAVENOUS | Status: DC | PRN
  Administered 2021-02-14: 50 ug/kg/min via INTRAVENOUS

## 2021-02-14 MED ORDER — FENTANYL CITRATE (PF) 100 MCG/2ML IJ SOLN: 25.0000 ug | INTRAMUSCULAR | Status: DC | PRN

## 2021-02-14 MED ORDER — FENTANYL CITRATE (PF) 100 MCG/2ML IJ SOLN
INTRAMUSCULAR | Status: DC | PRN
  Administered 2021-02-14: 50 ug via INTRAVENOUS
  Administered 2021-02-14: 25 ug via INTRAVENOUS

## 2021-02-14 MED ORDER — BUPIVACAINE HCL (PF) 0.25 % IJ SOLN
INTRAMUSCULAR | Status: DC | PRN
  Administered 2021-02-14: 8 mL

## 2021-02-14 MED ORDER — ACETAMINOPHEN 500 MG PO TABS
1000.0000 mg | ORAL_TABLET | Freq: Once | ORAL | Status: AC
  Administered 2021-02-14: 1000 mg via ORAL

## 2021-02-14 MED ORDER — CLINDAMYCIN PHOSPHATE 900 MG/50ML IV SOLN
INTRAVENOUS | Status: AC
  Filled 2021-02-14: qty 50

## 2021-02-14 MED ORDER — AMISULPRIDE (ANTIEMETIC) 5 MG/2ML IV SOLN: 10.0000 mg | Freq: Once | INTRAVENOUS | Status: DC | PRN

## 2021-02-14 MED ORDER — ACETAMINOPHEN 500 MG PO TABS
ORAL_TABLET | ORAL | Status: AC
  Filled 2021-02-14: qty 2

## 2021-02-14 MED ORDER — PROPOFOL 10 MG/ML IV BOLUS
INTRAVENOUS | Status: DC | PRN
  Administered 2021-02-14: 10 mg via INTRAVENOUS
  Administered 2021-02-14: 20 mg via INTRAVENOUS

## 2021-02-14 MED ORDER — FENTANYL CITRATE (PF) 100 MCG/2ML IJ SOLN
INTRAMUSCULAR | Status: AC
  Filled 2021-02-14: qty 2

## 2021-02-14 MED ORDER — CELECOXIB 200 MG PO CAPS
200.0000 mg | ORAL_CAPSULE | Freq: Once | ORAL | Status: AC
  Administered 2021-02-14: 200 mg via ORAL

## 2021-02-14 MED ORDER — LIDOCAINE HCL (PF) 0.5 % IJ SOLN
INTRAMUSCULAR | Status: DC | PRN
  Administered 2021-02-14: 30 mL via INTRAVENOUS

## 2021-02-14 MED ORDER — PROMETHAZINE HCL 25 MG/ML IJ SOLN: 6.2500 mg | INTRAMUSCULAR | Status: DC | PRN

## 2021-02-14 MED ORDER — CELECOXIB 200 MG PO CAPS
ORAL_CAPSULE | ORAL | Status: AC
  Filled 2021-02-14: qty 1

## 2021-02-14 MED ORDER — CLINDAMYCIN PHOSPHATE 900 MG/50ML IV SOLN: 900.0000 mg | INTRAVENOUS | Status: DC

## 2021-02-14 MED ORDER — LACTATED RINGERS IV SOLN: INTRAVENOUS | Status: DC

## 2021-02-14 MED ORDER — ONDANSETRON HCL 4 MG/2ML IJ SOLN
INTRAMUSCULAR | Status: AC
  Filled 2021-02-14: qty 2

## 2021-02-14 SURGICAL SUPPLY — 31 items
APL PRP STRL LF DISP 70% ISPRP (MISCELLANEOUS) ×1
BLADE SURG 15 STRL LF DISP TIS (BLADE) ×1 IMPLANT
BLADE SURG 15 STRL SS (BLADE) ×2
BNDG CMPR 9X4 STRL LF SNTH (GAUZE/BANDAGES/DRESSINGS)
BNDG COHESIVE 2X5 TAN ST LF (GAUZE/BANDAGES/DRESSINGS) ×2 IMPLANT
BNDG ESMARK 4X9 LF (GAUZE/BANDAGES/DRESSINGS) IMPLANT
CHLORAPREP W/TINT 26 (MISCELLANEOUS) ×2 IMPLANT
CORD BIPOLAR FORCEPS 12FT (ELECTRODE) IMPLANT
COVER BACK TABLE 60X90IN (DRAPES) ×2 IMPLANT
COVER MAYO STAND STRL (DRAPES) ×2 IMPLANT
CUFF TOURN SGL QUICK 18X4 (TOURNIQUET CUFF) IMPLANT
DECANTER SPIKE VIAL GLASS SM (MISCELLANEOUS) IMPLANT
DRAPE EXTREMITY T 121X128X90 (DISPOSABLE) ×2 IMPLANT
DRAPE SURG 17X23 STRL (DRAPES) ×2 IMPLANT
GAUZE SPONGE 4X4 12PLY STRL (GAUZE/BANDAGES/DRESSINGS) ×2 IMPLANT
GAUZE XEROFORM 1X8 LF (GAUZE/BANDAGES/DRESSINGS) ×2 IMPLANT
GLOVE SURG ORTHO LTX SZ8 (GLOVE) ×3 IMPLANT
GLOVE SURG UNDER POLY LF SZ8.5 (GLOVE) ×3 IMPLANT
GOWN STRL REUS W/ TWL LRG LVL3 (GOWN DISPOSABLE) ×1 IMPLANT
GOWN STRL REUS W/TWL LRG LVL3 (GOWN DISPOSABLE)
GOWN STRL REUS W/TWL XL LVL3 (GOWN DISPOSABLE) ×3 IMPLANT
NDL PRECISIONGLIDE 27X1.5 (NEEDLE) ×1 IMPLANT
NEEDLE PRECISIONGLIDE 27X1.5 (NEEDLE) ×2 IMPLANT
NS IRRIG 1000ML POUR BTL (IV SOLUTION) ×2 IMPLANT
PACK BASIN DAY SURGERY FS (CUSTOM PROCEDURE TRAY) ×2 IMPLANT
STOCKINETTE 4X48 STRL (DRAPES) ×2 IMPLANT
SUT ETHILON 4 0 PS 2 18 (SUTURE) ×2 IMPLANT
SYR BULB EAR ULCER 3OZ GRN STR (SYRINGE) ×2 IMPLANT
SYR CONTROL 10ML LL (SYRINGE) ×2 IMPLANT
TOWEL GREEN STERILE FF (TOWEL DISPOSABLE) ×4 IMPLANT
UNDERPAD 30X36 HEAVY ABSORB (UNDERPADS AND DIAPERS) ×2 IMPLANT

## 2021-02-14 NOTE — Discharge Instructions (Addendum)
[facility] Instructions Hand Surgery  Wound Care: Keep your hand elevated above the level of your heart.  Do not allow it to dangle by your side.  Keep the dressing dry and do not remove it unless your doctor advises you to do so.  He will usually change it at the time of your post-op visit.  Moving your fingers is advised to stimulate circulation but will depend on the site of your surgery.  If you have a splint applied, your doctor will advise you regarding movement.  Activity: Do not drive or operate machinery today.  Rest today and then you may return to your normal activity and work as indicated by your physician.  Diet:  Drink liquids today or eat a light diet.  You may resume a regular diet tomorrow.    General expectations: Pain for two to three days. Fingers may become slightly swollen.  Call your doctor if any of the following occur: Severe pain not relieved by pain medication. Elevated temperature. Dressing soaked with blood. Inability to move fingers. White or bluish color to fingers.   May take Tylenol after 3:20 pm, if needed.  May take NSAIDS (Ibuprofen, Motrin) after 3:20pm, if needed.   Post Anesthesia Home Care Instructions  Activity: Get plenty of rest for the remainder of the day. A responsible individual must stay with you for 24 hours following the procedure.  For the next 24 hours, DO NOT: -Drive a car -Paediatric nurse -Drink alcoholic beverages -Take any medication unless instructed by your physician -Make any legal decisions or sign important papers.  Meals: Start with liquid foods such as gelatin or soup. Progress to regular foods as tolerated. Avoid greasy, spicy, heavy foods. If nausea and/or vomiting occur, drink only clear liquids until the nausea and/or vomiting subsides. Call your physician if vomiting continues.  Special Instructions/Symptoms: Your throat may feel dry or sore from the anesthesia or the breathing tube placed in your throat  during surgery. If this causes discomfort, gargle with warm salt water. The discomfort should disappear within 24 hours.  If you had a scopolamine patch placed behind your ear for the management of post- operative nausea and/or vomiting:  1. The medication in the patch is effective for 72 hours, after which it should be removed.  Wrap patch in a tissue and discard in the trash. Wash hands thoroughly with soap and water. 2. You may remove the patch earlier than 72 hours if you experience unpleasant side effects which may include dry mouth, dizziness or visual disturbances. 3. Avoid touching the patch. Wash your hands with soap and water after contact with the patch.

## 2021-02-14 NOTE — Anesthesia Preprocedure Evaluation (Addendum)
Anesthesia Evaluation  Patient identified by MRN, date of birth, ID band Patient awake    Reviewed: Allergy & Precautions, NPO status , Patient's Chart, lab work & pertinent test results  History of Anesthesia Complications Negative for: history of anesthetic complications  Airway Mallampati: I  TM Distance: >3 FB Neck ROM: Full    Dental no notable dental hx. (+) Edentulous Upper, Edentulous Lower   Pulmonary shortness of breath, former smoker,    Pulmonary exam normal        Cardiovascular hypertension, Pt. on medications + angina + CAD, + Cardiac Stents and +CHF  Normal cardiovascular exam+ dysrhythmias  - Systolic murmurs LHC 12/598  Right dominant coronary anatomy with luminal irregularities in the proximal to mid segment.  Irregularities in the PDA and left ventricular branches.  Widely patent left main with distal 20% eccentric narrowing  LAD wraps around the left ventricular apex.  Proximal to mid vessel contains lucent 30 to 50% narrowing.  The first and second diagonal branches contain 75% ostial narrowing.  First septal perforator contains 90% ostial narrowing.  Circumflex is moderate in size and gives origin to a proximal to mid widely patent stent before 3 obtuse marginal branches arise.  The first obtuse marginal contains ostial 80% narrowing just distal to the stent margin.  The ostium of the circumflex contains 40 to 50% narrowing.  Overall normal LV function.  LVEDP upper normal at 17 mmHg.  EF estimated to be 55%.  RECOMMENDATIONS:   Symptoms of dyspnea are out of proportion to the degree of coronary disease.  The patient has 2 diagonals, a skeletal perforator, a small first obtuse marginal all of which have significant ostial disease but due to relatively small size of best treated with medical therapy.  Doubt these vessels are the source of the patient's complaints.  Continue secondary risk factor  modification.  Consider pulmonary consultation/other etiologies for dyspnea.    Neuro/Psych  Neuromuscular disease    GI/Hepatic Neg liver ROS, GERD  ,  Endo/Other  diabetes  Renal/GU Renal disease     Musculoskeletal  (+) Arthritis ,   Abdominal   Peds  Hematology  (+) anemia ,   Anesthesia Other Findings   Reproductive/Obstetrics                            Anesthesia Physical  Anesthesia Plan  ASA: 3  Anesthesia Plan: MAC and Regional   Post-op Pain Management:    Induction:   PONV Risk Score and Plan: 2 and Propofol infusion and Ondansetron  Airway Management Planned: Nasal Cannula and Simple Face Mask  Additional Equipment: None  Intra-op Plan:   Post-operative Plan:   Informed Consent: I have reviewed the patients History and Physical, chart, labs and discussed the procedure including the risks, benefits and alternatives for the proposed anesthesia with the patient or authorized representative who has indicated his/her understanding and acceptance.     Dental advisory given  Plan Discussed with: CRNA and Anesthesiologist  Anesthesia Plan Comments:        Anesthesia Quick Evaluation

## 2021-02-14 NOTE — Transfer of Care (Signed)
Immediate Anesthesia Transfer of Care Note  Patient: Ryan Hunt  Procedure(s) Performed: RELEASE TRIGGER FINGER/A-1 PULLEY LEFT RING FINGER (Left: Hand)  Patient Location: PACU  Anesthesia Type:MAC and Bier block  Level of Consciousness: drowsy, patient cooperative and responds to stimulation  Airway & Oxygen Therapy: Patient Spontanous Breathing and Patient connected to face mask oxygen  Post-op Assessment: Report given to RN and Post -op Vital signs reviewed and stable  Post vital signs: Reviewed and stable  Last Vitals:  Vitals Value Taken Time  BP 109/49 02/14/21 1057  Temp    Pulse 39 02/14/21 1058  Resp 14 02/14/21 1058  SpO2 98 % 02/14/21 1058  Vitals shown include unvalidated device data.  Last Pain:  Vitals:   02/14/21 0919  TempSrc: Oral  PainSc: 0-No pain         Complications: No notable events documented.

## 2021-02-14 NOTE — Anesthesia Postprocedure Evaluation (Signed)
Anesthesia Post Note  Patient: Ryan Hunt  Procedure(s) Performed: RELEASE TRIGGER FINGER/A-1 PULLEY LEFT RING FINGER (Left: Hand)     Patient location during evaluation: PACU Anesthesia Type: Regional and MAC Level of consciousness: awake and alert Pain management: pain level controlled Vital Signs Assessment: post-procedure vital signs reviewed and stable Respiratory status: spontaneous breathing and respiratory function stable Cardiovascular status: stable Postop Assessment: no apparent nausea or vomiting Anesthetic complications: no   No notable events documented.  Last Vitals:  Vitals:   02/14/21 1115 02/14/21 1130  BP: (!) 113/52 (!) 120/58  Pulse: (!) 42 (!) 42  Resp: 14 14  Temp:  36.6 C  SpO2: 95% 98%    Last Pain:  Vitals:   02/14/21 1130  TempSrc:   PainSc: 0-No pain                 Zackery Brine,Azhar DANIEL

## 2021-02-14 NOTE — H&P (Signed)
Ryan Hunt is an 87 y.o. male.   Chief Complaint: catching left ring finger HPI: Ryan Hunt is an 87 year old left-hand-dominant male who comes in complaining of his left ring finger. This been going on for several months. He has had at least 2 shots in it 1 by Dr. Para Hunt. He also had a period of time with his right ring finger catching. He is a former patient who I treated for a amputation of his right ring fingertip. He states nothing makes it better or worse. Is worse in the morning. He has taken his opposite hand to straighten it out. He has a history of diabetes and arthritis. He has a history of hypertension family history is positive for high blood pressure heart disease and arthritis. He is referred by Dr. Layne Hunt  Past Medical History:  Diagnosis Date   Anemia    Anginal pain ([place])    Arthritis    "knees"   Bronchitis 11/2011   "first time for me"   CAD (coronary artery disease)    With DES circumflex 02/2009   CHF (congestive heart failure) ([place])    Coronary atherosclerosis of native coronary artery    Diabetes mellitus with kidney disease (HCC)    Diastolic heart failure ([place])    Diverticulosis of colon (without mention of hemorrhage)    Dyspnea    Exertional dyspnea    Chronic   GERD (gastroesophageal reflux disease)    History of kidney stones    History of peptic ulcer disease    Remote history   Hypercholesterolemia    Hypertension    Kidney stone    "dr told me my kidney was loaded w/stones; imbedded"   Labyrinthitis    Leg edema    Meralgia paresthetica    Mixed hyperlipidemia    Osteoarthrosis, unspecified whether generalized or localized, lower leg    Palpitations    Prostate cancer ([place])    s/p prostatectomy   Type II diabetes mellitus ([place])    dx 3 yrs ago    Past Surgical History:  Procedure Laterality Date   BACK SURGERY     BIOPSY  04/09/2019   Procedure: BIOPSY;  Surgeon: Ryan Spates, MD;  Location: [facility];  Service: Endoscopy;;    CARDIAC CATHETERIZATION     CATARACT EXTRACTION W/ INTRAOCULAR LENS IMPLANT & ANTERIOR VITRECTOMY, BILATERAL  1990's   COLONOSCOPY     COLONOSCOPY WITH PROPOFOL N/A 04/09/2019   Procedure: COLONOSCOPY WITH PROPOFOL;  Surgeon: Ryan Spates, MD;  Location: [facility];  Service: Endoscopy;  Laterality: N/A;   COLONOSCOPY WITH PROPOFOL N/A 08/31/2019   Procedure: COLONOSCOPY WITH PROPOFOL;  Surgeon: Ryan Brace, MD;  Location: [facility];  Service: Gastroenterology;  Laterality: N/A;   COLONOSCOPY WITH PROPOFOL N/A 03/29/2020   Procedure: COLONOSCOPY WITH PROPOFOL;  Surgeon: Ryan Brace, MD;  Location: [facility];  Service: Gastroenterology;  Laterality: N/A;   CORONARY ANGIOPLASTY WITH STENT PLACEMENT  02/2009   Stent x 1 (DES circumflex)   DIAGNOSTIC LAPAROSCOPY     ESOPHAGOGASTRODUODENOSCOPY (EGD) WITH PROPOFOL N/A 04/09/2019   Procedure: ESOPHAGOGASTRODUODENOSCOPY (EGD) WITH PROPOFOL;  Surgeon: Ryan Spates, MD;  Location: [facility];  Service: Endoscopy;  Laterality: N/A;   EYE SURGERY     bil lens implants   HEMOSTASIS CLIP PLACEMENT  08/31/2019   Procedure: HEMOSTASIS CLIP PLACEMENT;  Surgeon: Ryan Brace, MD;  Location: [facility];  Service: Gastroenterology;;  Ascending Polyp    HEMOSTASIS CONTROL  03/29/2020   Procedure: HEMOSTASIS CONTROL;  Surgeon: Ryan Brace,  MD;  Location: [facility];  Service: Gastroenterology;;   JOINT REPLACEMENT     s/p left knee   LASIK Bilateral    LEFT HEART CATH AND CORONARY ANGIOGRAPHY N/A 02/27/2018   Procedure: LEFT HEART CATH AND CORONARY ANGIOGRAPHY;  Surgeon: Ryan Crome, MD;  Location: [facility] [facility];  Service: Cardiovascular;  Laterality: N/A;   LUMBAR LAMINECTOMY/DECOMPRESSION MICRODISCECTOMY N/A 12/23/2014   Procedure:  L2-3 DECOMPRESSION;  Surgeon: Ryan Schools, MD;  Location: [place] [facility] [place];  Service: Orthopedics;  Laterality: N/A;   POLYPECTOMY  04/09/2019   Procedure: POLYPECTOMY;  Surgeon: Ryan Spates, MD;   Location: [facility];  Service: Endoscopy;;   POLYPECTOMY  08/31/2019   Procedure: POLYPECTOMY;  Surgeon: Ryan Brace, MD;  Location: [facility];  Service: Gastroenterology;;   POLYPECTOMY  03/29/2020   Procedure: POLYPECTOMY;  Surgeon: Ryan Brace, MD;  Location: [facility];  Service: Gastroenterology;;   PROSTATECTOMY  2009   Robotic prostatectomy    Skin Lumps Removed     TOTAL KNEE ARTHROPLASTY  12/31/2011   Procedure: TOTAL KNEE ARTHROPLASTY;  Surgeon: Ryan Junes, MD;  Location: [facility];  Service: Orthopedics;  Laterality: Left;  Left total knee arthroplasty   TOTAL SHOULDER REPLACEMENT  52's   bilaterally    Family History  Problem Relation Age of Onset   CVA Father    CAD Father    Prostate cancer Father    Heart disease Father        ASHD   Stroke Father    Nephritis Brother    COPD Brother    CVA Mother    Tremor Mother    Dementia Mother    Heart attack Neg Hx    Social History:  reports that he quit smoking about 49 years ago. His smoking use included cigarettes. He has a 7.50 pack-year smoking history. He has never used smokeless tobacco. He reports current alcohol use. He reports that he does not use drugs.  Allergies:  Allergies  Allergen Reactions   Amoxicillin Hives, Rash and Other (See Comments)    "sores in my mouth" Did it involve swelling of the face/tongue/throat, SOB, or low BP? Yes Did it involve sudden or severe rash/hives, skin peeling, or any reaction on the inside of your mouth or nose? Yes Did you need to seek medical attention at a hospital or doctor's office? Unknown When did it last happen?at least 10 years ago If all above answers are "NO", may proceed with cephalosporin use.    Oxycodone Hcl     agitation   Simvastatin Itching    Medications Prior to Admission  Medication Sig Dispense Refill   amLODipine (NORVASC) 10 MG tablet Take 1 tablet (10 mg total) by mouth daily. 90 tablet 3   aspirin EC 81 MG tablet Take 1  tablet (81 mg total) by mouth daily. 90 tablet 3   Continuous Blood Gluc Receiver (FREESTYLE LIBRE 14 DAY READER) DEVI SMARTSIG:1 Patch(s) Topical As Directed     docusate sodium (COLACE) 250 MG capsule Take 250 mg by mouth at bedtime.     empagliflozin (JARDIANCE) 10 MG TABS tablet Take 2 tablets by mouth daily.     fluorometholone (FLAREX) 0.1 % ophthalmic suspension Place 1 drop into both eyes 3 (three) times daily as needed (eye floaters).     furosemide (LASIX) 40 MG tablet Take 40 mg by mouth 2 (two) times daily.      isosorbide mononitrate (IMDUR) 30 MG 24 hr tablet Take 30 mg by  mouth daily.     LANTUS SOLOSTAR 100 UNIT/ML Solostar Pen Inject 35 Units into the skin daily.     rosuvastatin (CRESTOR) 10 MG tablet Take 10 mg by mouth daily.     telmisartan (MICARDIS) 80 MG tablet Take 1 tablet (80 mg total) by mouth daily. 30 tablet 2   traMADol (ULTRAM) 50 MG tablet Take 50 mg by mouth 2 (two) times daily as needed for moderate pain (arthritis back pain.).      vitamin B-12 (CYANOCOBALAMIN) 1000 MCG tablet Take 1,000 mcg by mouth at bedtime.     Vitamin D3 (VITAMIN D) 25 MCG tablet Take 1,000 Units by mouth daily.     Aromatic Inhalants (VICKS VAPOINHALER IN) Inhale 1 puff into the lungs at bedtime.     Continuous Blood Gluc Sensor (FREESTYLE LIBRE 14 DAY SENSOR) MISC Apply topically.     nitroGLYCERIN (NITROSTAT) 0.4 MG SL tablet Place 0.4 mg under the tongue every 5 (five) minutes as needed for chest pain.      ONE TOUCH ULTRA TEST test strip       No results found for this or any previous visit (from the past 48 hour(s)).  No results found.   Pertinent items are noted in HPI.  Blood pressure (!) 136/50, pulse (!) 56, temperature 97.7 F (36.5 C), temperature source Oral, resp. rate 20, height 5\' 8"  (1.727 m), weight 96.1 kg, SpO2 97 %.  General appearance: alert, cooperative, and appears stated age Head: Normocephalic, without obvious abnormality Neck: no JVD Resp: clear to  auscultation bilaterally Cardio: regular rate and rhythm, S1, S2 normal, no murmur, click, rub or gallop GI: soft, non-tender; bowel sounds normal; no masses,  no organomegaly Extremities: catching left ring finger Pulses: 2+ and symmetric Skin: Skin color, texture, turgor normal. No rashes or lesions Neurologic: Grossly normal Incision/Wound: na  Assessment/Plan Assessment:  Trigger ring finger of left hand ring finger   Plan: We have discussed with him the possibility of another injection he has declined he would like to have this operated on. Prepare postoperative course are discussed along with risk and complications. He is aware there is no guarantee to the surgery the possibility of infection recurrence injury to arteries nerves tendons complete relief symptoms dystrophy is scheduled for release A1 pulley left ring finger as an outpatient under regional anesthesia   Daryll Brod 02/14/2021, 9:28 AM

## 2021-02-14 NOTE — Anesthesia Procedure Notes (Signed)
Procedure Name: MAC Date/Time: 02/14/2021 10:27 AM Performed by: Glory Buff, CRNA Oxygen Delivery Method: Simple face mask

## 2021-02-14 NOTE — Anesthesia Procedure Notes (Signed)
Anesthesia Regional Block: Bier block (IV Regional)   Pre-Anesthetic Checklist: , timeout performed,  Correct Patient, Correct Site, Correct Laterality,  Correct Procedure, Correct Position, site marked,  Risks and benefits discussed,  Surgical consent,  Pre-op evaluation,  At surgeon's request  Laterality: Left  Prep: alcohol swabs        Procedures:,,,,, intact distal pulses, Esmarch exsanguination    Narrative:  Start time: 02/14/2021 10:33 AM End time: 02/14/2021 10:34 AM Injection made incrementally with aspirations every 30 mL. CRNA: Glory Buff, CRNA

## 2021-02-14 NOTE — Op Note (Signed)
NAME: Ryan Hunt MEDICAL RECORD NO: [id number] DATE OF BIRTH: [birth year] FACILITY: [facility] LOCATION: [facility] PHYSICIAN: Wynonia Sours, MD   OPERATIVE REPORT   DATE OF PROCEDURE: 02/14/21    PREOPERATIVE DIAGNOSIS: Stenosing tenosynovitis left ring finger   POSTOPERATIVE DIAGNOSIS: Same   PROCEDURE: Release A1 pulley left ring finger   SURGEON: Daryll Brod, M.D.   ASSISTANT: none   ANESTHESIA:  Bier block with sedation and Local   INTRAVENOUS FLUIDS:  Per anesthesia flow sheet.   ESTIMATED BLOOD LOSS:  Minimal.   COMPLICATIONS:  None.   SPECIMENS:  none   TOURNIQUET TIME:    Total Tourniquet Time Documented: Forearm (Left) - 18 minutes Total: Forearm (Left) - 18 minutes    DISPOSITION:  Stable to PACU.   INDICATIONS: Patient is an 87 year old male with history of triggering of right left ring finger.  He is elected undergo surgical release.  He is aware that there is no guarantee to the surgery the possibility of infection recurrence injury to arteries nerves tendons incomplete relief symptoms dystrophy.  In the preoperative area the patient was seen extremity marked by palpation surgeon antibiotic given  OPERATIVE COURSE: Patient is brought to the operating room where placed in the supine position forearm IV regional anesthetic was carried out without difficulty under the direction of the anesthesia department.  Prep was done with ChloraPrep.  3-minute dry time allowed timeout taken to confirm patient procedure.  Oblique incision was made over the A1 pulley left ring finger carried down through subcutaneous tissue.  The A1 pulley was found to be markedly thickened this was released on its radial aspect.  A small incision made centrally and A2.  Tenosynovial tissue proximally was broken was blunt dissection by placing retractors on the flexor tendon separating these.  The finger was placed through full range of motion no further triggering was noted.   Wound was copiously irrigated with saline.  Skin was closed with interrupted 4 nylon sutures.  Local infiltration quarter percent bupivacaine without epinephrine was given approximately 8 cc was used.  Sterile compressive dressing with the fingers free was applied.  Deflation of the tourniquet all fingers immediately pink.  He was taken to the recovery room for observation in satisfactory condition.  Will be discharged home return to the [facility] in 1 week on Tylenol ibuprofen for pain.  He has Ultram for breakthrough.   Daryll Brod, MD Electronically signed, 02/14/21

## 2021-02-14 NOTE — Brief Op Note (Signed)
02/14/2021  10:57 AM  PATIENT:  Levy Pupa  87 y.o. male  PRE-OPERATIVE DIAGNOSIS:  STENOSING TENOSYNOVITIS LEFT RING FINGER  POST-OPERATIVE DIAGNOSIS:  STENOSING TENOSYNOVITIS LEFT RING FINGER  PROCEDURE:  Procedure(s) with comments: RELEASE TRIGGER FINGER/A-1 PULLEY LEFT RING FINGER (Left) - 45 MIN  SURGEON:  Surgeon(s) and Role:    * Daryll Brod, MD - Primary  PHYSICIAN ASSISTANT:   ASSISTANTS: none   ANESTHESIA:   local, regional, and IV sedation  EBL:  0 mL   BLOOD ADMINISTERED:none  DRAINS: none   LOCAL MEDICATIONS USED:  BUPIVICAINE   SPECIMEN:  No Specimen  DISPOSITION OF SPECIMEN:  N/A  COUNTS:  YES  TOURNIQUET:   Total Tourniquet Time Documented: Forearm (Left) - 18 minutes Total: Forearm (Left) - 18 minutes   DICTATION: .Dragon Dictation  PLAN OF CARE: Discharge to home after PACU  PATIENT DISPOSITION:  PACU - hemodynamically stable.

## 2021-02-15 ENCOUNTER — Encounter (HOSPITAL_BASED_OUTPATIENT_CLINIC_OR_DEPARTMENT_OTHER): Payer: Self-pay | Admitting: Orthopedic Surgery

## 2021-02-27 DIAGNOSIS — H04122 Dry eye syndrome of left lacrimal gland: Secondary | ICD-10-CM | POA: Diagnosis not present

## 2021-02-27 DIAGNOSIS — H04123 Dry eye syndrome of bilateral lacrimal glands: Secondary | ICD-10-CM | POA: Diagnosis not present

## 2021-02-27 DIAGNOSIS — H43393 Other vitreous opacities, bilateral: Secondary | ICD-10-CM | POA: Diagnosis not present

## 2021-02-27 DIAGNOSIS — E119 Type 2 diabetes mellitus without complications: Secondary | ICD-10-CM | POA: Diagnosis not present

## 2021-02-27 DIAGNOSIS — H353131 Nonexudative age-related macular degeneration, bilateral, early dry stage: Secondary | ICD-10-CM | POA: Diagnosis not present

## 2021-02-27 DIAGNOSIS — H04121 Dry eye syndrome of right lacrimal gland: Secondary | ICD-10-CM | POA: Diagnosis not present

## 2021-03-02 DIAGNOSIS — I10 Essential (primary) hypertension: Secondary | ICD-10-CM | POA: Diagnosis not present

## 2021-03-02 DIAGNOSIS — D509 Iron deficiency anemia, unspecified: Secondary | ICD-10-CM | POA: Diagnosis not present

## 2021-03-02 DIAGNOSIS — E1165 Type 2 diabetes mellitus with hyperglycemia: Secondary | ICD-10-CM | POA: Diagnosis not present

## 2021-03-02 DIAGNOSIS — I2510 Atherosclerotic heart disease of native coronary artery without angina pectoris: Secondary | ICD-10-CM | POA: Diagnosis not present

## 2021-03-02 DIAGNOSIS — C61 Malignant neoplasm of prostate: Secondary | ICD-10-CM | POA: Diagnosis not present

## 2021-03-02 DIAGNOSIS — N183 Chronic kidney disease, stage 3 unspecified: Secondary | ICD-10-CM | POA: Diagnosis not present

## 2021-03-02 DIAGNOSIS — E7800 Pure hypercholesterolemia, unspecified: Secondary | ICD-10-CM | POA: Diagnosis not present

## 2021-03-02 DIAGNOSIS — I5030 Unspecified diastolic (congestive) heart failure: Secondary | ICD-10-CM | POA: Diagnosis not present

## 2021-03-02 DIAGNOSIS — E785 Hyperlipidemia, unspecified: Secondary | ICD-10-CM | POA: Diagnosis not present

## 2021-03-02 DIAGNOSIS — K219 Gastro-esophageal reflux disease without esophagitis: Secondary | ICD-10-CM | POA: Diagnosis not present

## 2021-03-02 DIAGNOSIS — E1122 Type 2 diabetes mellitus with diabetic chronic kidney disease: Secondary | ICD-10-CM | POA: Diagnosis not present

## 2021-03-02 DIAGNOSIS — F4321 Adjustment disorder with depressed mood: Secondary | ICD-10-CM | POA: Diagnosis not present

## 2021-03-20 DIAGNOSIS — E1165 Type 2 diabetes mellitus with hyperglycemia: Secondary | ICD-10-CM | POA: Diagnosis not present

## 2021-03-20 DIAGNOSIS — I10 Essential (primary) hypertension: Secondary | ICD-10-CM | POA: Diagnosis not present

## 2021-03-20 DIAGNOSIS — E1122 Type 2 diabetes mellitus with diabetic chronic kidney disease: Secondary | ICD-10-CM | POA: Diagnosis not present

## 2021-03-20 DIAGNOSIS — N1832 Chronic kidney disease, stage 3b: Secondary | ICD-10-CM | POA: Diagnosis not present

## 2021-03-20 DIAGNOSIS — E1169 Type 2 diabetes mellitus with other specified complication: Secondary | ICD-10-CM | POA: Diagnosis not present

## 2021-03-20 DIAGNOSIS — K219 Gastro-esophageal reflux disease without esophagitis: Secondary | ICD-10-CM | POA: Diagnosis not present

## 2021-03-20 DIAGNOSIS — E7800 Pure hypercholesterolemia, unspecified: Secondary | ICD-10-CM | POA: Diagnosis not present

## 2021-04-05 DIAGNOSIS — Z86010 Personal history of colonic polyps: Secondary | ICD-10-CM | POA: Diagnosis not present

## 2021-04-05 DIAGNOSIS — Z8679 Personal history of other diseases of the circulatory system: Secondary | ICD-10-CM | POA: Diagnosis not present

## 2021-04-25 NOTE — Progress Notes (Signed)
Cardiology Office Note:    Date:  04/26/2021   ID:  Ryan Hunt, DOB May 25, 1931, MRN [id number]  PCP:  Seward Carol, MD  Cardiologist:  Sinclair Grooms, MD   Referring MD: Seward Carol, MD   Chief Complaint  Patient presents with   Coronary Artery Disease   Congestive Heart Failure   Hyperlipidemia     History of Present Illness:    Ryan Hunt is a 87 y.o. male with a hx of CAD with circumflex stent 4098, diastolic heart failure, chronic dyspnea on exertion, DM II, hyperlipidemia, and dietary indiscretion.   Says he feels himself aging.  He has no angina or other cardiac complaints.  Has not had syncope.  Denies chest pain or nitroglycerin use.  Past Medical History:  Diagnosis Date   Anemia    Anginal pain ([place])    Arthritis    "knees"   Bronchitis 11/2011   "first time for me"   CAD (coronary artery disease)    With DES circumflex 02/2009   CHF (congestive heart failure) (HCC)    Coronary atherosclerosis of native coronary artery    Diabetes mellitus with kidney disease (HCC)    Diastolic heart failure ([place])    Diverticulosis of colon (without mention of hemorrhage)    Dyspnea    Exertional dyspnea    Chronic   GERD (gastroesophageal reflux disease)    History of kidney stones    History of peptic ulcer disease    Remote history   Hypercholesterolemia    Hypertension    Kidney stone    "dr told me my kidney was loaded w/stones; imbedded"   Labyrinthitis    Leg edema    Meralgia paresthetica    Mixed hyperlipidemia    Osteoarthrosis, unspecified whether generalized or localized, lower leg    Palpitations    Prostate cancer ([place])    s/p prostatectomy   Type II diabetes mellitus ([place])    dx 3 yrs ago    Past Surgical History:  Procedure Laterality Date   BACK SURGERY     BIOPSY  04/09/2019   Procedure: BIOPSY;  Surgeon: Laurence Spates, MD;  Location: [facility];  Service: Endoscopy;;   CARDIAC CATHETERIZATION     CATARACT  EXTRACTION W/ INTRAOCULAR LENS IMPLANT & ANTERIOR VITRECTOMY, BILATERAL  1990's   COLONOSCOPY     COLONOSCOPY WITH PROPOFOL N/A 04/09/2019   Procedure: COLONOSCOPY WITH PROPOFOL;  Surgeon: Laurence Spates, MD;  [facility];  Service: Endoscopy;  Laterality: N/A;   COLONOSCOPY WITH PROPOFOL N/A 08/31/2019   Procedure: COLONOSCOPY WITH PROPOFOL;  Surgeon: Otis Brace, MD;  [facility];  Service: Gastroenterology;  Laterality: N/A;   COLONOSCOPY WITH PROPOFOL N/A 03/29/2020   Procedure: COLONOSCOPY WITH PROPOFOL;  Surgeon: Otis Brace, MD;  [facility];  Service: Gastroenterology;  Laterality: N/A;   CORONARY ANGIOPLASTY WITH STENT PLACEMENT  02/2009   Stent x 1 (DES circumflex)   DIAGNOSTIC LAPAROSCOPY     ESOPHAGOGASTRODUODENOSCOPY (EGD) WITH PROPOFOL N/A 04/09/2019   Procedure: ESOPHAGOGASTRODUODENOSCOPY (EGD) WITH PROPOFOL;  Surgeon: Laurence Spates, MD;  [facility];  Service: Endoscopy;  Laterality: N/A;   EYE SURGERY     bil lens implants   HEMOSTASIS CLIP PLACEMENT  08/31/2019   Procedure: HEMOSTASIS CLIP PLACEMENT;  Surgeon: Otis Brace, MD;  [facility];  Service: Gastroenterology;;  Ascending Polyp    HEMOSTASIS CONTROL  03/29/2020   Procedure: HEMOSTASIS CONTROL;  Surgeon: Otis Brace, MD;  [facility];  Service: Gastroenterology;;   JOINT REPLACEMENT     s/p left knee   LASIK Bilateral    LEFT HEART CATH AND CORONARY ANGIOGRAPHY N/A 02/27/2018   Procedure: LEFT HEART CATH AND CORONARY ANGIOGRAPHY;  Surgeon: Belva Crome, MD;  Location: [facility];  Service: Cardiovascular;  Laterality: N/A;   LUMBAR LAMINECTOMY/DECOMPRESSION MICRODISCECTOMY N/A 12/23/2014   Procedure:  L2-3 DECOMPRESSION;  Surgeon: Melina Schools, MD;  Location: [facility];  Service: Orthopedics;  Laterality: N/A;   POLYPECTOMY  04/09/2019   Procedure: POLYPECTOMY;  Surgeon: Laurence Spates, MD;  [facility];  Service:  Endoscopy;;   POLYPECTOMY  08/31/2019   Procedure: POLYPECTOMY;  Surgeon: Otis Brace, MD;  [facility];  Service: Gastroenterology;;   POLYPECTOMY  03/29/2020   Procedure: POLYPECTOMY;  Surgeon: Otis Brace, MD;  [facility];  Service: Gastroenterology;;   PROSTATECTOMY  2009   Robotic prostatectomy    Skin Lumps Removed     TOTAL KNEE ARTHROPLASTY  12/31/2011   Procedure: TOTAL KNEE ARTHROPLASTY;  Surgeon: Lorn Junes, MD;  Location: [place] [facility];  Service: Orthopedics;  Laterality: Left;  Left total knee arthroplasty   TOTAL SHOULDER REPLACEMENT  1990's   bilaterally   TRIGGER FINGER RELEASE Left 02/14/2021   Procedure: RELEASE TRIGGER FINGER/A-1 PULLEY LEFT RING FINGER;  Surgeon: Daryll Brod, MD;  Location: [facility];  Service: Orthopedics;  Laterality: Left;  45 MIN    Current Medications: No outpatient medications have been marked as taking for the 04/26/21 encounter (Office Visit) with Belva Crome, MD.     Allergies:   Amoxicillin, Oxycodone hcl, and Simvastatin   Social History   Socioeconomic History   Marital status: Widowed    Spouse name: Not on file   Number of children: 1   Years of education: Not on file   Highest education level: Not on file  Occupational History   Occupation: Retired  Tobacco Use   Smoking status: Former    Packs/day: 0.50    Years: 15.00    Pack years: 7.50    Types: Cigarettes    Quit date: 07/10/1971    Years since quitting: 49.8   Smokeless tobacco: Never  Vaping Use   Vaping Use: Never used  Substance and Sexual Activity   Alcohol use: Yes    Comment: 01/01/12 "occassionall have a glass of beer or wine; not even once a week"   Drug use: No   Sexual activity: Not Currently  Other Topics Concern   Not on file  Social History Narrative   Not on file   Social Determinants of Health   Financial Resource Strain: Not on file  Food Insecurity: Not on file  Transportation Needs: Not on  file  Physical Activity: Not on file  Stress: Not on file  Social Connections: Not on file     Family History: The patient's family history includes CAD in his father; COPD in his brother; CVA in his father and mother; Dementia in his mother; Heart disease in his father; Nephritis in his brother; Prostate cancer in his father; Stroke in his father; Tremor in his mother. There is no history of Heart attack.  ROS:   Please see the history of present illness.    He had to walk quite a distance to get in from the parking lot.  He developed shortness of breath.  He is now recovered and feels well.  All other systems reviewed and are negative.  EKGs/Labs/Other Studies Reviewed:  The following studies were reviewed today: Echocardiogram 2021  IMPRESSIONS     1. Left ventricular ejection fraction, by estimation, is 65 to 70%. The  left ventricle has normal function. There is moderate concentric left  ventricular hypertrophy. Left ventricular diastolic parameters are  consistent with Grade I diastolic  dysfunction (impaired relaxation). Narrow LV outflow tract with peak  gradient 22 mmHg. No systolic anterior motion of the mitral valve.   2. Right ventricular systolic function is normal. The right ventricular  size is normal. There is normal pulmonary artery systolic pressure. The  estimated right ventricular systolic pressure is 02.7 mmHg.   3. Left atrial size was mildly dilated.   4. The aortic valve is tricuspid. Aortic valve regurgitation is not  visualized. Mild aortic valve sclerosis is present, with no evidence of  aortic valve stenosis.   5. The mitral valve is normal in structure. Mild mitral valve  regurgitation. No evidence of mitral stenosis.   6. The inferior vena cava is normal in size with greater than 50%  respiratory variability, suggesting right atrial pressure of 3 mmHg.   Comparison(s): 12/06/16.  EKG:  EKG not performed today.  When done January 30, 2021, reveals no  sinus bradycardia, PACs, PVCs, biatrial abnormality, poor R wave progression.  Obese  Recent Labs: 02/14/2021: BUN 41; Creatinine, Ser 2.08; Potassium 4.3; Sodium 137  Recent Lipid Panel    Component Value Date/Time   CHOL 142 02/27/2018 0702   TRIG 75 02/27/2018 0702   HDL 44 02/27/2018 0702   CHOLHDL 3.2 02/27/2018 0702   VLDL 15 02/27/2018 0702   LDLCALC 83 02/27/2018 0702    Physical Exam:    VS:  BP (!) 114/56   Pulse 61   Ht 5' 8.5" (1.74 m)   Wt 214 lb 12.8 oz (97.4 kg)   SpO2 94%   BMI 32.19 kg/m     Wt Readings from Last 3 Encounters:  04/26/21 214 lb 12.8 oz (97.4 kg)  02/14/21 211 lb 13.8 oz (96.1 kg)  01/30/21 207 lb (93.9 kg)     GEN: Obese. No acute distress HEENT: Normal NECK: No JVD. LYMPHATICS: No lymphadenopathy CARDIAC: 2/6 right upper sternal systolic murmur. RRR no thank you gallop, or edema. VASCULAR:  Normal Pulses. No bruits. RESPIRATORY:  Clear to auscultation without rales, wheezing or rhonchi  ABDOMEN: Soft, non-tender, non-distended, No pulsatile mass, MUSCULOSKELETAL: No deformity  SKIN: Warm and dry NEUROLOGIC:  Alert and oriented x 3 PSYCHIATRIC:  Normal affect   ASSESSMENT:    1. Coronary artery disease involving native coronary artery of native heart without angina pectoris   2. LBBB (left bundle branch block)   3. Essential hypertension   4. Hypercholesterolemia   5. Type 2 diabetes mellitus with complication, without long-term current use of insulin ([place])   6. Systolic murmur    PLAN:    In order of problems listed above:  Secondary prevention discussed. Most recent EKG did not demonstrate left bundle but rather interventricular conduction delay Excellent blood pressure control 114/56 LDL is excellent for age at 24.  Continue Crestor.  He should probably be on a slightly higher dose. Love that he is on SGLT2 therapy.  Continue full cardioprotection. Systolic murmur is due to LV outflow obstruction that is not significant.   Aortic valve is normal.   Clinical follow-up.  Encouraged exercise.   Overall education and awareness concerning primary/secondary risk prevention was discussed in detail: LDL less than 70, hemoglobin A1c less than 7,  blood pressure target less than 130/80 mmHg, >150 minutes of moderate aerobic activity per week, avoidance of smoking, weight control (via diet and exercise), and continued surveillance/management of/for obstructive sleep apnea.    Medication Adjustments/Labs and Tests Ordered: Current medicines are reviewed at length with the patient today.  Concerns regarding medicines are outlined above.  No orders of the defined types were placed in this encounter.  No orders of the defined types were placed in this encounter.   There are no Patient Instructions on file for this visit.   Signed, Sinclair Grooms, MD  04/26/2021 4:25 PM    [facility]

## 2021-04-26 ENCOUNTER — Other Ambulatory Visit: Payer: Self-pay

## 2021-04-26 ENCOUNTER — Ambulatory Visit: Payer: PPO | Admitting: Interventional Cardiology

## 2021-04-26 ENCOUNTER — Encounter: Payer: Self-pay | Admitting: Interventional Cardiology

## 2021-04-26 VITALS — BP 114/56 | HR 61 | Ht 68.5 in | Wt 214.8 lb

## 2021-04-26 DIAGNOSIS — I2510 Atherosclerotic heart disease of native coronary artery without angina pectoris: Secondary | ICD-10-CM

## 2021-04-26 DIAGNOSIS — R011 Cardiac murmur, unspecified: Secondary | ICD-10-CM | POA: Diagnosis not present

## 2021-04-26 DIAGNOSIS — E7800 Pure hypercholesterolemia, unspecified: Secondary | ICD-10-CM | POA: Diagnosis not present

## 2021-04-26 DIAGNOSIS — I447 Left bundle-branch block, unspecified: Secondary | ICD-10-CM

## 2021-04-26 DIAGNOSIS — E118 Type 2 diabetes mellitus with unspecified complications: Secondary | ICD-10-CM | POA: Diagnosis not present

## 2021-04-26 DIAGNOSIS — I10 Essential (primary) hypertension: Secondary | ICD-10-CM | POA: Diagnosis not present

## 2021-04-26 NOTE — Patient Instructions (Signed)

## 2021-05-17 ENCOUNTER — Other Ambulatory Visit: Payer: Self-pay | Admitting: Interventional Cardiology

## 2021-05-19 DIAGNOSIS — Z7984 Long term (current) use of oral hypoglycemic drugs: Secondary | ICD-10-CM | POA: Diagnosis not present

## 2021-05-19 DIAGNOSIS — I10 Essential (primary) hypertension: Secondary | ICD-10-CM | POA: Diagnosis not present

## 2021-05-19 DIAGNOSIS — E1165 Type 2 diabetes mellitus with hyperglycemia: Secondary | ICD-10-CM | POA: Diagnosis not present

## 2021-06-06 DIAGNOSIS — E1169 Type 2 diabetes mellitus with other specified complication: Secondary | ICD-10-CM | POA: Diagnosis not present

## 2021-06-06 DIAGNOSIS — I10 Essential (primary) hypertension: Secondary | ICD-10-CM | POA: Diagnosis not present

## 2021-06-06 DIAGNOSIS — I5030 Unspecified diastolic (congestive) heart failure: Secondary | ICD-10-CM | POA: Diagnosis not present

## 2021-06-06 DIAGNOSIS — N1832 Chronic kidney disease, stage 3b: Secondary | ICD-10-CM | POA: Diagnosis not present

## 2021-06-06 DIAGNOSIS — E1122 Type 2 diabetes mellitus with diabetic chronic kidney disease: Secondary | ICD-10-CM | POA: Diagnosis not present

## 2021-06-06 DIAGNOSIS — E1165 Type 2 diabetes mellitus with hyperglycemia: Secondary | ICD-10-CM | POA: Diagnosis not present

## 2021-06-06 DIAGNOSIS — E7800 Pure hypercholesterolemia, unspecified: Secondary | ICD-10-CM | POA: Diagnosis not present

## 2021-06-06 DIAGNOSIS — D500 Iron deficiency anemia secondary to blood loss (chronic): Secondary | ICD-10-CM | POA: Diagnosis not present

## 2021-06-06 DIAGNOSIS — F4321 Adjustment disorder with depressed mood: Secondary | ICD-10-CM | POA: Diagnosis not present

## 2021-06-06 DIAGNOSIS — K219 Gastro-esophageal reflux disease without esophagitis: Secondary | ICD-10-CM | POA: Diagnosis not present

## 2021-06-22 DIAGNOSIS — E1165 Type 2 diabetes mellitus with hyperglycemia: Secondary | ICD-10-CM | POA: Diagnosis not present

## 2021-06-22 DIAGNOSIS — E1169 Type 2 diabetes mellitus with other specified complication: Secondary | ICD-10-CM | POA: Diagnosis not present

## 2021-06-22 DIAGNOSIS — E1122 Type 2 diabetes mellitus with diabetic chronic kidney disease: Secondary | ICD-10-CM | POA: Diagnosis not present

## 2021-06-22 DIAGNOSIS — I5030 Unspecified diastolic (congestive) heart failure: Secondary | ICD-10-CM | POA: Diagnosis not present

## 2021-06-22 DIAGNOSIS — I2510 Atherosclerotic heart disease of native coronary artery without angina pectoris: Secondary | ICD-10-CM | POA: Diagnosis not present

## 2021-06-22 DIAGNOSIS — I10 Essential (primary) hypertension: Secondary | ICD-10-CM | POA: Diagnosis not present

## 2021-06-22 DIAGNOSIS — N1832 Chronic kidney disease, stage 3b: Secondary | ICD-10-CM | POA: Diagnosis not present

## 2021-06-22 DIAGNOSIS — D509 Iron deficiency anemia, unspecified: Secondary | ICD-10-CM | POA: Diagnosis not present

## 2021-06-22 DIAGNOSIS — C61 Malignant neoplasm of prostate: Secondary | ICD-10-CM | POA: Diagnosis not present

## 2021-06-22 DIAGNOSIS — F4321 Adjustment disorder with depressed mood: Secondary | ICD-10-CM | POA: Diagnosis not present

## 2021-06-22 DIAGNOSIS — K219 Gastro-esophageal reflux disease without esophagitis: Secondary | ICD-10-CM | POA: Diagnosis not present

## 2021-06-22 DIAGNOSIS — E7800 Pure hypercholesterolemia, unspecified: Secondary | ICD-10-CM | POA: Diagnosis not present

## 2021-07-13 ENCOUNTER — Other Ambulatory Visit: Payer: Self-pay

## 2021-07-13 ENCOUNTER — Emergency Department (HOSPITAL_COMMUNITY): Payer: PPO

## 2021-07-13 ENCOUNTER — Emergency Department (HOSPITAL_COMMUNITY)
Admission: EM | Admit: 2021-07-13 | Discharge: 2021-07-13 | Disposition: A | Discharge status: Home / Self Care | Payer: PPO | Attending: Emergency Medicine | Admitting: Emergency Medicine

## 2021-07-13 DIAGNOSIS — Z7984 Long term (current) use of oral hypoglycemic drugs: Secondary | ICD-10-CM | POA: Insufficient documentation

## 2021-07-13 DIAGNOSIS — E860 Dehydration: Secondary | ICD-10-CM | POA: Diagnosis not present

## 2021-07-13 DIAGNOSIS — R42 Dizziness and giddiness: Secondary | ICD-10-CM | POA: Diagnosis not present

## 2021-07-13 DIAGNOSIS — I2510 Atherosclerotic heart disease of native coronary artery without angina pectoris: Secondary | ICD-10-CM | POA: Diagnosis not present

## 2021-07-13 DIAGNOSIS — I129 Hypertensive chronic kidney disease with stage 1 through stage 4 chronic kidney disease, or unspecified chronic kidney disease: Secondary | ICD-10-CM | POA: Insufficient documentation

## 2021-07-13 DIAGNOSIS — N189 Chronic kidney disease, unspecified: Secondary | ICD-10-CM | POA: Diagnosis not present

## 2021-07-13 DIAGNOSIS — E1122 Type 2 diabetes mellitus with diabetic chronic kidney disease: Secondary | ICD-10-CM | POA: Insufficient documentation

## 2021-07-13 DIAGNOSIS — Z79899 Other long term (current) drug therapy: Secondary | ICD-10-CM | POA: Insufficient documentation

## 2021-07-13 DIAGNOSIS — Z7982 Long term (current) use of aspirin: Secondary | ICD-10-CM | POA: Diagnosis not present

## 2021-07-13 DIAGNOSIS — R600 Localized edema: Secondary | ICD-10-CM | POA: Diagnosis not present

## 2021-07-13 DIAGNOSIS — I959 Hypotension, unspecified: Secondary | ICD-10-CM | POA: Diagnosis not present

## 2021-07-13 DIAGNOSIS — I700 Atherosclerosis of aorta: Secondary | ICD-10-CM | POA: Diagnosis not present

## 2021-07-13 LAB — TROPONIN I (HIGH SENSITIVITY)
Troponin I (High Sensitivity): 18 ng/L — ABNORMAL HIGH (ref <18)
Troponin I (High Sensitivity): 15 ng/L (ref <18)

## 2021-07-13 LAB — CBC WITH DIFFERENTIAL/PLATELET
Abs Immature Granulocytes: 0.01 10*3/uL (ref 0.00–0.07)
Basophils Absolute: 0 10*3/uL (ref 0.0–0.1)
Basophils Relative: 0 %
Eosinophils Absolute: 0.1 10*3/uL (ref 0.0–0.5)
Eosinophils Relative: 2 %
HCT: 38.1 % — ABNORMAL LOW (ref 39.0–52.0)
Hemoglobin: 12.6 g/dL — ABNORMAL LOW (ref 13.0–17.0)
Immature Granulocytes: 0 %
Lymphocytes Relative: 23 %
Lymphs Abs: 1.4 10*3/uL (ref 0.7–4.0)
MCH: 30.4 pg (ref 26.0–34.0)
MCHC: 33.1 g/dL (ref 30.0–36.0)
MCV: 92 fL (ref 80.0–100.0)
Monocytes Absolute: 0.5 10*3/uL (ref 0.1–1.0)
Monocytes Relative: 8 %
Neutro Abs: 3.8 10*3/uL (ref 1.7–7.7)
Neutrophils Relative %: 67 %
Platelets: 96 10*3/uL — ABNORMAL LOW (ref 150–400)
RBC: 4.14 MIL/uL — ABNORMAL LOW (ref 4.22–5.81)
RDW: 13.5 % (ref 11.5–15.5)
WBC: 5.8 10*3/uL (ref 4.0–10.5)
nRBC: 0 % (ref 0.0–0.2)

## 2021-07-13 LAB — BASIC METABOLIC PANEL
Anion gap: 9 (ref 5–15)
BUN: 54 mg/dL — ABNORMAL HIGH (ref 8–23)
CO2: 23 mmol/L (ref 22–32)
Calcium: 9.6 mg/dL (ref 8.9–10.3)
Chloride: 102 mmol/L (ref 98–111)
Creatinine, Ser: 2.44 mg/dL — ABNORMAL HIGH (ref 0.61–1.24)
GFR, Estimated: 25 mL/min — ABNORMAL LOW (ref >60)
Glucose, Bld: 134 mg/dL — ABNORMAL HIGH (ref 70–99)
Potassium: 4.2 mmol/L (ref 3.5–5.1)
Sodium: 134 mmol/L — ABNORMAL LOW (ref 135–145)

## 2021-07-13 MED ORDER — SODIUM CHLORIDE 0.9 % IV BOLUS (SEPSIS)
500.0000 mL | Freq: Once | INTRAVENOUS | Status: AC
  Administered 2021-07-13: 500 mL via INTRAVENOUS

## 2021-07-13 MED ORDER — SODIUM CHLORIDE 0.9 % IV SOLN: 1000.0000 mL | INTRAVENOUS | Status: DC

## 2021-07-13 NOTE — ED Triage Notes (Signed)
Patient states that he has had hypotension during the day today.  Patient did have some dizziness with the hypotension.  No chest pain no shortness of breath, nausea or vomiting.

## 2021-07-13 NOTE — Discharge Instructions (Signed)
Continue your current medications.  Stay well-hydrated.  Follow-up with your doctor to be rechecked.  Return as needed for worsening or recurrent symptoms

## 2021-07-13 NOTE — ED Provider Notes (Signed)
[facility] EMERGENCY DEPARTMENT Provider Note   CSN: [id number] Arrival date & time: 07/13/21  1900     History  Chief Complaint  Patient presents with   Hypotension    Ryan Hunt is a 88 y.o. male.  HPI  Patient presents the emergency room for evaluation of low blood pressure.  Patient has a history of hypertension, hypercholesterolemia, diabetes, coronary artery disease and congestive heart failure.  Patient states he took his medications as regularly prescribed today.  He has not had any recent changes in his medications.  He regular checks his blood pressure and was started to feel somewhat lightheaded.  When he checked it he noticed his blood pressure was in the high 90s.  He is not having any trouble with chest pain.  He is not having any shortness of breath.  He is not have any vomiting or diarrhea.  No dysuria.  Patient is feeling better now.  Home Medications Prior to Admission medications   Medication Sig Start Date End Date Taking? Authorizing Provider  amLODipine (NORVASC) 10 MG tablet TAKE 1 TABLET DAILY 05/17/21   Belva Crome, MD  Aromatic Inhalants (VICKS VAPOINHALER IN) Inhale 1 puff into the lungs at bedtime.    [provider]  aspirin EC 81 MG tablet Take 1 tablet (81 mg total) by mouth daily. 11/26/16   Belva Crome, MD  Continuous Blood Gluc Receiver (FREESTYLE LIBRE 14 DAY READER) DEVI SMARTSIG:1 Patch(s) Topical As Directed 08/19/20   [provider]  Continuous Blood Gluc Sensor (FREESTYLE LIBRE 14 DAY SENSOR) MISC Apply topically. 01/28/21   [provider]  docusate sodium (COLACE) 250 MG capsule Take 250 mg by mouth at bedtime.    [provider]  empagliflozin (JARDIANCE) 10 MG TABS tablet Take 2 tablets by mouth daily.    [provider]  fluorometholone (FLAREX) 0.1 % ophthalmic suspension Place 1 drop into both eyes 3 (three) times daily as needed (eye floaters).    [provider]  furosemide (LASIX) 40 MG tablet Take 40 mg by mouth 2 (two) times daily.     [provider]  isosorbide mononitrate (IMDUR) 30 MG 24 hr tablet Take 30 mg by mouth daily.    [provider]  LANTUS SOLOSTAR 100 UNIT/ML Solostar Pen Inject 35 Units into the skin daily. 01/29/19   [provider]  nitroGLYCERIN (NITROSTAT) 0.4 MG SL tablet Place 0.4 mg under the tongue every 5 (five) minutes as needed for chest pain.     [provider]  ONE TOUCH ULTRA TEST test strip  07/21/18   [provider]  rosuvastatin (CRESTOR) 10 MG tablet Take 10 mg by mouth daily.    [provider]  telmisartan (MICARDIS) 80 MG tablet Take 1 tablet (80 mg total) by mouth daily. 03/25/18   Tanda Rockers, MD  traMADol (ULTRAM) 50 MG tablet Take 50 mg by mouth 2 (two) times daily as needed for moderate pain (arthritis back pain.).     [provider]  vitamin B-12 (CYANOCOBALAMIN) 1000 MCG tablet Take 1,000 mcg by mouth at bedtime.    [provider]  Vitamin D3 (VITAMIN D) 25 MCG tablet Take 1,000 Units by mouth daily.    [provider]      Allergies    Amoxicillin, Oxycodone hcl, and Simvastatin    Review of Systems   Review of Systems  Physical Exam Updated Vital Signs BP (!) 133/50  Pulse 60    Temp 98 F (36.7 C) (Oral)    Resp (!) 21    SpO2 96%  Physical Exam Vitals and nursing note reviewed.  Constitutional:      General: He is not in acute distress.    Appearance: He is well-developed.  HENT:     Head: Normocephalic and atraumatic.     Right Ear: External ear normal.     Left Ear: External ear normal.  Eyes:     General: No scleral icterus.       Right eye: No discharge.        Left eye: No discharge.     Conjunctiva/sclera: Conjunctivae normal.  Neck:     Trachea: No tracheal deviation.  Cardiovascular:     Rate and Rhythm: Normal rate and regular rhythm.  Pulmonary:     Effort: Pulmonary effort is  normal. No respiratory distress.     Breath sounds: Normal breath sounds. No stridor. No wheezing or rales.  Abdominal:     General: Bowel sounds are normal. There is no distension.     Palpations: Abdomen is soft.     Tenderness: There is no abdominal tenderness. There is no guarding or rebound.  Musculoskeletal:        General: No tenderness or deformity.     Cervical back: Neck supple.     Right lower leg: Edema present.     Left lower leg: Edema present.  Skin:    General: Skin is warm and dry.     Findings: No rash.  Neurological:     General: No focal deficit present.     Mental Status: He is alert.     Cranial Nerves: No cranial nerve deficit (no facial droop, extraocular movements intact, no slurred speech).     Sensory: No sensory deficit.     Motor: No abnormal muscle tone or seizure activity.     Coordination: Coordination normal.  Psychiatric:        Mood and Affect: Mood normal.    ED Results / Procedures / Treatments   Labs (all labs ordered are listed, but only abnormal results are displayed) Labs Reviewed  BASIC METABOLIC PANEL - Abnormal; Notable for the following components:      Result Value   Sodium 134 (*)    Glucose, Bld 134 (*)    BUN 54 (*)    Creatinine, Ser 2.44 (*)    GFR, Estimated 25 (*)    All other components within normal limits  CBC WITH DIFFERENTIAL/PLATELET - Abnormal; Notable for the following components:   RBC 4.14 (*)    Hemoglobin 12.6 (*)    HCT 38.1 (*)    Platelets 96 (*)    All other components within normal limits  TROPONIN I (HIGH SENSITIVITY) - Abnormal; Notable for the following components:   Troponin I (High Sensitivity) 18 (*)    All other components within normal limits  TROPONIN I (HIGH SENSITIVITY)    EKG EKG Interpretation  Date/Time:  Thursday July 13 2021 19:25:11 EST Ventricular Rate:  61 PR Interval:    QRS Duration: 134 QT Interval:  450 QTC Calculation: 453 R Axis:   43 Text Interpretation: sinus  rhythm with first degree av block Left bundle branch block Cannot rule out Anterior infarct , age undetermined T wave abnormality, consider lateral ischemia Abnormal ECG , new since last ecg When compared with ECG of 28-Feb-2018 05:03, Confirmed by Dorie Rank [phone]) on 07/13/2021 11:02:11 PM  Radiology DG Chest 2 View  Result Date: 07/13/2021 CLINICAL DATA:  Dizziness and hypotension. EXAM: CHEST - 2 VIEW COMPARISON:  May 08, 2018 FINDINGS: The heart size and mediastinal contours are within normal limits. There is moderate severity calcification of the aortic arch. Both lungs are clear. The visualized skeletal structures are unremarkable. IMPRESSION: No active cardiopulmonary disease. Electronically Signed   By: Virgina Norfolk M.D.   On: 07/13/2021 20:34    Procedures .1-3 Lead EKG Interpretation Performed by: Dorie Rank, MD Authorized by: Dorie Rank, MD     Interpretation: normal     ECG rate:  61   ECG rate assessment: normal     Rhythm: sinus rhythm     Ectopy: none   Comments:     11:32 PM     Medications Ordered in ED Medications  sodium chloride 0.9 % bolus 500 mL (500 mLs Intravenous New Bag/Given 07/13/21 2232)    Followed by  0.9 %  sodium chloride infusion (has no administration in time range)    ED Course/ Medical Decision Making/ A&P Clinical Course as of 07/13/21 2333  Thu Jul 13, 2021  2136 CBC shows hemoglobin 12.6 but this is similar to previous values [JK]  9381 Basic metabolic panel(!) Creatinine increased at 2.44, previous values 2.08 [JK]  2137 Initial troponin elevated 18 [JK]  2302 Chest x-ray images and report reviewed by me.  No acute findings. [JK]  2324 Repeat troponin 15 [JK]    Clinical Course User Index [JK] Dorie Rank, MD                           Medical Decision Making  Patient presented to the ED for evaluation of low blood pressure.  Patient was feeling lightheaded earlier but was not having any trouble with chest pain shortness of  breath or fever.  He has not had any vomiting or diarrhea.  ED work-up is overall reassuring.  Initial troponin was slightly increased but on repeat was normal.  I doubt this is clinically significant.  Patient did have a slight increase in his creatinine compared to his baseline.  Suspect he might of had a mild component of dehydration.  Patient was given IV fluids.  He is feeling better now and is ready for discharge.        Final Clinical Impression(s) / ED Diagnoses Final diagnoses:  Dehydration  Chronic kidney disease, unspecified CKD stage    Rx / DC Orders ED Discharge Orders     None         Dorie Rank, MD 07/13/21 2333

## 2021-07-13 NOTE — ED Provider Triage Note (Signed)
Emergency Medicine Provider Triage Evaluation Note  Ryan Hunt , a 88 y.o. male  was evaluated in triage.  Pt complains of hypotension.  Patient states that around 530 today he had sudden onset of lightheadedness.  He states that he initially thought it may be his blood sugar so he took that but it was 130.  He states that he then took his blood pressure and it was 95/49.  He states that he retook it a few times and it was similar to that so he called the telephone nurse who instructed him to present to the emergency department within the hour.  He denies any chest pain, palpitations, syncope.  He has been eating and drinking appropriately.  Denies nausea or vomiting.  Denies melena or hematochezia.  Denies any other symptoms before today.  No recent changes to his blood pressure medication.  Review of Systems  Positive: See above Negative:   Physical Exam  BP (!) 135/59 (BP Location: Right Arm)    Pulse 64    Temp 97.7 F (36.5 C) (Oral)    Resp 17    SpO2 99%  Gen:   Awake, no distress   Resp:  Normal effort  MSK:   Moves extremities without difficulty  Other:  T7/D2 with systolic murmur.  Medical Decision Making  Medically screening exam initiated at 7:34 PM.  Appropriate orders placed.  ARN MCOMBER was informed that the remainder of the evaluation will be completed by another provider, this initial triage assessment does not replace that evaluation, and the importance of remaining in the ED until their evaluation is complete.     Mickie Hillier, PA-C 07/13/21 1936

## 2021-07-13 NOTE — ED Notes (Signed)
Discharge instructions reviewed with patient and family. Patient and family verbalized understanding of instructions. Follow-up care and medications were reviewed. Patient ambulatory with steady gait. VSS upon discharge.

## 2021-07-17 DIAGNOSIS — M479 Spondylosis, unspecified: Secondary | ICD-10-CM | POA: Diagnosis not present

## 2021-07-17 DIAGNOSIS — E785 Hyperlipidemia, unspecified: Secondary | ICD-10-CM | POA: Diagnosis not present

## 2021-07-17 DIAGNOSIS — I5030 Unspecified diastolic (congestive) heart failure: Secondary | ICD-10-CM | POA: Diagnosis not present

## 2021-07-17 DIAGNOSIS — Z955 Presence of coronary angioplasty implant and graft: Secondary | ICD-10-CM | POA: Diagnosis not present

## 2021-07-17 DIAGNOSIS — N183 Chronic kidney disease, stage 3 unspecified: Secondary | ICD-10-CM | POA: Diagnosis not present

## 2021-07-17 DIAGNOSIS — M170 Bilateral primary osteoarthritis of knee: Secondary | ICD-10-CM | POA: Diagnosis not present

## 2021-07-17 DIAGNOSIS — Z794 Long term (current) use of insulin: Secondary | ICD-10-CM | POA: Diagnosis not present

## 2021-07-17 DIAGNOSIS — Z87891 Personal history of nicotine dependence: Secondary | ICD-10-CM | POA: Diagnosis not present

## 2021-07-17 DIAGNOSIS — I25118 Atherosclerotic heart disease of native coronary artery with other forms of angina pectoris: Secondary | ICD-10-CM | POA: Diagnosis not present

## 2021-07-17 DIAGNOSIS — E1122 Type 2 diabetes mellitus with diabetic chronic kidney disease: Secondary | ICD-10-CM | POA: Diagnosis not present

## 2021-07-17 DIAGNOSIS — Z7984 Long term (current) use of oral hypoglycemic drugs: Secondary | ICD-10-CM | POA: Diagnosis not present

## 2021-07-17 DIAGNOSIS — Z6833 Body mass index (BMI) 33.0-33.9, adult: Secondary | ICD-10-CM | POA: Diagnosis not present

## 2021-07-21 DIAGNOSIS — I10 Essential (primary) hypertension: Secondary | ICD-10-CM | POA: Diagnosis not present

## 2021-08-03 DIAGNOSIS — E1169 Type 2 diabetes mellitus with other specified complication: Secondary | ICD-10-CM | POA: Diagnosis not present

## 2021-08-03 DIAGNOSIS — C61 Malignant neoplasm of prostate: Secondary | ICD-10-CM | POA: Diagnosis not present

## 2021-08-03 DIAGNOSIS — E1122 Type 2 diabetes mellitus with diabetic chronic kidney disease: Secondary | ICD-10-CM | POA: Diagnosis not present

## 2021-08-03 DIAGNOSIS — D500 Iron deficiency anemia secondary to blood loss (chronic): Secondary | ICD-10-CM | POA: Diagnosis not present

## 2021-08-03 DIAGNOSIS — I10 Essential (primary) hypertension: Secondary | ICD-10-CM | POA: Diagnosis not present

## 2021-08-03 DIAGNOSIS — E7800 Pure hypercholesterolemia, unspecified: Secondary | ICD-10-CM | POA: Diagnosis not present

## 2021-08-03 DIAGNOSIS — I5030 Unspecified diastolic (congestive) heart failure: Secondary | ICD-10-CM | POA: Diagnosis not present

## 2021-08-03 DIAGNOSIS — K219 Gastro-esophageal reflux disease without esophagitis: Secondary | ICD-10-CM | POA: Diagnosis not present

## 2021-08-03 DIAGNOSIS — I2510 Atherosclerotic heart disease of native coronary artery without angina pectoris: Secondary | ICD-10-CM | POA: Diagnosis not present

## 2021-08-03 DIAGNOSIS — E1165 Type 2 diabetes mellitus with hyperglycemia: Secondary | ICD-10-CM | POA: Diagnosis not present

## 2021-08-03 DIAGNOSIS — F4321 Adjustment disorder with depressed mood: Secondary | ICD-10-CM | POA: Diagnosis not present

## 2021-08-03 DIAGNOSIS — N1832 Chronic kidney disease, stage 3b: Secondary | ICD-10-CM | POA: Diagnosis not present

## 2021-08-10 DIAGNOSIS — Z7984 Long term (current) use of oral hypoglycemic drugs: Secondary | ICD-10-CM | POA: Diagnosis not present

## 2021-08-10 DIAGNOSIS — Z8546 Personal history of malignant neoplasm of prostate: Secondary | ICD-10-CM | POA: Diagnosis not present

## 2021-08-10 DIAGNOSIS — I2510 Atherosclerotic heart disease of native coronary artery without angina pectoris: Secondary | ICD-10-CM | POA: Diagnosis not present

## 2021-08-10 DIAGNOSIS — E7800 Pure hypercholesterolemia, unspecified: Secondary | ICD-10-CM | POA: Diagnosis not present

## 2021-08-10 DIAGNOSIS — I5030 Unspecified diastolic (congestive) heart failure: Secondary | ICD-10-CM | POA: Diagnosis not present

## 2021-08-10 DIAGNOSIS — N1832 Chronic kidney disease, stage 3b: Secondary | ICD-10-CM | POA: Diagnosis not present

## 2021-08-10 DIAGNOSIS — Z794 Long term (current) use of insulin: Secondary | ICD-10-CM | POA: Diagnosis not present

## 2021-08-10 DIAGNOSIS — I10 Essential (primary) hypertension: Secondary | ICD-10-CM | POA: Diagnosis not present

## 2021-08-10 DIAGNOSIS — E1122 Type 2 diabetes mellitus with diabetic chronic kidney disease: Secondary | ICD-10-CM | POA: Diagnosis not present

## 2021-08-10 DIAGNOSIS — I447 Left bundle-branch block, unspecified: Secondary | ICD-10-CM | POA: Diagnosis not present

## 2021-08-11 DIAGNOSIS — E1122 Type 2 diabetes mellitus with diabetic chronic kidney disease: Secondary | ICD-10-CM | POA: Diagnosis not present

## 2021-08-11 DIAGNOSIS — Z794 Long term (current) use of insulin: Secondary | ICD-10-CM | POA: Diagnosis not present

## 2021-08-11 DIAGNOSIS — I25118 Atherosclerotic heart disease of native coronary artery with other forms of angina pectoris: Secondary | ICD-10-CM | POA: Diagnosis not present

## 2021-08-11 DIAGNOSIS — M479 Spondylosis, unspecified: Secondary | ICD-10-CM | POA: Diagnosis not present

## 2021-08-11 DIAGNOSIS — M1711 Unilateral primary osteoarthritis, right knee: Secondary | ICD-10-CM | POA: Diagnosis not present

## 2021-08-11 DIAGNOSIS — Z955 Presence of coronary angioplasty implant and graft: Secondary | ICD-10-CM | POA: Diagnosis not present

## 2021-08-11 DIAGNOSIS — Z7984 Long term (current) use of oral hypoglycemic drugs: Secondary | ICD-10-CM | POA: Diagnosis not present

## 2021-08-11 DIAGNOSIS — E785 Hyperlipidemia, unspecified: Secondary | ICD-10-CM | POA: Diagnosis not present

## 2021-08-11 DIAGNOSIS — I5030 Unspecified diastolic (congestive) heart failure: Secondary | ICD-10-CM | POA: Diagnosis not present

## 2021-08-11 DIAGNOSIS — N184 Chronic kidney disease, stage 4 (severe): Secondary | ICD-10-CM | POA: Diagnosis not present

## 2021-08-11 DIAGNOSIS — E1165 Type 2 diabetes mellitus with hyperglycemia: Secondary | ICD-10-CM | POA: Diagnosis not present

## 2021-08-11 DIAGNOSIS — Z96652 Presence of left artificial knee joint: Secondary | ICD-10-CM | POA: Diagnosis not present

## 2021-09-20 DIAGNOSIS — I10 Essential (primary) hypertension: Secondary | ICD-10-CM | POA: Diagnosis not present

## 2021-09-20 DIAGNOSIS — E1165 Type 2 diabetes mellitus with hyperglycemia: Secondary | ICD-10-CM | POA: Diagnosis not present

## 2021-09-20 DIAGNOSIS — I2510 Atherosclerotic heart disease of native coronary artery without angina pectoris: Secondary | ICD-10-CM | POA: Diagnosis not present

## 2021-11-07 DIAGNOSIS — Z794 Long term (current) use of insulin: Secondary | ICD-10-CM | POA: Diagnosis not present

## 2021-11-07 DIAGNOSIS — N183 Chronic kidney disease, stage 3 unspecified: Secondary | ICD-10-CM | POA: Diagnosis not present

## 2021-11-07 DIAGNOSIS — E1122 Type 2 diabetes mellitus with diabetic chronic kidney disease: Secondary | ICD-10-CM | POA: Diagnosis not present

## 2021-11-07 DIAGNOSIS — Z7984 Long term (current) use of oral hypoglycemic drugs: Secondary | ICD-10-CM | POA: Diagnosis not present

## 2021-11-07 DIAGNOSIS — N2581 Secondary hyperparathyroidism of renal origin: Secondary | ICD-10-CM | POA: Diagnosis not present

## 2021-11-29 DIAGNOSIS — M65331 Trigger finger, right middle finger: Secondary | ICD-10-CM | POA: Diagnosis not present

## 2021-11-30 DIAGNOSIS — L570 Actinic keratosis: Secondary | ICD-10-CM | POA: Diagnosis not present

## 2021-11-30 DIAGNOSIS — L821 Other seborrheic keratosis: Secondary | ICD-10-CM | POA: Diagnosis not present

## 2021-11-30 DIAGNOSIS — L820 Inflamed seborrheic keratosis: Secondary | ICD-10-CM | POA: Diagnosis not present

## 2021-11-30 DIAGNOSIS — X32XXXA Exposure to sunlight, initial encounter: Secondary | ICD-10-CM | POA: Diagnosis not present

## 2021-12-29 DIAGNOSIS — M65331 Trigger finger, right middle finger: Secondary | ICD-10-CM | POA: Diagnosis not present

## 2022-01-31 DIAGNOSIS — E7800 Pure hypercholesterolemia, unspecified: Secondary | ICD-10-CM | POA: Diagnosis not present

## 2022-01-31 DIAGNOSIS — I10 Essential (primary) hypertension: Secondary | ICD-10-CM | POA: Diagnosis not present

## 2022-01-31 DIAGNOSIS — E1165 Type 2 diabetes mellitus with hyperglycemia: Secondary | ICD-10-CM | POA: Diagnosis not present

## 2022-01-31 DIAGNOSIS — I2510 Atherosclerotic heart disease of native coronary artery without angina pectoris: Secondary | ICD-10-CM | POA: Diagnosis not present

## 2022-02-15 DIAGNOSIS — Z8546 Personal history of malignant neoplasm of prostate: Secondary | ICD-10-CM | POA: Diagnosis not present

## 2022-02-15 DIAGNOSIS — I5030 Unspecified diastolic (congestive) heart failure: Secondary | ICD-10-CM | POA: Diagnosis not present

## 2022-02-15 DIAGNOSIS — E7800 Pure hypercholesterolemia, unspecified: Secondary | ICD-10-CM | POA: Diagnosis not present

## 2022-02-15 DIAGNOSIS — N1832 Chronic kidney disease, stage 3b: Secondary | ICD-10-CM | POA: Diagnosis not present

## 2022-02-15 DIAGNOSIS — I2510 Atherosclerotic heart disease of native coronary artery without angina pectoris: Secondary | ICD-10-CM | POA: Diagnosis not present

## 2022-02-15 DIAGNOSIS — E1122 Type 2 diabetes mellitus with diabetic chronic kidney disease: Secondary | ICD-10-CM | POA: Diagnosis not present

## 2022-02-15 DIAGNOSIS — R7989 Other specified abnormal findings of blood chemistry: Secondary | ICD-10-CM | POA: Diagnosis not present

## 2022-02-15 DIAGNOSIS — Z0000 Encounter for general adult medical examination without abnormal findings: Secondary | ICD-10-CM | POA: Diagnosis not present

## 2022-02-15 DIAGNOSIS — I447 Left bundle-branch block, unspecified: Secondary | ICD-10-CM | POA: Diagnosis not present

## 2022-02-15 DIAGNOSIS — Z1331 Encounter for screening for depression: Secondary | ICD-10-CM | POA: Diagnosis not present

## 2022-02-15 DIAGNOSIS — I10 Essential (primary) hypertension: Secondary | ICD-10-CM | POA: Diagnosis not present

## 2022-02-20 DIAGNOSIS — E1165 Type 2 diabetes mellitus with hyperglycemia: Secondary | ICD-10-CM | POA: Diagnosis not present

## 2022-02-20 DIAGNOSIS — I2510 Atherosclerotic heart disease of native coronary artery without angina pectoris: Secondary | ICD-10-CM | POA: Diagnosis not present

## 2022-02-20 DIAGNOSIS — I10 Essential (primary) hypertension: Secondary | ICD-10-CM | POA: Diagnosis not present

## 2022-02-27 DIAGNOSIS — R7989 Other specified abnormal findings of blood chemistry: Secondary | ICD-10-CM | POA: Diagnosis not present

## 2022-04-19 DIAGNOSIS — E785 Hyperlipidemia, unspecified: Secondary | ICD-10-CM | POA: Diagnosis not present

## 2022-04-19 DIAGNOSIS — N184 Chronic kidney disease, stage 4 (severe): Secondary | ICD-10-CM | POA: Diagnosis not present

## 2022-04-19 DIAGNOSIS — D631 Anemia in chronic kidney disease: Secondary | ICD-10-CM | POA: Diagnosis not present

## 2022-04-19 DIAGNOSIS — N2581 Secondary hyperparathyroidism of renal origin: Secondary | ICD-10-CM | POA: Diagnosis not present

## 2022-04-19 DIAGNOSIS — I130 Hypertensive heart and chronic kidney disease with heart failure and stage 1 through stage 4 chronic kidney disease, or unspecified chronic kidney disease: Secondary | ICD-10-CM | POA: Diagnosis not present

## 2022-04-19 DIAGNOSIS — I509 Heart failure, unspecified: Secondary | ICD-10-CM | POA: Diagnosis not present

## 2022-04-23 ENCOUNTER — Other Ambulatory Visit: Payer: Self-pay | Admitting: Nephrology

## 2022-04-23 DIAGNOSIS — N184 Chronic kidney disease, stage 4 (severe): Secondary | ICD-10-CM

## 2022-04-25 ENCOUNTER — Ambulatory Visit
Admission: RE | Admit: 2022-04-25 | Discharge: 2022-04-25 | Disposition: A | Discharge status: Home / Self Care | Payer: PPO | Source: Ambulatory Visit | Attending: Nephrology | Admitting: Nephrology

## 2022-04-25 DIAGNOSIS — N281 Cyst of kidney, acquired: Secondary | ICD-10-CM | POA: Diagnosis not present

## 2022-04-25 DIAGNOSIS — N189 Chronic kidney disease, unspecified: Secondary | ICD-10-CM | POA: Diagnosis not present

## 2022-04-25 DIAGNOSIS — N184 Chronic kidney disease, stage 4 (severe): Secondary | ICD-10-CM

## 2022-05-02 DIAGNOSIS — E7800 Pure hypercholesterolemia, unspecified: Secondary | ICD-10-CM | POA: Diagnosis not present

## 2022-05-02 DIAGNOSIS — E1165 Type 2 diabetes mellitus with hyperglycemia: Secondary | ICD-10-CM | POA: Diagnosis not present

## 2022-05-02 DIAGNOSIS — I2510 Atherosclerotic heart disease of native coronary artery without angina pectoris: Secondary | ICD-10-CM | POA: Diagnosis not present

## 2022-05-02 DIAGNOSIS — K219 Gastro-esophageal reflux disease without esophagitis: Secondary | ICD-10-CM | POA: Diagnosis not present

## 2022-05-02 DIAGNOSIS — I10 Essential (primary) hypertension: Secondary | ICD-10-CM | POA: Diagnosis not present

## 2022-05-02 DIAGNOSIS — N1832 Chronic kidney disease, stage 3b: Secondary | ICD-10-CM | POA: Diagnosis not present

## 2022-05-03 ENCOUNTER — Ambulatory Visit: Payer: PPO | Attending: Physician Assistant | Admitting: Physician Assistant

## 2022-05-03 ENCOUNTER — Encounter: Payer: Self-pay | Admitting: Physician Assistant

## 2022-05-03 VITALS — BP 108/58 | HR 51 | Ht 68.5 in | Wt 216.0 lb

## 2022-05-03 DIAGNOSIS — I447 Left bundle-branch block, unspecified: Secondary | ICD-10-CM | POA: Diagnosis not present

## 2022-05-03 DIAGNOSIS — I2510 Atherosclerotic heart disease of native coronary artery without angina pectoris: Secondary | ICD-10-CM | POA: Diagnosis not present

## 2022-05-03 DIAGNOSIS — I10 Essential (primary) hypertension: Secondary | ICD-10-CM

## 2022-05-03 DIAGNOSIS — E7800 Pure hypercholesterolemia, unspecified: Secondary | ICD-10-CM

## 2022-05-03 NOTE — Progress Notes (Signed)
Cardiology Office Note:    Date:  05/03/2022   ID:  Ryan Hunt, DOB [birth year], MRN [id number]  PCP:  Seward Carol, MD  [facility] Cardiologist:  Sinclair Grooms, MD  [place] Electrophysiologist:  None   Chief Complaint: Yearly follow-up  History of Present Illness:    Ryan Hunt is a 88 y.o. male with a hx of coronary artery disease s/p stenting to circumflex in 1950, chronic diastolic heart failure, chronic dyspnea on exertion, diabetes mellitus, CKD IV, hypertension, bradycardia on beta-blocker and hyperlipidemia seen for yearly follow-up.  Admitted 02/2018 for USA/DOE. Cath with significant ostial diagonal disease but not amenable for PCI due to small size. recommended medical therapy. Also symptoms of dyspnea are out of proportion to the degree of coronary disease.   Echocardiogram in 2021 with LV function of 65 to 70%, moderate concentric LVH and grade 1 diastolic dysfunction. Narrow LV outflow tract with peak  gradient 22 mmHg.   Patient is here for follow-up.  He was taken off aspirin by PCP last month.  He does not remember reason.  Establish care with nephrologist due to worsening renal function.  He was never told to have any blood in your stool or urine.  Patient denies any of this.  No chest pain, shortness of breath, orthopnea, PND, syncope, melena.  Stable lower extremity edema.  Past Medical History:  Diagnosis Date   Anemia    Anginal pain ([place])    Arthritis    "knees"   Bronchitis 11/2011   "first time for me"   CAD (coronary artery disease)    With DES circumflex 02/2009   CHF (congestive heart failure) (HCC)    Coronary atherosclerosis of native coronary artery    Diabetes mellitus with kidney disease (HCC)    Diastolic heart failure ([place])    Diverticulosis of colon (without mention of hemorrhage)    Dyspnea    Exertional dyspnea    Chronic   GERD (gastroesophageal reflux disease)    History of kidney stones    History of peptic  ulcer disease    Remote history   Hypercholesterolemia    Hypertension    Kidney stone    "dr told me my kidney was loaded w/stones; imbedded"   Labyrinthitis    Leg edema    Meralgia paresthetica    Mixed hyperlipidemia    Osteoarthrosis, unspecified whether generalized or localized, lower leg    Palpitations    Prostate cancer ([place])    s/p prostatectomy   Type II diabetes mellitus ([place])    dx 3 yrs ago    Past Surgical History:  Procedure Laterality Date   BACK SURGERY     BIOPSY  04/09/2019   Procedure: BIOPSY;  Surgeon: Laurence Spates, MD;  Location: [facility];  Service: Endoscopy;;   CARDIAC CATHETERIZATION     CATARACT EXTRACTION W/ INTRAOCULAR LENS IMPLANT & ANTERIOR VITRECTOMY, BILATERAL  1990's   COLONOSCOPY     COLONOSCOPY WITH PROPOFOL N/A 04/09/2019   Procedure: COLONOSCOPY WITH PROPOFOL;  Surgeon: Laurence Spates, MD;  [facility];  Service: Endoscopy;  Laterality: N/A;   COLONOSCOPY WITH PROPOFOL N/A 08/31/2019   Procedure: COLONOSCOPY WITH PROPOFOL;  Surgeon: Otis Brace, MD;  [facility];  Service: Gastroenterology;  Laterality: N/A;   COLONOSCOPY WITH PROPOFOL N/A 03/29/2020   Procedure: COLONOSCOPY WITH PROPOFOL;  Surgeon: Otis Brace, MD;  [facility];  Service: Gastroenterology;  Laterality: N/A;   CORONARY ANGIOPLASTY WITH STENT PLACEMENT  02/2009   Stent x 1 (DES circumflex)   DIAGNOSTIC LAPAROSCOPY     ESOPHAGOGASTRODUODENOSCOPY (EGD) WITH PROPOFOL N/A 04/09/2019   Procedure: ESOPHAGOGASTRODUODENOSCOPY (EGD) WITH PROPOFOL;  Surgeon: Laurence Spates, MD;  [facility];  Service: Endoscopy;  Laterality: N/A;   EYE SURGERY     bil lens implants   HEMOSTASIS CLIP PLACEMENT  08/31/2019   Procedure: HEMOSTASIS CLIP PLACEMENT;  Surgeon: Otis Brace, MD;  [facility];  Service: Gastroenterology;;  Ascending Polyp    HEMOSTASIS CONTROL  03/29/2020   Procedure: HEMOSTASIS CONTROL;  Surgeon:  Otis Brace, MD;  [facility];  Service: Gastroenterology;;   JOINT REPLACEMENT     s/p left knee   LASIK Bilateral    LEFT HEART CATH AND CORONARY ANGIOGRAPHY N/A 02/27/2018   Procedure: LEFT HEART CATH AND CORONARY ANGIOGRAPHY;  Surgeon: Belva Crome, MD;  Location: [place] [facility];  Service: Cardiovascular;  Laterality: N/A;   LUMBAR LAMINECTOMY/DECOMPRESSION MICRODISCECTOMY N/A 12/23/2014   Procedure:  L2-3 DECOMPRESSION;  Surgeon: Melina Schools, MD;  Location: [place];  Service: Orthopedics;  Laterality: N/A;   POLYPECTOMY  04/09/2019   Procedure: POLYPECTOMY;  Surgeon: Laurence Spates, MD;  [facility];  Service: Endoscopy;;   POLYPECTOMY  08/31/2019   Procedure: POLYPECTOMY;  Surgeon: Otis Brace, MD;  [facility];  Service: Gastroenterology;;   POLYPECTOMY  03/29/2020   Procedure: POLYPECTOMY;  Surgeon: Otis Brace, MD;  [facility];  Service: Gastroenterology;;   PROSTATECTOMY  2009   Robotic prostatectomy    Skin Lumps Removed     TOTAL KNEE ARTHROPLASTY  12/31/2011   Procedure: TOTAL KNEE ARTHROPLASTY;  Surgeon: Lorn Junes, MD;  Location: [place];  Service: Orthopedics;  Laterality: Left;  Left total knee arthroplasty   TOTAL SHOULDER REPLACEMENT  1990's   bilaterally   TRIGGER FINGER RELEASE Left 02/14/2021   Procedure: RELEASE TRIGGER FINGER/A-1 PULLEY LEFT RING FINGER;  Surgeon: Daryll Brod, MD;  Location: [place];  Service: Orthopedics;  Laterality: Left;  45 MIN    Current Medications: Current Meds  Medication Sig   amLODipine (NORVASC) 10 MG tablet TAKE 1 TABLET DAILY   Aromatic Inhalants (VICKS VAPOINHALER IN) Inhale 1 puff into the lungs at bedtime.   Continuous Blood Gluc Receiver (FREESTYLE LIBRE 14 DAY READER) DEVI SMARTSIG:1 Patch(s) Topical As Directed   Continuous Blood Gluc Sensor (FREESTYLE LIBRE 14 DAY SENSOR) MISC Apply topically.   docusate sodium (COLACE) 250 MG capsule Take 250  mg by mouth at bedtime.   empagliflozin (JARDIANCE) 10 MG TABS tablet Take 2 tablets by mouth daily.   furosemide (LASIX) 40 MG tablet Take 40 mg by mouth 2 (two) times daily.    insulin glargine (LANTUS) 100 UNIT/ML Solostar Pen Inject 38 Units into the skin daily.   isosorbide mononitrate (IMDUR) 30 MG 24 hr tablet Take 30 mg by mouth daily.   nitroGLYCERIN (NITROSTAT) 0.4 MG SL tablet Place 0.4 mg under the tongue every 5 (five) minutes as needed for chest pain.    ONE TOUCH ULTRA TEST test strip    rosuvastatin (CRESTOR) 10 MG tablet Take 10 mg by mouth daily.   telmisartan (MICARDIS) 80 MG tablet Take 1 tablet (80 mg total) by mouth daily.   traMADol (ULTRAM) 50 MG tablet Take 50 mg by mouth 2 (two) times daily as needed for moderate pain (arthritis back pain.).    vitamin B-12 (CYANOCOBALAMIN) 1000 MCG tablet Take 1,000 mcg by mouth at bedtime.  Vitamin D3 (VITAMIN D) 25 MCG tablet Take 1,000 Units by mouth daily.     Allergies:   Amoxicillin, Oxycodone hcl, and Simvastatin   Social History   Socioeconomic History   Marital status: Widowed    Spouse name: Not on file   Number of children: 1   Years of education: Not on file   Highest education level: Not on file  Occupational History   Occupation: Retired  Tobacco Use   Smoking status: Former    Packs/day: 0.50    Years: 15.00    Total pack years: 7.50    Types: Cigarettes    Quit date: 07/10/1971    Years since quitting: 50.8   Smokeless tobacco: Never  Vaping Use   Vaping Use: Never used  Substance and Sexual Activity   Alcohol use: Yes    Comment: 01/01/12 "occassionall have a glass of beer or wine; not even once a week"   Drug use: No   Sexual activity: Not Currently  Other Topics Concern   Not on file  Social History Narrative   Not on file   Social Determinants of Health   Financial Resource Strain: Low Risk  (02/27/2018)   Overall Financial Resource Strain (CARDIA)    Difficulty of Paying Living Expenses:  Not hard at all  Food Insecurity: No Food Insecurity (02/27/2018)   Hunger Vital Sign    Worried About Running Out of Food in the Last Year: Never true    [place] in the Last Year: Never true  Transportation Needs: No Transportation Needs (02/27/2018)   PRAPARE - Hydrologist (Medical): No    Lack of Transportation (Non-Medical): No  Physical Activity: Inactive (02/27/2018)   Exercise Vital Sign    Days of Exercise per Week: 0 days    Minutes of Exercise per Session: 0 min  Stress: No Stress Concern Present (02/27/2018)   [place]    Feeling of Stress : Not at all  Social Connections: Not on file     Family History: The patient's family history includes CAD in his father; COPD in his brother; CVA in his father and mother; Dementia in his mother; Heart disease in his father; Nephritis in his brother; Prostate cancer in his father; Stroke in his father; Tremor in his mother. There is no history of Heart attack.    ROS:   Please see the history of present illness.    All other systems reviewed and are negative.   EKGs/Labs/Other Studies Reviewed:    The following studies were reviewed today:  Monitor 04/2020 Basic rhythm is NSR and SB Intermittent sinus with BBB pattern, possible intermittent BBB vs AIVR Longest run of BBB vs AIVR rhythm 8 beats. No symptoms reported. No sustained arrhythmia   Sinus and sinus brady without high grade AV block or HR < 40 BPM. Occasional WCT salvos up to eight beats. No symptoms Benign study.  Echocardiogram 2021 IMPRESSIONS   1. Left ventricular ejection fraction, by estimation, is 65 to 70%. The  left ventricle has normal function. There is moderate concentric left  ventricular hypertrophy. Left ventricular diastolic parameters are  consistent with Grade I diastolic  dysfunction (impaired relaxation). Narrow LV outflow tract with peak   gradient 22 mmHg. No systolic anterior motion of the mitral valve.   2. Right ventricular systolic function is normal. The right ventricular  size is normal. There is normal pulmonary artery systolic  pressure. The  estimated right ventricular systolic pressure is 40.9 mmHg.   3. Left atrial size was mildly dilated.   4. The aortic valve is tricuspid. Aortic valve regurgitation is not  visualized. Mild aortic valve sclerosis is present, with no evidence of  aortic valve stenosis.   5. The mitral valve is normal in structure. Mild mitral valve  regurgitation. No evidence of mitral stenosis.   6. The inferior vena cava is normal in size with greater than 50%  respiratory variability, suggesting right atrial pressure of 3 mmHg.   Cath 02/2018 LEFT HEART CATH AND CORONARY ANGIOGRAPHY   Conclusion Dist LAD lesion is 50% stenosed. Right dominant coronary anatomy with luminal irregularities in the proximal to mid segment.  Irregularities in the PDA and left ventricular branches. Widely patent left main with distal 20% eccentric narrowing LAD wraps around the left ventricular apex.  Proximal to mid vessel contains lucent 30 to 50% narrowing.  The first and second diagonal branches contain 75% ostial narrowing.  First septal perforator contains 90% ostial narrowing. Circumflex is moderate in size and gives origin to a proximal to mid widely patent stent before 3 obtuse marginal branches arise.  The first obtuse marginal contains ostial 80% narrowing just distal to the stent margin.  The ostium of the circumflex contains 40 to 50% narrowing. Overall normal LV function.  LVEDP upper normal at 17 mmHg.  EF estimated to be 55%.   RECOMMENDATIONS:   Symptoms of dyspnea are out of proportion to the degree of coronary disease.  The patient has 2 diagonals, a skeletal perforator, a small first obtuse marginal all of which have significant ostial disease but due to relatively small size of best treated with  medical therapy.  Doubt these vessels are the source of the patient's complaints. Continue secondary risk factor modification. Consider pulmonary consultation/other etiologies for dyspnea.   Recommend Aspirin '81mg'$  daily for moderate CAD.  Diagnostic Diagram             EKG:  EKG is not  ordered today.   Recent Labs: 07/13/2021: BUN 54; Creatinine, Ser 2.44; Hemoglobin 12.6; Platelets 96; Potassium 4.2; Sodium 134  Recent Lipid Panel    Component Value Date/Time   CHOL 142 02/27/2018 0702   TRIG 75 02/27/2018 0702   HDL 44 02/27/2018 0702   CHOLHDL 3.2 02/27/2018 0702   VLDL 15 02/27/2018 0702   LDLCALC 83 02/27/2018 0702    Physical Exam:    VS:  BP (!) 108/58   Pulse (!) 51   Ht 5' 8.5" (1.74 m)   Wt 216 lb (98 kg)   SpO2 96%   BMI 32.36 kg/m     Wt Readings from Last 3 Encounters:  05/03/22 216 lb (98 kg)  04/26/21 214 lb 12.8 oz (97.4 kg)  02/14/21 211 lb 13.8 oz (96.1 kg)     GEN:  Well nourished, well developed in no acute distress HEENT: Normal NECK: No JVD; No carotid bruits LYMPHATICS: No lymphadenopathy CARDIAC: RRR, no murmurs, rubs, gallops RESPIRATORY:  Clear to auscultation without rales, wheezing or rhonchi  ABDOMEN: Soft, non-tender, non-distended MUSCULOSKELETAL: + edema; No deformity  SKIN: Warm and dry NEUROLOGIC:  Alert and oriented x 3 PSYCHIATRIC:  Normal affect   ASSESSMENT AND PLAN:    CAD No angina.  He will check with his PCP if okay to start aspirin.  He was never told the reason of stopping.  Continue Imdur and Crestor.  2. Chronic diastolic CHF -Stable lower  extremity edema.  Continue Lasix at current dose.  Recommended to followed by nephrologist given worsening renal function.  No orthopnea or PND.  Continue Jardiance.,  His edema could be due to amlodipine.  3. HTN -Blood pressure stable and controlled on current medications  4. HLD Continue statin LDL 88  Medication Adjustments/Labs and Tests Ordered: Current  medicines are reviewed at length with the patient today.  Concerns regarding medicines are outlined above.  No orders of the defined types were placed in this encounter.  No orders of the defined types were placed in this encounter.   Patient Instructions  Medication Instructions:  Your physician recommends that you continue on your current medications as directed. Please refer to the Current Medication list given to you today. *If you need a refill on your cardiac medications before your next appointment, please call your pharmacy*   Lab Work: None Ordered   Testing/Procedures: None Ordered   Follow-Up: At [facility], you and your health needs are our priority.  As part of our continuing mission to provide you with exceptional heart care, we have created designated Provider Care Teams.  These Care Teams include your primary Cardiologist (physician) and Advanced Practice Providers (APPs -  Physician Assistants and Nurse Practitioners) who all work together to provide you with the care you need, when you need it.  We recommend signing up for the patient portal called "MyChart".  Sign up information is provided on this After Visit Summary.  MyChart is used to connect with patients for Virtual Visits (Telemedicine).  Patients are able to view lab/test results, encounter notes, upcoming appointments, etc.  Non-urgent messages can be sent to your provider as well.   To learn more about what you can do with MyChart, go to [email or web address].    Your next appointment:   12 month(s)  The format for your next appointment:   In Person  Provider:   Sinclair Grooms, MD     Other Instructions   Important Information About Sugar         Jarrett Soho, [place]  05/03/2022 11:32 AM    [place]

## 2022-05-03 NOTE — Patient Instructions (Signed)
Medication Instructions:  Your physician recommends that you continue on your current medications as directed. Please refer to the Current Medication list given to you today. *If you need a refill on your cardiac medications before your next appointment, please call your pharmacy*   Lab Work: None Ordered   Testing/Procedures: None Ordered   Follow-Up: At [facility], you and your health needs are our priority.  As part of our continuing mission to provide you with exceptional heart care, we have created designated Provider Care Teams.  These Care Teams include your primary Cardiologist (physician) and Advanced Practice Providers (APPs -  Physician Assistants and Nurse Practitioners) who all work together to provide you with the care you need, when you need it.  We recommend signing up for the patient portal called "MyChart".  Sign up information is provided on this After Visit Summary.  MyChart is used to connect with patients for Virtual Visits (Telemedicine).  Patients are able to view lab/test results, encounter notes, upcoming appointments, etc.  Non-urgent messages can be sent to your provider as well.   To learn more about what you can do with MyChart, go to [email or web address].    Your next appointment:   12 month(s)  The format for your next appointment:   In Person  Provider:   Sinclair Grooms, MD     Other Instructions   Important Information About Sugar

## 2022-05-04 DIAGNOSIS — N2581 Secondary hyperparathyroidism of renal origin: Secondary | ICD-10-CM | POA: Diagnosis not present

## 2022-05-04 DIAGNOSIS — Z794 Long term (current) use of insulin: Secondary | ICD-10-CM | POA: Diagnosis not present

## 2022-05-04 DIAGNOSIS — E1122 Type 2 diabetes mellitus with diabetic chronic kidney disease: Secondary | ICD-10-CM | POA: Diagnosis not present

## 2022-05-04 DIAGNOSIS — Z7984 Long term (current) use of oral hypoglycemic drugs: Secondary | ICD-10-CM | POA: Diagnosis not present

## 2022-05-04 DIAGNOSIS — N1832 Chronic kidney disease, stage 3b: Secondary | ICD-10-CM | POA: Diagnosis not present

## 2022-05-15 IMAGING — DX DG LUMBAR SPINE 2-3V
3 series · 3 of 3 positions shown · non-contrast
Comparison: 12/23/2014.

CLINICAL DATA: Acute midline low back pain, no sciatica

EXAM:
LUMBAR SPINE - 2-3 VIEW

[dg lumbar spine 2-3 views (1 of 3)]
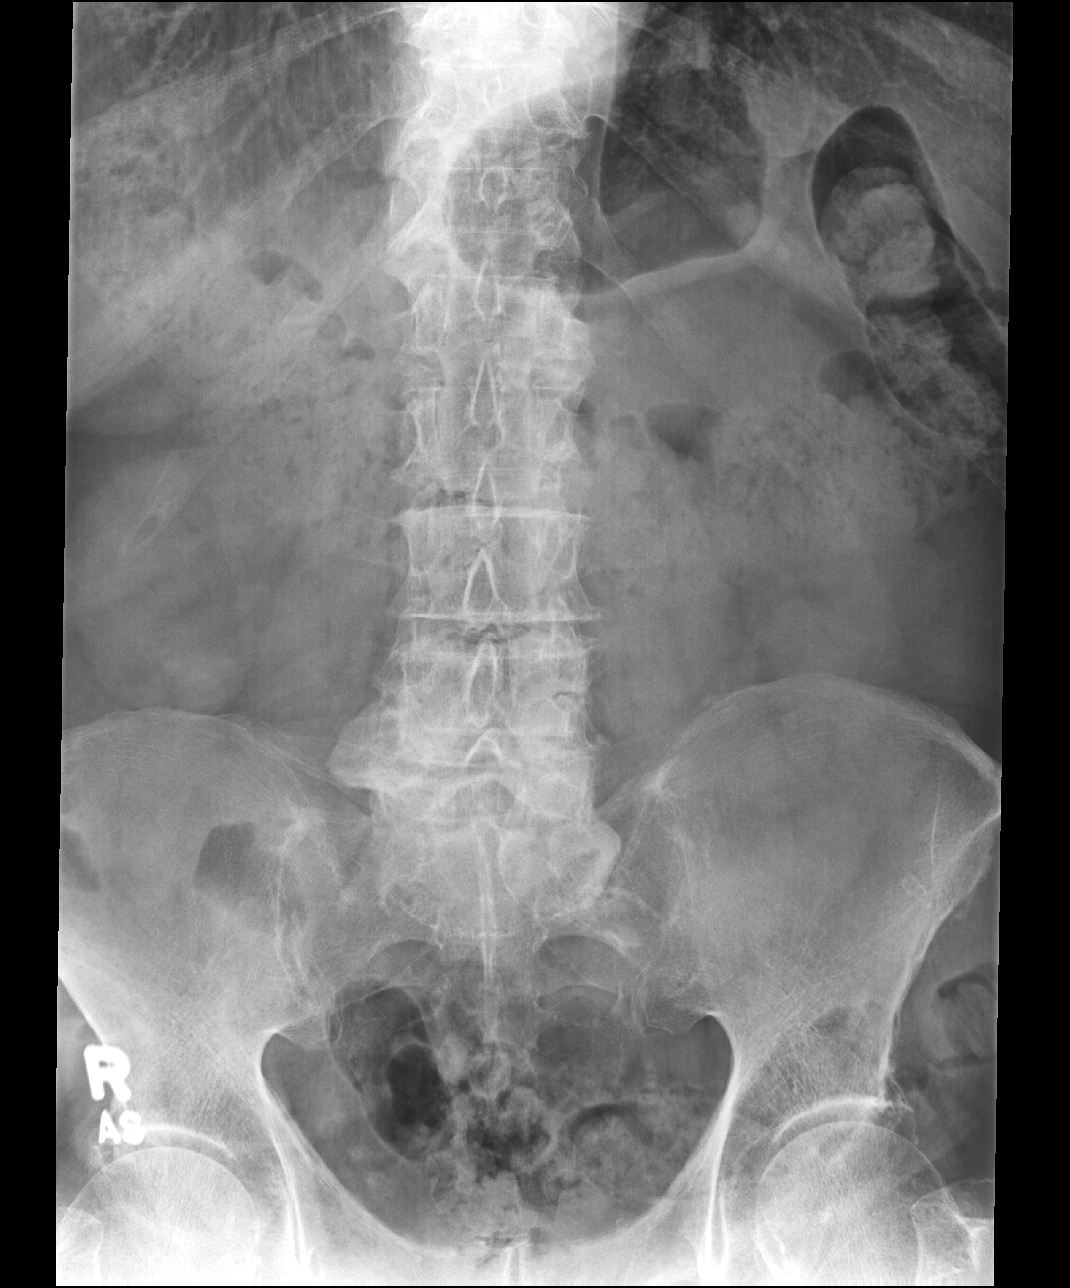

[dg lumbar spine 2-3 views (2 of 3)]
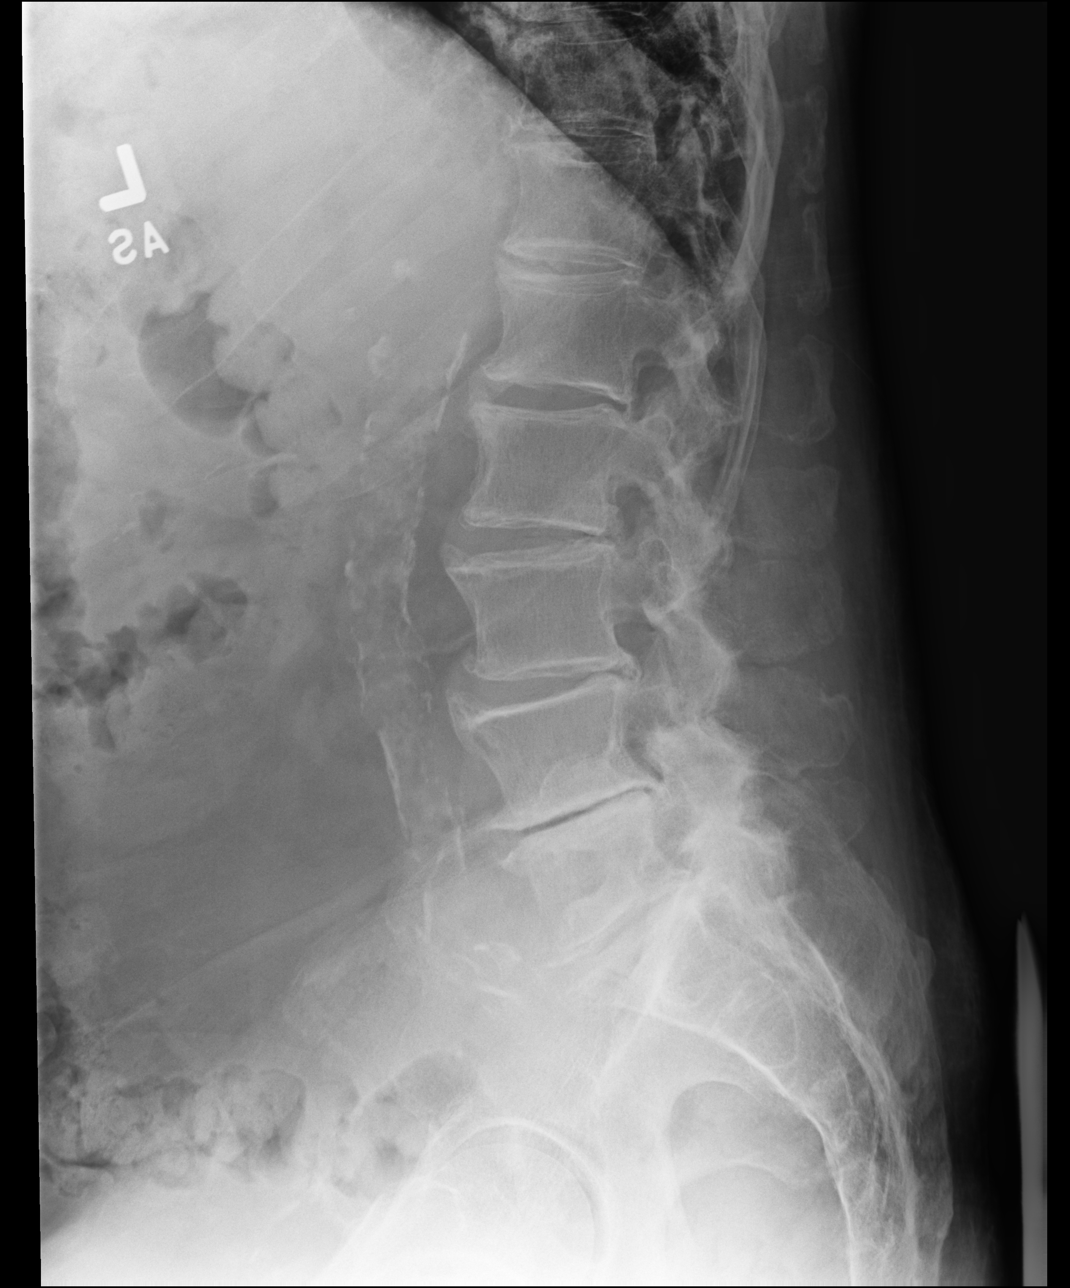

[dg lumbar spine 2-3 views (3 of 3)]
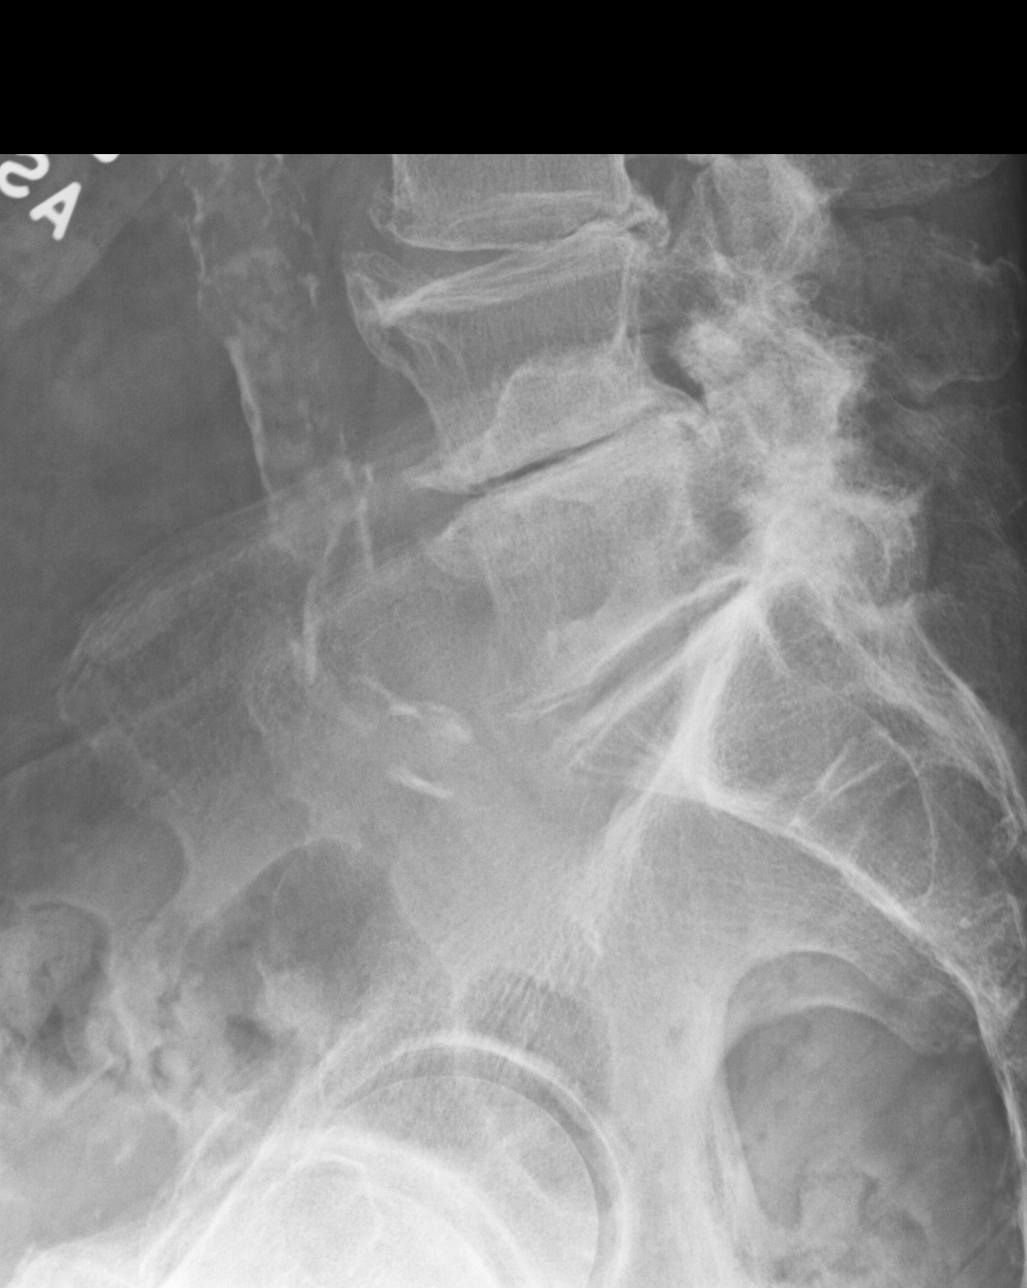

[3 of 3 positions shown; findings below may reference images not displayed]

FINDINGS: No fracture or dislocation of the lumbar spine. There is multilevel
moderate to severe disc degenerative disease and osteophytosis of
the lumbar spine, worst at L4-L5 and L5-S1. There is multilevel
severe facet degenerative change, worst at the lower lumbar levels.
Degenerative findings are slightly worsened compared to prior
radiographs dated 1979. Aortic atherosclerosis.
IMPRESSION: No fracture or dislocation of the lumbar spine. There is multilevel
moderate to severe disc degenerative disease and osteophytosis of
the lumbar spine, worst at L4-L5 and L5-S1. There is multilevel
severe facet degenerative change, worst at the lower lumbar levels.
Degenerative findings are slightly worsened compared to prior
radiographs dated 1979. Lumbar disc and neural foraminal pathology
may be further evaluated by MRI if indicated by neurologically
localizing signs and symptoms.

## 2022-05-15 IMAGING — DX DG HIP (WITH OR WITHOUT PELVIS) 2-3V*L*
2 series · 2 of 2 positions shown · non-contrast
Comparison: None.

CLINICAL DATA: Status post fall 10 days ago

EXAM:
DG HIP (WITH OR WITHOUT PELVIS) 2-3V LEFT

[dg hip unilat w or w/o pelvis 2-3 views  (1 of 2)]
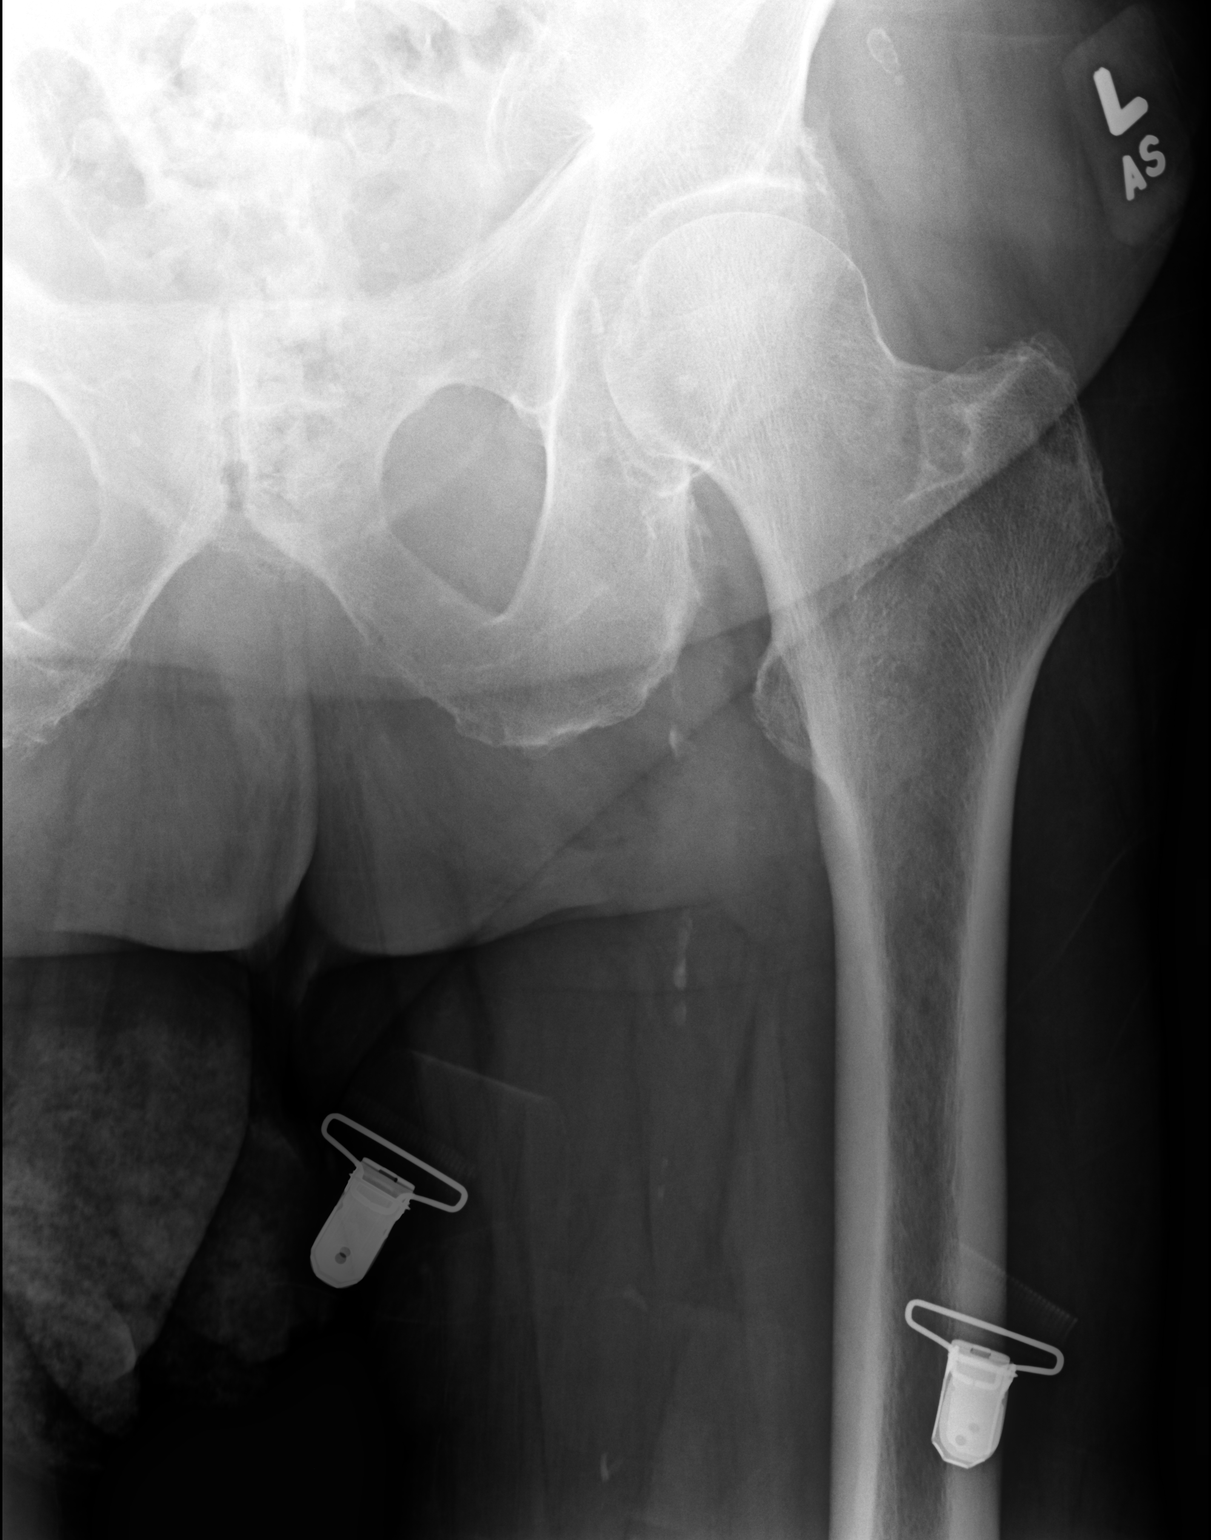

[dg hip unilat w or w/o pelvis 2-3 views  (2 of 2)]
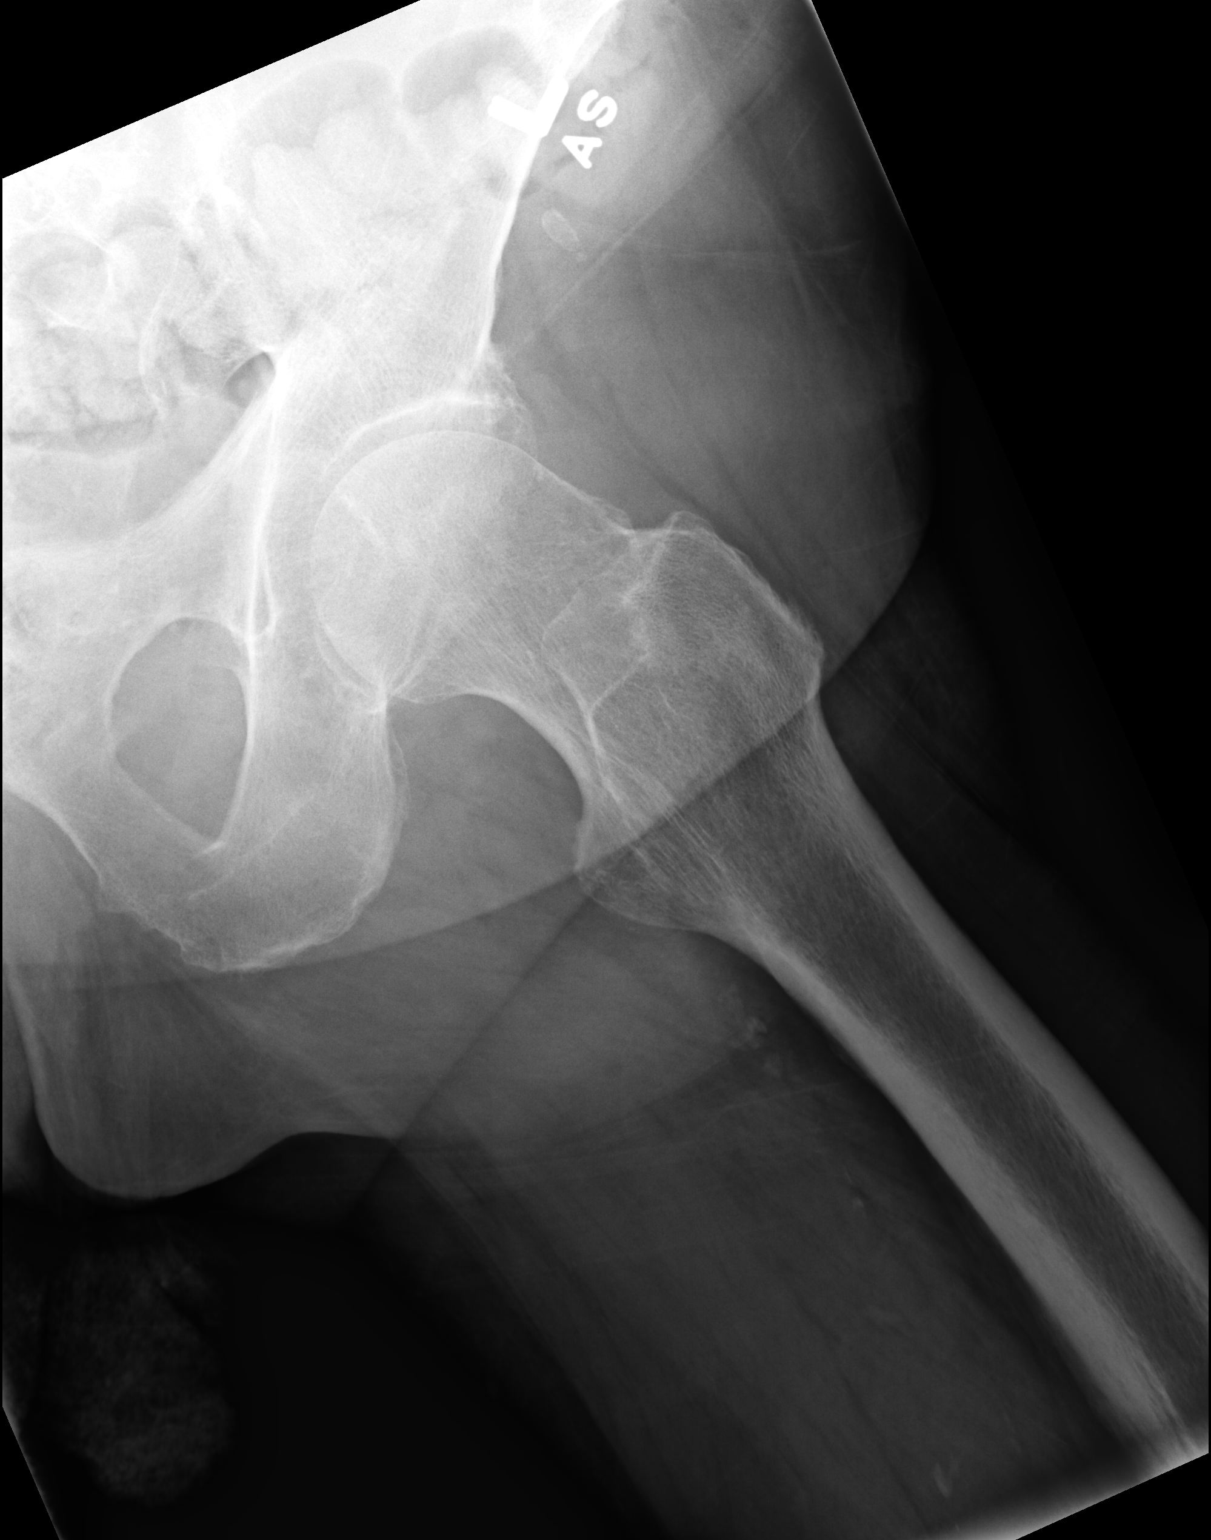

[2 of 2 positions shown; findings below may reference images not displayed]

FINDINGS: No acute fracture or dislocation. No aggressive osseous lesion.
Normal alignment. Generalized osteopenia. Mild osseous prominence
along the superior femoral head neck junction.

Soft tissue are unremarkable. No radiopaque foreign body or soft
tissue emphysema. Peripheral vascular atherosclerotic disease.
IMPRESSION: No acute osseous injury of the left hip. Given the patient's age and
osteopenia, if there is persistent clinical concern for an occult
hip fracture, a MRI of the hip is recommended for increased
sensitivity.

## 2022-05-23 DIAGNOSIS — E7800 Pure hypercholesterolemia, unspecified: Secondary | ICD-10-CM | POA: Diagnosis not present

## 2022-05-23 DIAGNOSIS — I10 Essential (primary) hypertension: Secondary | ICD-10-CM | POA: Diagnosis not present

## 2022-05-23 DIAGNOSIS — E1165 Type 2 diabetes mellitus with hyperglycemia: Secondary | ICD-10-CM | POA: Diagnosis not present

## 2022-06-13 ENCOUNTER — Other Ambulatory Visit: Payer: Self-pay | Admitting: Interventional Cardiology

## 2022-06-13 NOTE — Telephone Encounter (Signed)
Rx refill sent to pharmacy. 

## 2022-08-13 DIAGNOSIS — N184 Chronic kidney disease, stage 4 (severe): Secondary | ICD-10-CM | POA: Diagnosis not present

## 2022-08-15 DIAGNOSIS — N2581 Secondary hyperparathyroidism of renal origin: Secondary | ICD-10-CM | POA: Diagnosis not present

## 2022-08-15 DIAGNOSIS — E7800 Pure hypercholesterolemia, unspecified: Secondary | ICD-10-CM | POA: Diagnosis not present

## 2022-08-15 DIAGNOSIS — I700 Atherosclerosis of aorta: Secondary | ICD-10-CM | POA: Diagnosis not present

## 2022-08-15 DIAGNOSIS — I10 Essential (primary) hypertension: Secondary | ICD-10-CM | POA: Diagnosis not present

## 2022-08-15 DIAGNOSIS — I110 Hypertensive heart disease with heart failure: Secondary | ICD-10-CM | POA: Diagnosis not present

## 2022-08-15 DIAGNOSIS — N1832 Chronic kidney disease, stage 3b: Secondary | ICD-10-CM | POA: Diagnosis not present

## 2022-08-15 DIAGNOSIS — E1122 Type 2 diabetes mellitus with diabetic chronic kidney disease: Secondary | ICD-10-CM | POA: Diagnosis not present

## 2022-08-15 DIAGNOSIS — D696 Thrombocytopenia, unspecified: Secondary | ICD-10-CM | POA: Diagnosis not present

## 2022-08-15 DIAGNOSIS — Z794 Long term (current) use of insulin: Secondary | ICD-10-CM | POA: Diagnosis not present

## 2022-08-15 DIAGNOSIS — I447 Left bundle-branch block, unspecified: Secondary | ICD-10-CM | POA: Diagnosis not present

## 2022-08-15 DIAGNOSIS — I5030 Unspecified diastolic (congestive) heart failure: Secondary | ICD-10-CM | POA: Diagnosis not present

## 2022-08-15 DIAGNOSIS — Z8546 Personal history of malignant neoplasm of prostate: Secondary | ICD-10-CM | POA: Diagnosis not present

## 2022-08-20 DIAGNOSIS — I129 Hypertensive chronic kidney disease with stage 1 through stage 4 chronic kidney disease, or unspecified chronic kidney disease: Secondary | ICD-10-CM | POA: Diagnosis not present

## 2022-08-20 DIAGNOSIS — N2581 Secondary hyperparathyroidism of renal origin: Secondary | ICD-10-CM | POA: Diagnosis not present

## 2022-08-20 DIAGNOSIS — D631 Anemia in chronic kidney disease: Secondary | ICD-10-CM | POA: Diagnosis not present

## 2022-08-20 DIAGNOSIS — N184 Chronic kidney disease, stage 4 (severe): Secondary | ICD-10-CM | POA: Diagnosis not present

## 2022-08-31 DIAGNOSIS — E1165 Type 2 diabetes mellitus with hyperglycemia: Secondary | ICD-10-CM | POA: Diagnosis not present

## 2022-08-31 DIAGNOSIS — E785 Hyperlipidemia, unspecified: Secondary | ICD-10-CM | POA: Diagnosis not present

## 2022-08-31 DIAGNOSIS — I10 Essential (primary) hypertension: Secondary | ICD-10-CM | POA: Diagnosis not present

## 2023-02-13 DIAGNOSIS — I2510 Atherosclerotic heart disease of native coronary artery without angina pectoris: Secondary | ICD-10-CM | POA: Diagnosis not present

## 2023-02-13 DIAGNOSIS — I447 Left bundle-branch block, unspecified: Secondary | ICD-10-CM | POA: Diagnosis not present

## 2023-02-13 DIAGNOSIS — N2581 Secondary hyperparathyroidism of renal origin: Secondary | ICD-10-CM | POA: Diagnosis not present

## 2023-02-13 DIAGNOSIS — I10 Essential (primary) hypertension: Secondary | ICD-10-CM | POA: Diagnosis not present

## 2023-02-13 DIAGNOSIS — D696 Thrombocytopenia, unspecified: Secondary | ICD-10-CM | POA: Diagnosis not present

## 2023-02-13 DIAGNOSIS — I5030 Unspecified diastolic (congestive) heart failure: Secondary | ICD-10-CM | POA: Diagnosis not present

## 2023-02-13 DIAGNOSIS — I700 Atherosclerosis of aorta: Secondary | ICD-10-CM | POA: Diagnosis not present

## 2023-02-13 DIAGNOSIS — Z794 Long term (current) use of insulin: Secondary | ICD-10-CM | POA: Diagnosis not present

## 2023-02-13 DIAGNOSIS — N184 Chronic kidney disease, stage 4 (severe): Secondary | ICD-10-CM | POA: Diagnosis not present

## 2023-02-13 DIAGNOSIS — E1122 Type 2 diabetes mellitus with diabetic chronic kidney disease: Secondary | ICD-10-CM | POA: Diagnosis not present

## 2023-02-13 DIAGNOSIS — E7800 Pure hypercholesterolemia, unspecified: Secondary | ICD-10-CM | POA: Diagnosis not present

## 2023-02-21 DIAGNOSIS — I509 Heart failure, unspecified: Secondary | ICD-10-CM | POA: Diagnosis not present

## 2023-02-21 DIAGNOSIS — Z6835 Body mass index (BMI) 35.0-35.9, adult: Secondary | ICD-10-CM | POA: Diagnosis not present

## 2023-02-21 DIAGNOSIS — Z9079 Acquired absence of other genital organ(s): Secondary | ICD-10-CM | POA: Diagnosis not present

## 2023-02-21 DIAGNOSIS — Z7984 Long term (current) use of oral hypoglycemic drugs: Secondary | ICD-10-CM | POA: Diagnosis not present

## 2023-02-21 DIAGNOSIS — Z8546 Personal history of malignant neoplasm of prostate: Secondary | ICD-10-CM | POA: Diagnosis not present

## 2023-02-21 DIAGNOSIS — Z794 Long term (current) use of insulin: Secondary | ICD-10-CM | POA: Diagnosis not present

## 2023-02-21 DIAGNOSIS — E1122 Type 2 diabetes mellitus with diabetic chronic kidney disease: Secondary | ICD-10-CM | POA: Diagnosis not present

## 2023-02-21 DIAGNOSIS — N184 Chronic kidney disease, stage 4 (severe): Secondary | ICD-10-CM | POA: Diagnosis not present

## 2023-03-01 DIAGNOSIS — D631 Anemia in chronic kidney disease: Secondary | ICD-10-CM | POA: Diagnosis not present

## 2023-03-01 DIAGNOSIS — N2581 Secondary hyperparathyroidism of renal origin: Secondary | ICD-10-CM | POA: Diagnosis not present

## 2023-03-01 DIAGNOSIS — I509 Heart failure, unspecified: Secondary | ICD-10-CM | POA: Diagnosis not present

## 2023-03-01 DIAGNOSIS — N184 Chronic kidney disease, stage 4 (severe): Secondary | ICD-10-CM | POA: Diagnosis not present

## 2023-03-01 DIAGNOSIS — I129 Hypertensive chronic kidney disease with stage 1 through stage 4 chronic kidney disease, or unspecified chronic kidney disease: Secondary | ICD-10-CM | POA: Diagnosis not present

## 2023-03-01 LAB — LAB REPORT - SCANNED: EGFR: 22

## 2023-05-20 DIAGNOSIS — Z1331 Encounter for screening for depression: Secondary | ICD-10-CM | POA: Diagnosis not present

## 2023-05-20 DIAGNOSIS — S41112A Laceration without foreign body of left upper arm, initial encounter: Secondary | ICD-10-CM | POA: Diagnosis not present

## 2023-05-20 DIAGNOSIS — W010XXA Fall on same level from slipping, tripping and stumbling without subsequent striking against object, initial encounter: Secondary | ICD-10-CM | POA: Diagnosis not present

## 2023-05-20 DIAGNOSIS — Y92481 Parking lot as the place of occurrence of the external cause: Secondary | ICD-10-CM | POA: Diagnosis not present

## 2023-05-30 DIAGNOSIS — S50811A Abrasion of right forearm, initial encounter: Secondary | ICD-10-CM | POA: Diagnosis not present

## 2023-06-19 DIAGNOSIS — N184 Chronic kidney disease, stage 4 (severe): Secondary | ICD-10-CM | POA: Diagnosis not present

## 2023-07-08 DIAGNOSIS — N2581 Secondary hyperparathyroidism of renal origin: Secondary | ICD-10-CM | POA: Diagnosis not present

## 2023-07-08 DIAGNOSIS — E7800 Pure hypercholesterolemia, unspecified: Secondary | ICD-10-CM | POA: Diagnosis not present

## 2023-07-08 DIAGNOSIS — N184 Chronic kidney disease, stage 4 (severe): Secondary | ICD-10-CM | POA: Diagnosis not present

## 2023-07-08 DIAGNOSIS — I700 Atherosclerosis of aorta: Secondary | ICD-10-CM | POA: Diagnosis not present

## 2023-07-08 DIAGNOSIS — E1122 Type 2 diabetes mellitus with diabetic chronic kidney disease: Secondary | ICD-10-CM | POA: Diagnosis not present

## 2023-07-08 DIAGNOSIS — Z1331 Encounter for screening for depression: Secondary | ICD-10-CM | POA: Diagnosis not present

## 2023-07-08 DIAGNOSIS — I2510 Atherosclerotic heart disease of native coronary artery without angina pectoris: Secondary | ICD-10-CM | POA: Diagnosis not present

## 2023-07-08 DIAGNOSIS — D696 Thrombocytopenia, unspecified: Secondary | ICD-10-CM | POA: Diagnosis not present

## 2023-07-08 DIAGNOSIS — I25119 Atherosclerotic heart disease of native coronary artery with unspecified angina pectoris: Secondary | ICD-10-CM | POA: Diagnosis not present

## 2023-07-08 DIAGNOSIS — Z0000 Encounter for general adult medical examination without abnormal findings: Secondary | ICD-10-CM | POA: Diagnosis not present

## 2023-07-08 DIAGNOSIS — I5030 Unspecified diastolic (congestive) heart failure: Secondary | ICD-10-CM | POA: Diagnosis not present

## 2023-07-08 DIAGNOSIS — I447 Left bundle-branch block, unspecified: Secondary | ICD-10-CM | POA: Diagnosis not present

## 2023-07-08 DIAGNOSIS — I10 Essential (primary) hypertension: Secondary | ICD-10-CM | POA: Diagnosis not present

## 2023-07-15 NOTE — Progress Notes (Signed)
 Cardiology Office Note:   Date:  07/17/2023  ID:  Ryan Hunt, DOB June 22, 1931, MRN [id number] PCP:  Rexanne Ingle, MD  [facility] Providers Cardiologist:  Wendel Haws, MD Referring MD: Rexanne Ingle, MD  Chief Complaint/Reason for Referral: Cardiology follow-up ASSESSMENT:    1. Coronary artery disease involving native coronary artery of native heart without angina pectoris   2. Type 2 diabetes mellitus with complication, with long-term current use of insulin  (HCC)   3. CKD (chronic kidney disease) stage 4, GFR 15-29 ml/min (HCC)   4. Hypertension associated with diabetes (HCC)   5. Hyperlipidemia associated with type 2 diabetes mellitus (HCC)   6. Aortic atherosclerosis (HCC)   7. Chronic diastolic CHF (congestive heart failure) (HCC)   8. BMI 32.0-32.9,adult   9. Murmur     PLAN:   In order of problems listed above: Coronary artery disease: Continue aspirin  81 mg daily and continue Crestor  10 mg. Type 2 diabetes mellitus: Continue aspirin  81 mg, continue Jardiance, continue Crestor , continue telmisartan  80 mg. CKD stage IV: Continue Jardiance and telmisartan  for renal protection. Hypertension:  BP is well controlled Hyperlipidemia: Check lipid panel, LFTs today.  Will defer LP(a) testing in this advanced age individual. Aortic atherosclerosis: Start aspirin  and continue Crestor  10 mg. Diastolic heart failure: Continue telmisartan  and Jardiance. Elevated BMI: Will defer GLP-1 receptor agonist therapy in this advanced age individual. Murmur:  Will obtain TTE to evaluate aortic valve.            Dispo:  Return in about 1 year (around 07/16/2024).      Medication Adjustments/Labs and Tests Ordered: Current medicines are reviewed at length with the patient today.  Concerns regarding medicines are outlined above.  The following changes have been made:  no change   Labs/tests ordered: Orders Placed This Encounter  Procedures   Lipid Profile   Hepatic function  panel   EKG 12-Lead    Medication Changes: Meds ordered this encounter  Medications   aspirin  EC 81 MG tablet    Sig: Take 1 tablet (81 mg total) by mouth daily. Swallow whole.    Current medicines are reviewed at length with the patient today.  The patient does not have concerns regarding medicines.  I spent 33 minutes reviewing all clinical data during and prior to this visit including all relevant imaging studies, laboratories, clinical information from other health systems and prior notes from both Cardiology and other specialties, interviewing the patient, conducting a complete physical examination, and coordinating care in order to formulate a comprehensive and personalized evaluation and treatment plan.   History of Present Illness:      FOCUSED PROBLEM LIST:   Coronary artery disease PCI left circumflex 2010 secondary to angina Cath 2019 mild ISR, mild LAD disease mild diagonal disease, LVEDP 17 mmHg T2DM On insulin  Hypertension Hyperlipidemia Intolerant of simvastatin CKD stage IV Aortic atherosclerosis Lumbar spine film 2021 BMI 32 Diastolic heart failure LVH, EF 65 to 70%, aortic sclerosis TTE 2021 IVCD  1/25: The patient returns for routine follow-up.  He was last seen in 2023.  He was doing well without chest pain.  No changes were made to his medical regimen.  The patient continues to do well.  Unfortunately his wife died about 5 years ago.  He adopted a cat from the kennel named [employer].  It has been keeping him company and he enjoys its company.    He is compliant with his medical therapy and denies any chest pain or  shortness of breath.  He does suffer from relatively frequent falls.  He has not fallen and hit his head however.  He seems to get his feet caught up but not lifting his feet up off the ground.  He is using a cane which has helped.  He fortunately has not required any emergency room visits or hospitalizations because of this issue.  He denies any  presyncope or syncope.  He is otherwise well and without significant complaints.          Current Medications: Current Meds  Medication Sig   amLODipine  (NORVASC ) 10 MG tablet TAKE 1 TABLET DAILY   Aromatic Inhalants (VICKS VAPOINHALER IN) Inhale 1 puff into the lungs at bedtime.   aspirin  EC 81 MG tablet Take 1 tablet (81 mg total) by mouth daily. Swallow whole.   Continuous Blood Gluc Receiver (FREESTYLE LIBRE 14 DAY READER) DEVI SMARTSIG:1 Patch(s) Topical As Directed   Continuous Blood Gluc Sensor (FREESTYLE LIBRE 14 DAY SENSOR) MISC Apply topically.   docusate sodium  (COLACE) 250 MG capsule Take 250 mg by mouth at bedtime.   empagliflozin (JARDIANCE) 10 MG TABS tablet Take 2 tablets by mouth daily.   furosemide  (LASIX ) 40 MG tablet Take 40 mg by mouth 2 (two) times daily.    insulin  glargine (LANTUS ) 100 UNIT/ML Solostar Pen Inject 38 Units into the skin daily.   isosorbide  mononitrate (IMDUR ) 30 MG 24 hr tablet Take 30 mg by mouth daily.   nitroGLYCERIN  (NITROSTAT ) 0.4 MG SL tablet Place 0.4 mg under the tongue every 5 (five) minutes as needed for chest pain.    ONE TOUCH ULTRA TEST test strip    rosuvastatin  (CRESTOR ) 10 MG tablet Take 10 mg by mouth daily.   telmisartan  (MICARDIS ) 80 MG tablet Take 1 tablet (80 mg total) by mouth daily.   traMADol  (ULTRAM ) 50 MG tablet Take 50 mg by mouth 2 (two) times daily as needed for moderate pain (arthritis back pain.).    vitamin B-12 (CYANOCOBALAMIN) 1000 MCG tablet Take 1,000 mcg by mouth at bedtime.   Vitamin D3 (VITAMIN D) 25 MCG tablet Take 1,000 Units by mouth daily.     Review of Systems:   Please see the history of present illness.    All other systems reviewed and are negative.     EKGs/Labs/Other Test Reviewed:   EKG: EKG from January 2023 that I reviewed demonstrates normal sinus rhythm and IVCD  EKG Interpretation Date/Time:  Wednesday July 17 2023 10:43:33 EST Ventricular Rate:  70 PR Interval:  266 QRS  Duration:  148 QT Interval:  420 QTC Calculation: 453 R Axis:   10  Text Interpretation: Sinus rhythm with 1st degree A-V block Left bundle branch block When compared with ECG of 13-Jul-2021 19:25, Sinus rhythm has replaced Wide QRS rhythm Confirmed by Wendel Haws ([id number]) on 07/17/2023 11:08:05 AM         Risk Assessment/Calculations:          Physical Exam:   VS:  BP 125/80   Pulse 67   Ht 5' 8 (1.727 m)   Wt 221 lb 9.6 oz (100.5 kg)   SpO2 92%   BMI 33.69 kg/m        Wt Readings from Last 3 Encounters:  07/17/23 221 lb 9.6 oz (100.5 kg)  05/03/22 216 lb (98 kg)  04/26/21 214 lb 12.8 oz (97.4 kg)      GENERAL:  No apparent distress, AOx3 HEENT:  No carotid bruits, +2 carotid impulses, no  scleral icterus CAR: RRR no murmurs, gallops, rubs, or thrills RES:  Clear to auscultation bilaterally ABD:  Soft, nontender, nondistended, positive bowel sounds x 4 VASC:  +2 radial pulses, +2 carotid pulses NEURO:  CN 2-12 grossly intact; motor and sensory grossly intact PSYCH:  No active depression or anxiety EXT:  No edema, ecchymosis, or cyanosis  Signed, Savannah Erbe K Allani Reber, MD  07/17/2023 11:48 AM    [facility] [address] [place], [place], [place]  [address] Phone: [phone]; Fax: [phone]   Note:  This document was prepared using Dragon voice recognition software and may include unintentional dictation errors.

## 2023-07-17 ENCOUNTER — Ambulatory Visit: Payer: Medicare Other | Attending: Internal Medicine | Admitting: Internal Medicine

## 2023-07-17 ENCOUNTER — Encounter: Payer: Self-pay | Admitting: Internal Medicine

## 2023-07-17 VITALS — BP 125/80 | HR 67 | Ht 68.0 in | Wt 221.6 lb

## 2023-07-17 DIAGNOSIS — I2510 Atherosclerotic heart disease of native coronary artery without angina pectoris: Secondary | ICD-10-CM | POA: Diagnosis not present

## 2023-07-17 DIAGNOSIS — E1169 Type 2 diabetes mellitus with other specified complication: Secondary | ICD-10-CM | POA: Diagnosis not present

## 2023-07-17 DIAGNOSIS — N184 Chronic kidney disease, stage 4 (severe): Secondary | ICD-10-CM

## 2023-07-17 DIAGNOSIS — E1159 Type 2 diabetes mellitus with other circulatory complications: Secondary | ICD-10-CM

## 2023-07-17 DIAGNOSIS — I5032 Chronic diastolic (congestive) heart failure: Secondary | ICD-10-CM

## 2023-07-17 DIAGNOSIS — I700 Atherosclerosis of aorta: Secondary | ICD-10-CM | POA: Diagnosis not present

## 2023-07-17 DIAGNOSIS — E118 Type 2 diabetes mellitus with unspecified complications: Secondary | ICD-10-CM | POA: Diagnosis not present

## 2023-07-17 DIAGNOSIS — E785 Hyperlipidemia, unspecified: Secondary | ICD-10-CM

## 2023-07-17 DIAGNOSIS — I152 Hypertension secondary to endocrine disorders: Secondary | ICD-10-CM

## 2023-07-17 DIAGNOSIS — Z794 Long term (current) use of insulin: Secondary | ICD-10-CM | POA: Diagnosis not present

## 2023-07-17 DIAGNOSIS — R011 Cardiac murmur, unspecified: Secondary | ICD-10-CM | POA: Diagnosis not present

## 2023-07-17 DIAGNOSIS — Z6832 Body mass index (BMI) 32.0-32.9, adult: Secondary | ICD-10-CM

## 2023-07-17 MED ORDER — ASPIRIN 81 MG PO TBEC: 81.0000 mg | DELAYED_RELEASE_TABLET | Freq: Every day | ORAL | Status: AC

## 2023-07-17 NOTE — Patient Instructions (Signed)
 Medication Instructions:   START TAKING ASPIRIN  81 MG BY MOUTH DAILY  *If you need a refill on your cardiac medications before your next appointment, please call your pharmacy*   Lab Work:  TODAY--DOWNSTAIRS FIRST FLOOR AT [facility] AND LFTs  If you have labs (blood work) drawn today and your tests are completely normal, you will receive your results only by: MyChart Message (if you have MyChart) OR A paper copy in the mail If you have any lab test that is abnormal or we need to change your treatment, we will call you to review the results.     Follow-Up: At [facility], you and your health needs are our priority.  As part of our continuing mission to provide you with exceptional heart care, we have created designated Provider Care Teams.  These Care Teams include your primary Cardiologist (physician) and Advanced Practice Providers (APPs -  Physician Assistants and Nurse Practitioners) who all work together to provide you with the care you need, when you need it.  We recommend signing up for the patient portal called MyChart.  Sign up information is provided on this After Visit Summary.  MyChart is used to connect with patients for Virtual Visits (Telemedicine).  Patients are able to view lab/test results, encounter notes, upcoming appointments, etc.  Non-urgent messages can be sent to your provider as well.   To learn more about what you can do with MyChart, go to [email or web address].    Your next appointment:   1 year(s)  Provider:   DR. THUKKANI

## 2023-07-18 LAB — LIPID PANEL
Chol/HDL Ratio: 3.9 {ratio} (ref 0.0–5.0)
Cholesterol, Total: 161 mg/dL (ref 100–199)
HDL: 41 mg/dL (ref >39)
LDL Chol Calc (NIH): 96 mg/dL (ref 0–99)
Triglycerides: 135 mg/dL (ref 0–149)
VLDL Cholesterol Cal: 24 mg/dL (ref 5–40)

## 2023-07-18 LAB — HEPATIC FUNCTION PANEL
ALT: 20 [IU]/L (ref 0–44)
AST: 22 [IU]/L (ref 0–40)
Albumin: 4.3 g/dL (ref 3.6–4.6)
Alkaline Phosphatase: 63 [IU]/L (ref 44–121)
Bilirubin Total: 0.5 mg/dL (ref 0.0–1.2)
Bilirubin, Direct: 0.2 mg/dL (ref 0.00–0.40)
Total Protein: 7 g/dL (ref 6.0–8.5)

## 2023-07-24 DIAGNOSIS — H905 Unspecified sensorineural hearing loss: Secondary | ICD-10-CM | POA: Diagnosis not present

## 2023-12-06 DIAGNOSIS — E1165 Type 2 diabetes mellitus with hyperglycemia: Secondary | ICD-10-CM | POA: Diagnosis not present

## 2023-12-06 DIAGNOSIS — N1832 Chronic kidney disease, stage 3b: Secondary | ICD-10-CM | POA: Diagnosis not present

## 2023-12-06 DIAGNOSIS — E1122 Type 2 diabetes mellitus with diabetic chronic kidney disease: Secondary | ICD-10-CM | POA: Diagnosis not present

## 2023-12-06 DIAGNOSIS — I25119 Atherosclerotic heart disease of native coronary artery with unspecified angina pectoris: Secondary | ICD-10-CM | POA: Diagnosis not present

## 2023-12-24 ENCOUNTER — Other Ambulatory Visit: Payer: Self-pay | Admitting: Internal Medicine

## 2023-12-24 ENCOUNTER — Encounter: Payer: Self-pay | Admitting: Internal Medicine

## 2023-12-24 DIAGNOSIS — N184 Chronic kidney disease, stage 4 (severe): Secondary | ICD-10-CM | POA: Diagnosis not present

## 2023-12-24 DIAGNOSIS — E7800 Pure hypercholesterolemia, unspecified: Secondary | ICD-10-CM | POA: Diagnosis not present

## 2023-12-24 DIAGNOSIS — Z794 Long term (current) use of insulin: Secondary | ICD-10-CM | POA: Diagnosis not present

## 2023-12-24 DIAGNOSIS — I447 Left bundle-branch block, unspecified: Secondary | ICD-10-CM | POA: Diagnosis not present

## 2023-12-24 DIAGNOSIS — I10 Essential (primary) hypertension: Secondary | ICD-10-CM | POA: Diagnosis not present

## 2023-12-24 DIAGNOSIS — N2581 Secondary hyperparathyroidism of renal origin: Secondary | ICD-10-CM | POA: Diagnosis not present

## 2023-12-24 DIAGNOSIS — E1122 Type 2 diabetes mellitus with diabetic chronic kidney disease: Secondary | ICD-10-CM | POA: Diagnosis not present

## 2023-12-24 DIAGNOSIS — I2510 Atherosclerotic heart disease of native coronary artery without angina pectoris: Secondary | ICD-10-CM | POA: Diagnosis not present

## 2023-12-24 DIAGNOSIS — I5030 Unspecified diastolic (congestive) heart failure: Secondary | ICD-10-CM | POA: Diagnosis not present

## 2023-12-24 DIAGNOSIS — R0989 Other specified symptoms and signs involving the circulatory and respiratory systems: Secondary | ICD-10-CM

## 2023-12-24 DIAGNOSIS — D696 Thrombocytopenia, unspecified: Secondary | ICD-10-CM | POA: Diagnosis not present

## 2023-12-24 DIAGNOSIS — I700 Atherosclerosis of aorta: Secondary | ICD-10-CM | POA: Diagnosis not present

## 2023-12-27 ENCOUNTER — Ambulatory Visit
Admission: RE | Admit: 2023-12-27 | Discharge: 2023-12-27 | Disposition: A | Discharge status: Home / Self Care | Source: Ambulatory Visit | Attending: Internal Medicine | Admitting: Internal Medicine

## 2023-12-27 DIAGNOSIS — I739 Peripheral vascular disease, unspecified: Secondary | ICD-10-CM | POA: Diagnosis not present

## 2023-12-27 DIAGNOSIS — R0989 Other specified symptoms and signs involving the circulatory and respiratory systems: Secondary | ICD-10-CM

## 2024-01-04 DIAGNOSIS — I25119 Atherosclerotic heart disease of native coronary artery with unspecified angina pectoris: Secondary | ICD-10-CM | POA: Diagnosis not present

## 2024-01-04 DIAGNOSIS — E1122 Type 2 diabetes mellitus with diabetic chronic kidney disease: Secondary | ICD-10-CM | POA: Diagnosis not present

## 2024-01-04 DIAGNOSIS — N1832 Chronic kidney disease, stage 3b: Secondary | ICD-10-CM | POA: Diagnosis not present

## 2024-01-04 DIAGNOSIS — E1165 Type 2 diabetes mellitus with hyperglycemia: Secondary | ICD-10-CM | POA: Diagnosis not present

## 2024-01-06 DIAGNOSIS — N1832 Chronic kidney disease, stage 3b: Secondary | ICD-10-CM | POA: Diagnosis not present

## 2024-01-06 DIAGNOSIS — I5030 Unspecified diastolic (congestive) heart failure: Secondary | ICD-10-CM | POA: Diagnosis not present

## 2024-01-06 DIAGNOSIS — E1165 Type 2 diabetes mellitus with hyperglycemia: Secondary | ICD-10-CM | POA: Diagnosis not present

## 2024-01-06 DIAGNOSIS — E1122 Type 2 diabetes mellitus with diabetic chronic kidney disease: Secondary | ICD-10-CM | POA: Diagnosis not present

## 2024-01-06 DIAGNOSIS — N184 Chronic kidney disease, stage 4 (severe): Secondary | ICD-10-CM | POA: Diagnosis not present

## 2024-01-06 DIAGNOSIS — I25119 Atherosclerotic heart disease of native coronary artery with unspecified angina pectoris: Secondary | ICD-10-CM | POA: Diagnosis not present

## 2024-02-03 DIAGNOSIS — E1165 Type 2 diabetes mellitus with hyperglycemia: Secondary | ICD-10-CM | POA: Diagnosis not present

## 2024-02-03 DIAGNOSIS — N1832 Chronic kidney disease, stage 3b: Secondary | ICD-10-CM | POA: Diagnosis not present

## 2024-02-03 DIAGNOSIS — I25119 Atherosclerotic heart disease of native coronary artery with unspecified angina pectoris: Secondary | ICD-10-CM | POA: Diagnosis not present

## 2024-02-03 DIAGNOSIS — E1122 Type 2 diabetes mellitus with diabetic chronic kidney disease: Secondary | ICD-10-CM | POA: Diagnosis not present

## 2024-02-06 DIAGNOSIS — E1165 Type 2 diabetes mellitus with hyperglycemia: Secondary | ICD-10-CM | POA: Diagnosis not present

## 2024-02-06 DIAGNOSIS — N1832 Chronic kidney disease, stage 3b: Secondary | ICD-10-CM | POA: Diagnosis not present

## 2024-02-06 DIAGNOSIS — E1122 Type 2 diabetes mellitus with diabetic chronic kidney disease: Secondary | ICD-10-CM | POA: Diagnosis not present

## 2024-02-06 DIAGNOSIS — N184 Chronic kidney disease, stage 4 (severe): Secondary | ICD-10-CM | POA: Diagnosis not present

## 2024-02-06 DIAGNOSIS — I5030 Unspecified diastolic (congestive) heart failure: Secondary | ICD-10-CM | POA: Diagnosis not present

## 2024-02-06 DIAGNOSIS — I25119 Atherosclerotic heart disease of native coronary artery with unspecified angina pectoris: Secondary | ICD-10-CM | POA: Diagnosis not present

## 2024-02-19 DIAGNOSIS — S61459A Open bite of unspecified hand, initial encounter: Secondary | ICD-10-CM | POA: Diagnosis not present

## 2024-02-19 DIAGNOSIS — S61401A Unspecified open wound of right hand, initial encounter: Secondary | ICD-10-CM | POA: Diagnosis not present

## 2024-03-04 DIAGNOSIS — N1832 Chronic kidney disease, stage 3b: Secondary | ICD-10-CM | POA: Diagnosis not present

## 2024-03-04 DIAGNOSIS — I25119 Atherosclerotic heart disease of native coronary artery with unspecified angina pectoris: Secondary | ICD-10-CM | POA: Diagnosis not present

## 2024-03-04 DIAGNOSIS — E1122 Type 2 diabetes mellitus with diabetic chronic kidney disease: Secondary | ICD-10-CM | POA: Diagnosis not present

## 2024-03-04 DIAGNOSIS — E1165 Type 2 diabetes mellitus with hyperglycemia: Secondary | ICD-10-CM | POA: Diagnosis not present

## 2024-03-08 DIAGNOSIS — N1832 Chronic kidney disease, stage 3b: Secondary | ICD-10-CM | POA: Diagnosis not present

## 2024-03-08 DIAGNOSIS — E1165 Type 2 diabetes mellitus with hyperglycemia: Secondary | ICD-10-CM | POA: Diagnosis not present

## 2024-03-08 DIAGNOSIS — E1122 Type 2 diabetes mellitus with diabetic chronic kidney disease: Secondary | ICD-10-CM | POA: Diagnosis not present

## 2024-03-08 DIAGNOSIS — I5030 Unspecified diastolic (congestive) heart failure: Secondary | ICD-10-CM | POA: Diagnosis not present

## 2024-03-08 DIAGNOSIS — I25119 Atherosclerotic heart disease of native coronary artery with unspecified angina pectoris: Secondary | ICD-10-CM | POA: Diagnosis not present

## 2024-03-08 DIAGNOSIS — N184 Chronic kidney disease, stage 4 (severe): Secondary | ICD-10-CM | POA: Diagnosis not present

## 2024-04-03 DIAGNOSIS — E1122 Type 2 diabetes mellitus with diabetic chronic kidney disease: Secondary | ICD-10-CM | POA: Diagnosis not present

## 2024-04-03 DIAGNOSIS — N1832 Chronic kidney disease, stage 3b: Secondary | ICD-10-CM | POA: Diagnosis not present

## 2024-04-03 DIAGNOSIS — I25119 Atherosclerotic heart disease of native coronary artery with unspecified angina pectoris: Secondary | ICD-10-CM | POA: Diagnosis not present

## 2024-04-03 DIAGNOSIS — E1165 Type 2 diabetes mellitus with hyperglycemia: Secondary | ICD-10-CM | POA: Diagnosis not present

## 2024-04-07 DIAGNOSIS — I25119 Atherosclerotic heart disease of native coronary artery with unspecified angina pectoris: Secondary | ICD-10-CM | POA: Diagnosis not present

## 2024-04-07 DIAGNOSIS — E1122 Type 2 diabetes mellitus with diabetic chronic kidney disease: Secondary | ICD-10-CM | POA: Diagnosis not present

## 2024-04-07 DIAGNOSIS — E1165 Type 2 diabetes mellitus with hyperglycemia: Secondary | ICD-10-CM | POA: Diagnosis not present

## 2024-04-07 DIAGNOSIS — I5030 Unspecified diastolic (congestive) heart failure: Secondary | ICD-10-CM | POA: Diagnosis not present

## 2024-04-07 DIAGNOSIS — N184 Chronic kidney disease, stage 4 (severe): Secondary | ICD-10-CM | POA: Diagnosis not present

## 2024-04-07 DIAGNOSIS — N1832 Chronic kidney disease, stage 3b: Secondary | ICD-10-CM | POA: Diagnosis not present

## 2024-05-08 ENCOUNTER — Other Ambulatory Visit (HOSPITAL_COMMUNITY): Payer: Self-pay | Admitting: Internal Medicine

## 2024-05-08 DIAGNOSIS — I358 Other nonrheumatic aortic valve disorders: Secondary | ICD-10-CM

## 2024-05-19 ENCOUNTER — Ambulatory Visit (HOSPITAL_COMMUNITY)
Admission: RE | Admit: 2024-05-19 | Discharge: 2024-05-19 | Disposition: A | Discharge status: Home / Self Care | Source: Ambulatory Visit | Attending: Internal Medicine | Admitting: Internal Medicine

## 2024-05-19 DIAGNOSIS — I110 Hypertensive heart disease with heart failure: Secondary | ICD-10-CM | POA: Diagnosis not present

## 2024-05-19 DIAGNOSIS — I081 Rheumatic disorders of both mitral and tricuspid valves: Secondary | ICD-10-CM | POA: Diagnosis not present

## 2024-05-19 DIAGNOSIS — I2510 Atherosclerotic heart disease of native coronary artery without angina pectoris: Secondary | ICD-10-CM | POA: Insufficient documentation

## 2024-05-19 DIAGNOSIS — I358 Other nonrheumatic aortic valve disorders: Secondary | ICD-10-CM | POA: Insufficient documentation

## 2024-05-19 DIAGNOSIS — I509 Heart failure, unspecified: Secondary | ICD-10-CM | POA: Insufficient documentation

## 2024-05-19 DIAGNOSIS — I350 Nonrheumatic aortic (valve) stenosis: Secondary | ICD-10-CM

## 2024-05-19 LAB — ECHOCARDIOGRAM COMPLETE
AR max vel: 2.76 cm2
AV Area VTI: 2.81 cm2
AV Area mean vel: 3.21 cm2
AV Mean grad: 7 mmHg
AV Peak grad: 13.7 mmHg
Ao pk vel: 1.85 m/s
Area-P 1/2: 2.64 cm2
S' Lateral: 2.9 cm

## 2024-05-25 ENCOUNTER — Inpatient Hospital Stay (HOSPITAL_COMMUNITY)
Admission: EM | Admit: 2024-05-25 | Discharge: 2024-05-28 | DRG: 308 | Disposition: A | Discharge status: Home Health | Attending: Internal Medicine | Admitting: Internal Medicine

## 2024-05-25 ENCOUNTER — Other Ambulatory Visit: Payer: Self-pay

## 2024-05-25 ENCOUNTER — Encounter (HOSPITAL_COMMUNITY): Payer: Self-pay

## 2024-05-25 DIAGNOSIS — Z8711 Personal history of peptic ulcer disease: Secondary | ICD-10-CM

## 2024-05-25 DIAGNOSIS — R5381 Other malaise: Secondary | ICD-10-CM | POA: Diagnosis present

## 2024-05-25 DIAGNOSIS — Z885 Allergy status to narcotic agent status: Secondary | ICD-10-CM

## 2024-05-25 DIAGNOSIS — I2510 Atherosclerotic heart disease of native coronary artery without angina pectoris: Secondary | ICD-10-CM | POA: Diagnosis present

## 2024-05-25 DIAGNOSIS — Z8546 Personal history of malignant neoplasm of prostate: Secondary | ICD-10-CM

## 2024-05-25 DIAGNOSIS — Z8042 Family history of malignant neoplasm of prostate: Secondary | ICD-10-CM

## 2024-05-25 DIAGNOSIS — D61818 Other pancytopenia: Secondary | ICD-10-CM | POA: Diagnosis present

## 2024-05-25 DIAGNOSIS — Z87891 Personal history of nicotine dependence: Secondary | ICD-10-CM

## 2024-05-25 DIAGNOSIS — Z79899 Other long term (current) drug therapy: Secondary | ICD-10-CM

## 2024-05-25 DIAGNOSIS — E875 Hyperkalemia: Secondary | ICD-10-CM | POA: Diagnosis not present

## 2024-05-25 DIAGNOSIS — E1122 Type 2 diabetes mellitus with diabetic chronic kidney disease: Secondary | ICD-10-CM | POA: Diagnosis present

## 2024-05-25 DIAGNOSIS — Z794 Long term (current) use of insulin: Secondary | ICD-10-CM

## 2024-05-25 DIAGNOSIS — Z961 Presence of intraocular lens: Secondary | ICD-10-CM | POA: Diagnosis present

## 2024-05-25 DIAGNOSIS — I130 Hypertensive heart and chronic kidney disease with heart failure and stage 1 through stage 4 chronic kidney disease, or unspecified chronic kidney disease: Secondary | ICD-10-CM | POA: Diagnosis present

## 2024-05-25 DIAGNOSIS — Z6832 Body mass index (BMI) 32.0-32.9, adult: Secondary | ICD-10-CM

## 2024-05-25 DIAGNOSIS — Z825 Family history of asthma and other chronic lower respiratory diseases: Secondary | ICD-10-CM

## 2024-05-25 DIAGNOSIS — Z955 Presence of coronary angioplasty implant and graft: Secondary | ICD-10-CM

## 2024-05-25 DIAGNOSIS — E7849 Other hyperlipidemia: Secondary | ICD-10-CM | POA: Diagnosis present

## 2024-05-25 DIAGNOSIS — I440 Atrioventricular block, first degree: Secondary | ICD-10-CM | POA: Diagnosis present

## 2024-05-25 DIAGNOSIS — Z9841 Cataract extraction status, right eye: Secondary | ICD-10-CM

## 2024-05-25 DIAGNOSIS — Z96652 Presence of left artificial knee joint: Secondary | ICD-10-CM | POA: Diagnosis present

## 2024-05-25 DIAGNOSIS — Z9842 Cataract extraction status, left eye: Secondary | ICD-10-CM

## 2024-05-25 DIAGNOSIS — Z7984 Long term (current) use of oral hypoglycemic drugs: Secondary | ICD-10-CM

## 2024-05-25 DIAGNOSIS — I700 Atherosclerosis of aorta: Secondary | ICD-10-CM | POA: Diagnosis present

## 2024-05-25 DIAGNOSIS — R001 Bradycardia, unspecified: Secondary | ICD-10-CM | POA: Diagnosis not present

## 2024-05-25 DIAGNOSIS — E1169 Type 2 diabetes mellitus with other specified complication: Secondary | ICD-10-CM | POA: Diagnosis present

## 2024-05-25 DIAGNOSIS — Z87442 Personal history of urinary calculi: Secondary | ICD-10-CM

## 2024-05-25 DIAGNOSIS — Z9079 Acquired absence of other genital organ(s): Secondary | ICD-10-CM

## 2024-05-25 DIAGNOSIS — I152 Hypertension secondary to endocrine disorders: Secondary | ICD-10-CM | POA: Diagnosis present

## 2024-05-25 DIAGNOSIS — Z96611 Presence of right artificial shoulder joint: Secondary | ICD-10-CM | POA: Diagnosis present

## 2024-05-25 DIAGNOSIS — I447 Left bundle-branch block, unspecified: Secondary | ICD-10-CM | POA: Diagnosis present

## 2024-05-25 DIAGNOSIS — Z888 Allergy status to other drugs, medicaments and biological substances status: Secondary | ICD-10-CM

## 2024-05-25 DIAGNOSIS — I10 Essential (primary) hypertension: Secondary | ICD-10-CM | POA: Diagnosis present

## 2024-05-25 DIAGNOSIS — Z823 Family history of stroke: Secondary | ICD-10-CM

## 2024-05-25 DIAGNOSIS — Z7982 Long term (current) use of aspirin: Secondary | ICD-10-CM

## 2024-05-25 DIAGNOSIS — Z880 Allergy status to penicillin: Secondary | ICD-10-CM

## 2024-05-25 DIAGNOSIS — K219 Gastro-esophageal reflux disease without esophagitis: Secondary | ICD-10-CM | POA: Diagnosis present

## 2024-05-25 DIAGNOSIS — I5033 Acute on chronic diastolic (congestive) heart failure: Secondary | ICD-10-CM | POA: Diagnosis present

## 2024-05-25 DIAGNOSIS — I5030 Unspecified diastolic (congestive) heart failure: Secondary | ICD-10-CM | POA: Diagnosis present

## 2024-05-25 DIAGNOSIS — E669 Obesity, unspecified: Secondary | ICD-10-CM | POA: Diagnosis present

## 2024-05-25 DIAGNOSIS — E1159 Type 2 diabetes mellitus with other circulatory complications: Secondary | ICD-10-CM | POA: Diagnosis present

## 2024-05-25 DIAGNOSIS — E7800 Pure hypercholesterolemia, unspecified: Secondary | ICD-10-CM | POA: Diagnosis present

## 2024-05-25 DIAGNOSIS — Z8249 Family history of ischemic heart disease and other diseases of the circulatory system: Secondary | ICD-10-CM

## 2024-05-25 DIAGNOSIS — N184 Chronic kidney disease, stage 4 (severe): Secondary | ICD-10-CM | POA: Diagnosis present

## 2024-05-25 DIAGNOSIS — Z96612 Presence of left artificial shoulder joint: Secondary | ICD-10-CM | POA: Diagnosis present

## 2024-05-25 LAB — COMPREHENSIVE METABOLIC PANEL WITH GFR
ALT: 19 U/L (ref 0–44)
AST: 25 U/L (ref 15–41)
Albumin: 3.6 g/dL (ref 3.5–5.0)
Alkaline Phosphatase: 52 U/L (ref 38–126)
Anion gap: 9 (ref 5–15)
BUN: 56 mg/dL — ABNORMAL HIGH (ref 8–23)
CO2: 23 mmol/L (ref 22–32)
Calcium: 9.3 mg/dL (ref 8.9–10.3)
Chloride: 105 mmol/L (ref 98–111)
Creatinine, Ser: 2.7 mg/dL — ABNORMAL HIGH (ref 0.61–1.24)
GFR, Estimated: 22 mL/min — ABNORMAL LOW (ref >60)
Glucose, Bld: 244 mg/dL — ABNORMAL HIGH (ref 70–99)
Potassium: 5 mmol/L (ref 3.5–5.1)
Sodium: 137 mmol/L (ref 135–145)
Total Bilirubin: 0.9 mg/dL (ref 0.0–1.2)
Total Protein: 6.8 g/dL (ref 6.5–8.1)

## 2024-05-25 LAB — CBC WITH DIFFERENTIAL/PLATELET
Abs Immature Granulocytes: 0.01 K/uL (ref 0.00–0.07)
Basophils Absolute: 0 K/uL (ref 0.0–0.1)
Basophils Relative: 1 %
Eosinophils Absolute: 0.2 K/uL (ref 0.0–0.5)
Eosinophils Relative: 4 %
HCT: 33 % — ABNORMAL LOW (ref 39.0–52.0)
Hemoglobin: 10.8 g/dL — ABNORMAL LOW (ref 13.0–17.0)
Immature Granulocytes: 0 %
Lymphocytes Relative: 31 %
Lymphs Abs: 1.3 K/uL (ref 0.7–4.0)
MCH: 30.2 pg (ref 26.0–34.0)
MCHC: 32.7 g/dL (ref 30.0–36.0)
MCV: 92.2 fL (ref 80.0–100.0)
Monocytes Absolute: 0.5 K/uL (ref 0.1–1.0)
Monocytes Relative: 12 %
Neutro Abs: 2.1 K/uL (ref 1.7–7.7)
Neutrophils Relative %: 52 %
Platelets: 83 K/uL — ABNORMAL LOW (ref 150–400)
RBC: 3.58 MIL/uL — ABNORMAL LOW (ref 4.22–5.81)
RDW: 14.2 % (ref 11.5–15.5)
WBC: 4.1 K/uL (ref 4.0–10.5)
nRBC: 0 % (ref 0.0–0.2)

## 2024-05-25 NOTE — ED Triage Notes (Signed)
 Patient here with hypotension and bradycardia. Patient is wearing a halter monitor and he states the doctor told him his heart rate dropped too low 11 times today and he needed to come to the ED. Denies shob, chest pain. Does report he is dizzy.

## 2024-05-25 NOTE — ED Provider Triage Note (Signed)
 Emergency Medicine Provider Triage Evaluation Note  KIYAN BURMESTER , a 89 y.o. male  was evaluated in triage.  Pt complains of increased dizziness, referred by PCP secondary to repeated episodes of bradycardia while on a Holter monitor.  Denies any chest pain, denies shortness of breath, denies any other symptoms at this time.  Does endorse exertional dyspnea, does increase dizziness increasing with position, specifically standing after prolonged sitting as well as assuming a bent over posture..  Review of Systems  Positive: As above Negative:   Physical Exam  BP (!) 158/53 (BP Location: Right Arm)   Pulse (!) 51   Temp 97.7 F (36.5 C)   Resp (!) 22   Ht 5' 8 (1.727 m)   Wt 97.5 kg   SpO2 97%   BMI 32.69 kg/m  Gen:   Awake, no distress   Resp:  Normal effort  MSK:   Moves extremities without difficulty  Other:    Medical Decision Making  Medically screening exam initiated at 6:29 PM.  Appropriate orders placed.  ALPHUS ZECK was informed that the remainder of the evaluation will be completed by another provider, this initial triage assessment does not replace that evaluation, and the importance of remaining in the ED until their evaluation is complete.  Initial labs ordered.   Myriam Dorn [place], [place] 05/25/24 [phone]

## 2024-05-26 ENCOUNTER — Emergency Department (HOSPITAL_COMMUNITY)

## 2024-05-26 DIAGNOSIS — E1122 Type 2 diabetes mellitus with diabetic chronic kidney disease: Secondary | ICD-10-CM | POA: Diagnosis present

## 2024-05-26 DIAGNOSIS — R001 Bradycardia, unspecified: Secondary | ICD-10-CM | POA: Diagnosis present

## 2024-05-26 DIAGNOSIS — Z96611 Presence of right artificial shoulder joint: Secondary | ICD-10-CM | POA: Diagnosis present

## 2024-05-26 DIAGNOSIS — Z96612 Presence of left artificial shoulder joint: Secondary | ICD-10-CM | POA: Diagnosis present

## 2024-05-26 DIAGNOSIS — Z6832 Body mass index (BMI) 32.0-32.9, adult: Secondary | ICD-10-CM | POA: Diagnosis not present

## 2024-05-26 DIAGNOSIS — I5033 Acute on chronic diastolic (congestive) heart failure: Secondary | ICD-10-CM | POA: Diagnosis present

## 2024-05-26 DIAGNOSIS — E1159 Type 2 diabetes mellitus with other circulatory complications: Secondary | ICD-10-CM | POA: Diagnosis present

## 2024-05-26 DIAGNOSIS — Z87891 Personal history of nicotine dependence: Secondary | ICD-10-CM | POA: Diagnosis not present

## 2024-05-26 DIAGNOSIS — D61818 Other pancytopenia: Secondary | ICD-10-CM | POA: Diagnosis present

## 2024-05-26 DIAGNOSIS — I2510 Atherosclerotic heart disease of native coronary artery without angina pectoris: Secondary | ICD-10-CM

## 2024-05-26 DIAGNOSIS — E875 Hyperkalemia: Secondary | ICD-10-CM | POA: Diagnosis not present

## 2024-05-26 DIAGNOSIS — Z955 Presence of coronary angioplasty implant and graft: Secondary | ICD-10-CM | POA: Diagnosis not present

## 2024-05-26 DIAGNOSIS — N184 Chronic kidney disease, stage 4 (severe): Secondary | ICD-10-CM | POA: Diagnosis present

## 2024-05-26 DIAGNOSIS — I700 Atherosclerosis of aorta: Secondary | ICD-10-CM | POA: Diagnosis present

## 2024-05-26 DIAGNOSIS — I10 Essential (primary) hypertension: Secondary | ICD-10-CM

## 2024-05-26 DIAGNOSIS — Z8249 Family history of ischemic heart disease and other diseases of the circulatory system: Secondary | ICD-10-CM | POA: Diagnosis not present

## 2024-05-26 DIAGNOSIS — I447 Left bundle-branch block, unspecified: Secondary | ICD-10-CM | POA: Diagnosis present

## 2024-05-26 DIAGNOSIS — I130 Hypertensive heart and chronic kidney disease with heart failure and stage 1 through stage 4 chronic kidney disease, or unspecified chronic kidney disease: Secondary | ICD-10-CM | POA: Diagnosis present

## 2024-05-26 DIAGNOSIS — E669 Obesity, unspecified: Secondary | ICD-10-CM

## 2024-05-26 DIAGNOSIS — E7800 Pure hypercholesterolemia, unspecified: Secondary | ICD-10-CM

## 2024-05-26 DIAGNOSIS — R5381 Other malaise: Secondary | ICD-10-CM | POA: Diagnosis present

## 2024-05-26 DIAGNOSIS — Z7982 Long term (current) use of aspirin: Secondary | ICD-10-CM | POA: Diagnosis not present

## 2024-05-26 DIAGNOSIS — E1169 Type 2 diabetes mellitus with other specified complication: Secondary | ICD-10-CM | POA: Diagnosis present

## 2024-05-26 DIAGNOSIS — I152 Hypertension secondary to endocrine disorders: Secondary | ICD-10-CM | POA: Diagnosis present

## 2024-05-26 DIAGNOSIS — Z7984 Long term (current) use of oral hypoglycemic drugs: Secondary | ICD-10-CM | POA: Diagnosis not present

## 2024-05-26 DIAGNOSIS — Z794 Long term (current) use of insulin: Secondary | ICD-10-CM | POA: Diagnosis not present

## 2024-05-26 DIAGNOSIS — I5030 Unspecified diastolic (congestive) heart failure: Secondary | ICD-10-CM | POA: Diagnosis present

## 2024-05-26 DIAGNOSIS — E119 Type 2 diabetes mellitus without complications: Secondary | ICD-10-CM

## 2024-05-26 DIAGNOSIS — Z79899 Other long term (current) drug therapy: Secondary | ICD-10-CM | POA: Diagnosis not present

## 2024-05-26 LAB — CBC WITH DIFFERENTIAL/PLATELET
Abs Immature Granulocytes: 0.01 K/uL (ref 0.00–0.07)
Basophils Absolute: 0 K/uL (ref 0.0–0.1)
Basophils Relative: 0 %
Eosinophils Absolute: 0.2 K/uL (ref 0.0–0.5)
Eosinophils Relative: 5 %
HCT: 29.1 % — ABNORMAL LOW (ref 39.0–52.0)
Hemoglobin: 9.4 g/dL — ABNORMAL LOW (ref 13.0–17.0)
Immature Granulocytes: 0 %
Lymphocytes Relative: 29 %
Lymphs Abs: 1 K/uL (ref 0.7–4.0)
MCH: 29.7 pg (ref 26.0–34.0)
MCHC: 32.3 g/dL (ref 30.0–36.0)
MCV: 92.1 fL (ref 80.0–100.0)
Monocytes Absolute: 0.4 K/uL (ref 0.1–1.0)
Monocytes Relative: 13 %
Neutro Abs: 1.8 K/uL (ref 1.7–7.7)
Neutrophils Relative %: 53 %
Platelets: 78 K/uL — ABNORMAL LOW (ref 150–400)
RBC: 3.16 MIL/uL — ABNORMAL LOW (ref 4.22–5.81)
RDW: 14.2 % (ref 11.5–15.5)
Smear Review: NORMAL
WBC: 3.5 K/uL — ABNORMAL LOW (ref 4.0–10.5)
nRBC: 0 % (ref 0.0–0.2)

## 2024-05-26 LAB — COMPREHENSIVE METABOLIC PANEL WITH GFR
ALT: 18 U/L (ref 0–44)
AST: 22 U/L (ref 15–41)
Albumin: 3.2 g/dL — ABNORMAL LOW (ref 3.5–5.0)
Alkaline Phosphatase: 46 U/L (ref 38–126)
Anion gap: 8 (ref 5–15)
BUN: 54 mg/dL — ABNORMAL HIGH (ref 8–23)
CO2: 23 mmol/L (ref 22–32)
Calcium: 8.9 mg/dL (ref 8.9–10.3)
Chloride: 107 mmol/L (ref 98–111)
Creatinine, Ser: 2.57 mg/dL — ABNORMAL HIGH (ref 0.61–1.24)
GFR, Estimated: 23 mL/min — ABNORMAL LOW (ref >60)
Glucose, Bld: 111 mg/dL — ABNORMAL HIGH (ref 70–99)
Potassium: 4.6 mmol/L (ref 3.5–5.1)
Sodium: 138 mmol/L (ref 135–145)
Total Bilirubin: 0.8 mg/dL (ref 0.0–1.2)
Total Protein: 6.2 g/dL — ABNORMAL LOW (ref 6.5–8.1)

## 2024-05-26 LAB — GLUCOSE, CAPILLARY
Glucose-Capillary: 123 mg/dL — ABNORMAL HIGH (ref 70–99)
Glucose-Capillary: 111 mg/dL — ABNORMAL HIGH (ref 70–99)

## 2024-05-26 LAB — HEMOGLOBIN A1C
Hgb A1c MFr Bld: 7.8 % — ABNORMAL HIGH (ref 4.8–5.6)
Mean Plasma Glucose: 177.16 mg/dL

## 2024-05-26 LAB — IRON AND TIBC
Iron: 109 ug/dL (ref 45–182)
Saturation Ratios: 27 % (ref 17.9–39.5)
TIBC: 407 ug/dL (ref 250–450)
UIBC: 298 ug/dL

## 2024-05-26 LAB — TECHNOLOGIST SMEAR REVIEW: Plt Morphology: NORMAL

## 2024-05-26 LAB — TSH: TSH: 1.792 u[IU]/mL (ref 0.350–4.500)

## 2024-05-26 LAB — MAGNESIUM: Magnesium: 2.7 mg/dL — ABNORMAL HIGH (ref 1.7–2.4)

## 2024-05-26 LAB — FERRITIN: Ferritin: 17 ng/mL — ABNORMAL LOW (ref 24–336)

## 2024-05-26 LAB — BRAIN NATRIURETIC PEPTIDE: B Natriuretic Peptide: 133.8 pg/mL — ABNORMAL HIGH (ref 0.0–100.0)

## 2024-05-26 MED ORDER — INSULIN ASPART 100 UNIT/ML IJ SOLN: 0.0000 [IU] | Freq: Every day | INTRAMUSCULAR | Status: DC

## 2024-05-26 MED ORDER — AMLODIPINE BESYLATE 10 MG PO TABS: 10.0000 mg | ORAL_TABLET | Freq: Every day | ORAL | Status: DC

## 2024-05-26 MED ORDER — ONDANSETRON HCL 4 MG/2ML IJ SOLN: 4.0000 mg | Freq: Four times a day (QID) | INTRAMUSCULAR | Status: DC | PRN

## 2024-05-26 MED ORDER — INSULIN GLARGINE-YFGN 100 UNIT/ML ~~LOC~~ SOLN
20.0000 [IU] | SUBCUTANEOUS | Status: DC
  Administered 2024-05-26 – 2024-05-27 (×2): 20 [IU] via SUBCUTANEOUS
  Filled 2024-05-26 (×3): qty 0.2

## 2024-05-26 MED ORDER — ACETAMINOPHEN 325 MG PO TABS: 650.0000 mg | ORAL_TABLET | Freq: Four times a day (QID) | ORAL | Status: DC | PRN

## 2024-05-26 MED ORDER — FUROSEMIDE 10 MG/ML IJ SOLN
40.0000 mg | Freq: Once | INTRAMUSCULAR | Status: AC
  Administered 2024-05-26: 40 mg via INTRAVENOUS
  Filled 2024-05-26: qty 4

## 2024-05-26 MED ORDER — ASPIRIN 81 MG PO TBEC
81.0000 mg | DELAYED_RELEASE_TABLET | Freq: Every day | ORAL | Status: DC
  Administered 2024-05-26 – 2024-05-27 (×2): 81 mg via ORAL
  Filled 2024-05-26 (×2): qty 1

## 2024-05-26 MED ORDER — ACETAMINOPHEN 650 MG RE SUPP: 650.0000 mg | Freq: Four times a day (QID) | RECTAL | Status: DC | PRN

## 2024-05-26 MED ORDER — ROSUVASTATIN CALCIUM 5 MG PO TABS
10.0000 mg | ORAL_TABLET | Freq: Every day | ORAL | Status: DC
  Administered 2024-05-26 – 2024-05-28 (×3): 10 mg via ORAL
  Filled 2024-05-26 (×3): qty 2

## 2024-05-26 MED ORDER — TRAMADOL HCL 50 MG PO TABS: 50.0000 mg | ORAL_TABLET | Freq: Two times a day (BID) | ORAL | Status: DC | PRN

## 2024-05-26 MED ORDER — ISOSORBIDE MONONITRATE ER 30 MG PO TB24
30.0000 mg | ORAL_TABLET | Freq: Every day | ORAL | Status: DC
  Administered 2024-05-26 – 2024-05-28 (×3): 30 mg via ORAL
  Filled 2024-05-26 (×3): qty 1

## 2024-05-26 MED ORDER — INSULIN ASPART 100 UNIT/ML IJ SOLN
0.0000 [IU] | Freq: Three times a day (TID) | INTRAMUSCULAR | Status: DC
  Administered 2024-05-27: 2 [IU] via SUBCUTANEOUS
  Administered 2024-05-27: 3 [IU] via SUBCUTANEOUS
  Filled 2024-05-26: qty 2
  Filled 2024-05-26: qty 3

## 2024-05-26 MED ORDER — ALBUTEROL SULFATE (2.5 MG/3ML) 0.083% IN NEBU: 2.5000 mg | INHALATION_SOLUTION | Freq: Four times a day (QID) | RESPIRATORY_TRACT | Status: DC | PRN

## 2024-05-26 NOTE — ED Provider Notes (Signed)
 [place] [facility]MENT AT [facility] Provider Note   CSN: [id number] Arrival date & time: 05/25/24  8195     Patient presents with: Bradycardia, Hypotension, and Shortness of Breath   Ryan Hunt is a 89 y.o. male.   The history is provided by the patient.  Shortness of Breath Severity:  Moderate Onset quality:  Gradual Timing:  Constant Progression:  Unchanged Chronicity:  New Context: not URI and not weather changes   Relieved by:  Nothing Worsened by:  Nothing Ineffective treatments:  None tried Associated symptoms: no abdominal pain, no headaches, no sore throat and no swollen glands   Risk factors: no prolonged immobilization, no recent surgery and no tobacco use   Patient with CAD presents with SOB and lightheadedness secondary to reported low HR and low BP.  Is wearing a monitor that reportedly alarmed 11 times today and was sent in by PMD.      Past Medical History:  Diagnosis Date   Anemia    Anginal pain    Arthritis    knees   Bronchitis 11/2011   first time for me   CAD (coronary artery disease)    With DES circumflex 02/2009   CHF (congestive heart failure) (HCC)    Coronary atherosclerosis of native coronary artery    Diabetes mellitus with kidney disease (HCC)    Diastolic heart failure (HCC)    Diverticulosis of colon (without mention of hemorrhage)    Dyspnea    Exertional dyspnea    Chronic   GERD (gastroesophageal reflux disease)    History of kidney stones    History of peptic ulcer disease    Remote history   Hypercholesterolemia    Hypertension    Kidney stone    dr told me my kidney was loaded w/stones; imbedded   Labyrinthitis    Leg edema    Meralgia paresthetica    Mixed hyperlipidemia    Osteoarthrosis, unspecified whether generalized or localized, lower leg    Palpitations    Prostate cancer (HCC)    s/p prostatectomy   Type II diabetes mellitus (HCC)    dx 3 yrs ago     Prior to Admission  medications   Medication Sig Start Date End Date Taking? Authorizing Provider  amLODipine  (NORVASC ) 10 MG tablet TAKE 1 TABLET DAILY 06/13/22   [name] Victory ORN, MD  Aromatic Inhalants (VICKS VAPOINHALER IN) Inhale 1 puff into the lungs at bedtime.    [provider]  aspirin  EC 81 MG tablet Take 1 tablet (81 mg total) by mouth daily. Swallow whole. 07/17/23   Thukkani, Arun K, MD  Continuous Blood Gluc Receiver (FREESTYLE LIBRE 14 DAY READER) DEVI SMARTSIG:1 Patch(s) Topical As Directed 08/19/20   [provider]  Continuous Blood Gluc Sensor (FREESTYLE LIBRE 14 DAY SENSOR) MISC Apply topically. 01/28/21   [provider]  docusate sodium  (COLACE) 250 MG capsule Take 250 mg by mouth at bedtime.    [provider]  empagliflozin (JARDIANCE) 10 MG TABS tablet Take 2 tablets by mouth daily.    [provider]  furosemide  (LASIX ) 40 MG tablet Take 40 mg by mouth 2 (two) times daily.     [provider]  insulin  glargine (LANTUS) 100 UNIT/ML Solostar Pen Inject 38 Units into the skin daily.    [provider]  isosorbide  mononitrate (IMDUR ) 30 MG 24 hr tablet Take 30 mg by mouth daily.    [provider]  nitroGLYCERIN  (NITROSTAT ) 0.4  MG SL tablet Place 0.4 mg under the tongue every 5 (five) minutes as needed for chest pain.     [provider]  ONE TOUCH ULTRA TEST test strip  07/21/18   [provider]  rosuvastatin  (CRESTOR ) 10 MG tablet Take 10 mg by mouth daily.    [provider]  telmisartan  (MICARDIS ) 80 MG tablet Take 1 tablet (80 mg total) by mouth daily. 03/25/18   Darlean Ozell NOVAK, MD  traMADol  (ULTRAM ) 50 MG tablet Take 50 mg by mouth 2 (two) times daily as needed for moderate pain (arthritis back pain.).     [provider]  vitamin B-12 (CYANOCOBALAMIN) 1000 MCG tablet Take 1,000 mcg by mouth at bedtime.    [provider]  Vitamin D3 (VITAMIN D) 25 MCG tablet Take 1,000 Units by  mouth daily.    [provider]    Allergies: Amoxicillin, Oxycodone  hcl, and Simvastatin    Review of Systems  HENT:  Negative for sore throat.   Respiratory:  Positive for shortness of breath.   Gastrointestinal:  Negative for abdominal pain.  Neurological:  Positive for light-headedness. Negative for headaches.  All other systems reviewed and are negative.   Updated Vital Signs BP (!) 169/61   Pulse (!) 55   Temp 98.2 F (36.8 C) (Oral)   Resp 16   Ht 5' 8 (1.727 m)   Wt 97.5 kg   SpO2 100%   BMI 32.69 kg/m   Physical Exam Vitals and nursing note reviewed.  Constitutional:      General: He is not in acute distress.    Appearance: Normal appearance. He is well-developed. He is not diaphoretic.  HENT:     Head: Normocephalic and atraumatic.     Nose: Nose normal.  Eyes:     Conjunctiva/sclera: Conjunctivae normal.     Pupils: Pupils are equal, round, and reactive to light.  Cardiovascular:     Rate and Rhythm: Regular rhythm. Bradycardia present.     Pulses: Normal pulses.     Heart sounds: Normal heart sounds.  Pulmonary:     Effort: Pulmonary effort is normal.     Breath sounds: Normal breath sounds. No wheezing or rales.  Abdominal:     General: Bowel sounds are normal.     Palpations: Abdomen is soft.     Tenderness: There is no abdominal tenderness. There is no guarding or rebound.  Musculoskeletal:        General: Normal range of motion.     Cervical back: Normal range of motion and neck supple.  Skin:    General: Skin is warm and dry.     Capillary Refill: Capillary refill takes less than 2 seconds.  Neurological:     General: No focal deficit present.     Mental Status: He is alert and oriented to person, place, and time.     Deep Tendon Reflexes: Reflexes normal.  Psychiatric:        Mood and Affect: Mood normal.     (all labs ordered are listed, but only abnormal results are displayed) Results for orders placed or performed during  the hospital encounter of 05/25/24  Comprehensive metabolic panel   Collection Time: 05/25/24  6:44 PM  Result Value Ref Range   Sodium 137 135 - 145 mmol/L   Potassium 5.0 3.5 - 5.1 mmol/L   Chloride 105 98 - 111 mmol/L   CO2 23 22 - 32 mmol/L   Glucose, Bld 244 (H) 70 -  99 mg/dL   BUN 56 (H) 8 - 23 mg/dL   Creatinine, Ser 7.29 (H) 0.61 - 1.24 mg/dL   Calcium  9.3 8.9 - 10.3 mg/dL   Total Protein 6.8 6.5 - 8.1 g/dL   Albumin 3.6 3.5 - 5.0 g/dL   AST 25 15 - 41 U/L   ALT 19 0 - 44 U/L   Alkaline Phosphatase 52 38 - 126 U/L   Total Bilirubin 0.9 0.0 - 1.2 mg/dL   GFR, Estimated 22 (L) >60 mL/min   Anion gap 9 5 - 15  CBC with Differential   Collection Time: 05/25/24  6:44 PM  Result Value Ref Range   WBC 4.1 4.0 - 10.5 K/uL   RBC 3.58 (L) 4.22 - 5.81 MIL/uL   Hemoglobin 10.8 (L) 13.0 - 17.0 g/dL   HCT 66.9 (L) 60.9 - 47.9 %   MCV 92.2 80.0 - 100.0 fL   MCH 30.2 26.0 - 34.0 pg   MCHC 32.7 30.0 - 36.0 g/dL   RDW 85.7 88.4 - 84.4 %   Platelets 83 (L) 150 - 400 K/uL   nRBC 0.0 0.0 - 0.2 %   Neutrophils Relative % 52 %   Neutro Abs 2.1 1.7 - 7.7 K/uL   Lymphocytes Relative 31 %   Lymphs Abs 1.3 0.7 - 4.0 K/uL   Monocytes Relative 12 %   Monocytes Absolute 0.5 0.1 - 1.0 K/uL   Eosinophils Relative 4 %   Eosinophils Absolute 0.2 0.0 - 0.5 K/uL   Basophils Relative 1 %   Basophils Absolute 0.0 0.0 - 0.1 K/uL   Immature Granulocytes 0 %   Abs Immature Granulocytes 0.01 0.00 - 0.07 K/uL  Ferritin   Collection Time: 05/26/24  2:56 AM  Result Value Ref Range   Ferritin 17 (L) 24 - 336 ng/mL  Iron and TIBC   Collection Time: 05/26/24  2:56 AM  Result Value Ref Range   Iron 109 45 - 182 ug/dL   TIBC 592 749 - 549 ug/dL   Saturation Ratios 27 17.9 - 39.5 %   UIBC 298 ug/dL   DG Chest Portable 1 View Result Date: 05/26/2024 EXAM: 1 VIEW(S) XRAY OF THE CHEST 05/26/2024 01:28:00 AM COMPARISON: None available. CLINICAL HISTORY: painetc FINDINGS: LINES, TUBES AND DEVICES: Cardiac  monitor overlying left chest. LUNGS AND PLEURA: Low lung volumes. No focal pulmonary opacity. No pleural effusion. No pneumothorax. HEART AND MEDIASTINUM: Atherosclerotic plaque. No acute abnormality of the cardiac and mediastinal silhouettes. BONES AND SOFT TISSUES: No acute osseous abnormality. IMPRESSION: 1. No acute findings. 2. Low lung volumes. Electronically signed by: Dorethia Molt MD 05/26/2024 01:42 AM EST RP Workstation: [id number]   ECHOCARDIOGRAM COMPLETE Result Date: 05/19/2024    ECHOCARDIOGRAM REPORT   Patient Name:   Ryan Hunt Date of Exam: 05/19/2024 Medical Rec #:  [id number]       Height:       68.0 in Accession #:    [id number]      Weight:       221.6 lb Date of Birth:  [birth year]       BSA:          2.135 m Patient Age:    89 years        BP:           170/58 mmHg Patient Gender: M               HR:           45  bpm. Exam Location:  Outpatient Procedure: 2D Echo and Strain Analysis (Both Spectral and Color Flow Doppler            were utilized during procedure). Indications:    Aortic valve sclerosis  History:        Patient has prior history of Echocardiogram examinations. CHF,                 CAD; Risk Factors:Hypertension.  Sonographer:    Charmaine Gaskins Referring Phys: [phone] RONALD POLITE IMPRESSIONS  1. Some flow acceleration through LVOT but no SAM or valvular stenosis Likely from basal septal hypertrophy. Left ventricular ejection fraction, by estimation, is 60 to 65%. The left ventricle has normal function. The left ventricle has no regional wall  motion abnormalities. There is moderate left ventricular hypertrophy. Left ventricular diastolic parameters were normal. The average left ventricular global longitudinal strain is -18.2 %. The global longitudinal strain is normal.  2. Right ventricular systolic function is normal. The right ventricular size is normal.  3. Left atrial size was moderately dilated.  4. The mitral valve is abnormal. Mild mitral valve regurgitation. No  evidence of mitral stenosis.  5. Tricuspid valve regurgitation is mild to moderate.  6. The aortic valve is tricuspid. There is moderate calcification of the aortic valve. There is moderate thickening of the aortic valve. Aortic valve regurgitation is not visualized. Aortic valve sclerosis/calcification is present, without any evidence of aortic stenosis.  7. The inferior vena cava is normal in size with greater than 50% respiratory variability, suggesting right atrial pressure of 3 mmHg. FINDINGS  Left Ventricle: Some flow acceleration through LVOT but no SAM or valvular stenosis Likely from basal septal hypertrophy. Left ventricular ejection fraction, by estimation, is 60 to 65%. The left ventricle has normal function. The left ventricle has no regional wall motion abnormalities. The average left ventricular global longitudinal strain is -18.2 %. Strain was performed and the global longitudinal strain is normal. The left ventricular internal cavity size was normal in size. There is moderate left ventricular hypertrophy. Left ventricular diastolic parameters were normal. Right Ventricle: The right ventricular size is normal. No increase in right ventricular wall thickness. Right ventricular systolic function is normal. Left Atrium: Left atrial size was moderately dilated. Right Atrium: Right atrial size was normal in size. Pericardium: There is no evidence of pericardial effusion. Mitral Valve: The mitral valve is abnormal. There is mild thickening of the mitral valve leaflet(s). There is mild calcification of the mitral valve leaflet(s). Mild mitral valve regurgitation. No evidence of mitral valve stenosis. Tricuspid Valve: The tricuspid valve is normal in structure. Tricuspid valve regurgitation is mild to moderate. No evidence of tricuspid stenosis. Aortic Valve: The aortic valve is tricuspid. There is moderate calcification of the aortic valve. There is moderate thickening of the aortic valve. Aortic valve  regurgitation is not visualized. Aortic valve sclerosis/calcification is present, without any  evidence of aortic stenosis. Aortic valve mean gradient measures 7.0 mmHg. Aortic valve peak gradient measures 13.7 mmHg. Aortic valve area, by VTI measures 2.81 cm. Pulmonic Valve: The pulmonic valve was normal in structure. Pulmonic valve regurgitation is mild. No evidence of pulmonic stenosis. Aorta: The aortic root is normal in size and structure. Venous: The inferior vena cava is normal in size with greater than 50% respiratory variability, suggesting right atrial pressure of 3 mmHg. IAS/Shunts: No atrial level shunt detected by color flow Doppler. Additional Comments: 3D was performed not requiring image post processing on an independent workstation and  was indeterminate.  LEFT VENTRICLE PLAX 2D LVIDd:         5.30 cm   Diastology LVIDs:         2.90 cm   LV e' medial:    6.85 cm/s LV PW:         1.40 cm   LV E/e' medial:  12.1 LV IVS:        1.50 cm   LV e' lateral:   7.51 cm/s LVOT diam:     2.03 cm   LV E/e' lateral: 11.0 LV SV:         125 LV SV Index:   59        2D Longitudinal Strain LVOT Area:     3.24 cm  2D Strain GLS Avg:     -18.2 %  RIGHT VENTRICLE RV Basal diam:  3.33 cm RV Mid diam:    3.57 cm RV S prime:     13.60 cm/s LEFT ATRIUM             Index        RIGHT ATRIUM           Index LA diam:        4.75 cm 2.23 cm/m   RA Area:     15.70 cm LA Vol (A2C):   64.9 ml 30.40 ml/m  RA Volume:   35.00 ml  16.40 ml/m LA Vol (A4C):   68.9 ml 32.27 ml/m LA Biplane Vol: 69.4 ml 32.51 ml/m  AORTIC VALVE AV Area (Vmax):    2.76 cm AV Area (Vmean):   3.21 cm AV Area (VTI):     2.81 cm AV Vmax:           185.00 cm/s AV Vmean:          126.000 cm/s AV VTI:            0.445 m AV Peak Grad:      13.7 mmHg AV Mean Grad:      7.0 mmHg LVOT Vmax:         158.00 cm/s LVOT Vmean:        125.000 cm/s LVOT VTI:          0.386 m LVOT/AV VTI ratio: 0.87  AORTA Ao Root diam: 3.61 cm Ao Asc diam:  3.74 cm MITRAL VALVE                 TRICUSPID VALVE MV Area (PHT): 2.64 cm     TR Peak grad:   30.7 mmHg MV Decel Time: 287 msec     TR Vmax:        277.00 cm/s MV E velocity: 82.80 cm/s MV A velocity: 101.00 cm/s  SHUNTS MV E/A ratio:  0.82         Systemic VTI:  0.39 m                             Systemic Diam: 2.03 cm Maude Emmer MD Electronically signed by Maude Emmer MD Signature Date/Time: 05/19/2024/2:34:03 PM    Final     EKG: EKG Interpretation Date/Time:  Monday May 25 2024 18:34:26 EST Ventricular Rate:  50 PR Interval:    QRS Duration:  142 QT Interval:  486 QTC Calculation: 443 R Axis:   79  Text Interpretation: Sinus bradycardia with first degree AVB and bigeminy Non-specific intra-ventricular conduction block Confirmed by Geralda Baumgardner ([id number]) on 05/25/2024 11:55:52  PM  Radiology: DG Chest Portable 1 View Result Date: 05/26/2024 EXAM: 1 VIEW(S) XRAY OF THE CHEST 05/26/2024 01:28:00 AM COMPARISON: None available. CLINICAL HISTORY: painetc FINDINGS: LINES, TUBES AND DEVICES: Cardiac monitor overlying left chest. LUNGS AND PLEURA: Low lung volumes. No focal pulmonary opacity. No pleural effusion. No pneumothorax. HEART AND MEDIASTINUM: Atherosclerotic plaque. No acute abnormality of the cardiac and mediastinal silhouettes. BONES AND SOFT TISSUES: No acute osseous abnormality. IMPRESSION: 1. No acute findings. 2. Low lung volumes. Electronically signed by: Dorethia Molt MD 05/26/2024 01:42 AM EST RP Workstation: [id number]     Procedures   Medications Ordered in the ED - No data to display                                  Medical Decision Making Patient sent in by PMD for low HR  Amount and/or Complexity of Data Reviewed Independent Historian:     Details: Family member see above  External Data Reviewed: notes.    Details: Previous notes reviewed  Labs: ordered.    Details: Normal sodium 137, normal potassium 5, elevated creatinine 2.7. normal white count 4.1, low hemoglobin  10.8 Radiology: ordered and independent interpretation performed.    Details: No CHF ECG/medicine tests: ordered and independent interpretation performed. Decision-making details documented in ED Course. Discussion of management or test interpretation with external provider(s): Case d/w Dr. Gail of cardiology who saw patient and left consult note   Risk Decision regarding hospitalization.     Final diagnoses:  Symptomatic bradycardia    ED Discharge Orders     None          Leilanee Righetti, MD 05/26/24 [phone]

## 2024-05-26 NOTE — Consult Note (Addendum)
 Cardiology Consultation   Patient ID: Ryan Hunt MRN: [id number]; DOB: Dec 15, 1930  Admit date: 05/25/2024 Date of Consult: 05/26/2024  PCP:  Rexanne Ingle, MD   [facility] Providers Cardiologist:  Victory LELON [name] [name], MD (Inactive)        Patient Profile: Ryan Hunt is a 89 y.o. male with a hx of coronary artery disease, first-degree AV block, left bundle branch block, type 2 diabetes, CKD, hypertension, hyperlipidemia, chronic diastolic heart failure who is being seen 05/26/2024 for the evaluation of bradycardia at the request of the Emergency Department.  History of Present Illness: Ryan Hunt was instructed to come to the emergency department by the device rep's, who noted at least 11 abrupt decreases in his heart rate captured on his Zio patch.  Patient states he was not aware of his drop in his heart rates at this time.  Patient has had worsening fatigue over the past year.  Specifically, he has fatigue with moderate moderate exertion that has particularly worsened over the past few months.  He also notes some shortness of breath during this period.  He is able to perform his usual daily activities at home without any issues, and is usually very functional for his age.  Review of systems is negative for syncope, chest pain.  Patient follows up with Dr. Wendel for management of coronary artery disease, which she had a prior stent placed sometime ago.  He has a chronic left bundle branch block and a first-degree AV block.  Patient checks his blood pressures regularly.  He has not been told that he has had low heart rates in the past.  Patient was recently found to have bradycardia.  He had a Zio patch placed this past Wednesday.  He had a recent T TTE that showed normal EF of 60 to 65%, normal RV function, mild valvular disease.   Past Medical History:  Diagnosis Date   Anemia    Anginal pain    Arthritis    knees   Bronchitis 11/2011   first time for me    CAD (coronary artery disease)    With DES circumflex 02/2009   CHF (congestive heart failure) (HCC)    Coronary atherosclerosis of native coronary artery    Diabetes mellitus with kidney disease (HCC)    Diastolic heart failure (HCC)    Diverticulosis of colon (without mention of hemorrhage)    Dyspnea    Exertional dyspnea    Chronic   GERD (gastroesophageal reflux disease)    History of kidney stones    History of peptic ulcer disease    Remote history   Hypercholesterolemia    Hypertension    Kidney stone    dr told me my kidney was loaded w/stones; imbedded   Labyrinthitis    Leg edema    Meralgia paresthetica    Mixed hyperlipidemia    Osteoarthrosis, unspecified whether generalized or localized, lower leg    Palpitations    Prostate cancer (HCC)    s/p prostatectomy   Type II diabetes mellitus (HCC)    dx 3 yrs ago    Past Surgical History:  Procedure Laterality Date   BACK SURGERY     BIOPSY  04/09/2019   Procedure: BIOPSY;  Surgeon: Celestia Lynwood, MD;  Location: [facility];  Service: Endoscopy;;   CARDIAC CATHETERIZATION     CATARACT EXTRACTION W/ INTRAOCULAR LENS IMPLANT & ANTERIOR VITRECTOMY, BILATERAL  1990's   COLONOSCOPY     COLONOSCOPY WITH PROPOFOL  N/A  04/09/2019   Procedure: COLONOSCOPY WITH PROPOFOL ;  Surgeon: Celestia Agent, MD;  Location: [facility];  Service: Endoscopy;  Laterality: N/A;   COLONOSCOPY WITH PROPOFOL  N/A 08/31/2019   Procedure: COLONOSCOPY WITH PROPOFOL ;  Surgeon: Elicia Claw, MD;  Location: [facility];  Service: Gastroenterology;  Laterality: N/A;   COLONOSCOPY WITH PROPOFOL  N/A 03/29/2020   Procedure: COLONOSCOPY WITH PROPOFOL ;  Surgeon: Elicia Claw, MD;  Location: [facility];  Service: Gastroenterology;  Laterality: N/A;   CORONARY ANGIOPLASTY WITH STENT PLACEMENT  02/2009   Stent x 1 (DES circumflex)   DIAGNOSTIC LAPAROSCOPY     ESOPHAGOGASTRODUODENOSCOPY (EGD) WITH PROPOFOL  N/A 04/09/2019   Procedure:  ESOPHAGOGASTRODUODENOSCOPY (EGD) WITH PROPOFOL ;  Surgeon: Celestia Agent, MD;  Location: [facility];  Service: Endoscopy;  Laterality: N/A;   EYE SURGERY     bil lens implants   HEMOSTASIS CLIP PLACEMENT  08/31/2019   Procedure: HEMOSTASIS CLIP PLACEMENT;  Surgeon: Elicia Claw, MD;  Location: [facility];  Service: Gastroenterology;;  Ascending Polyp    HEMOSTASIS CONTROL  03/29/2020   Procedure: HEMOSTASIS CONTROL;  Surgeon: Elicia Claw, MD;  Location: [facility];  Service: Gastroenterology;;   JOINT REPLACEMENT     s/p left knee   LASIK Bilateral    LEFT HEART CATH AND CORONARY ANGIOGRAPHY N/A 02/27/2018   Procedure: LEFT HEART CATH AND CORONARY ANGIOGRAPHY;  Surgeon: [name] Victory ORN, MD;  Location: [facility];  Service: Cardiovascular;  Laterality: N/A;   LUMBAR LAMINECTOMY/DECOMPRESSION MICRODISCECTOMY N/A 12/23/2014   Procedure:  L2-3 DECOMPRESSION;  Surgeon: Donaciano Sprang, MD;  Location: [facility];  Service: Orthopedics;  Laterality: N/A;   POLYPECTOMY  04/09/2019   Procedure: POLYPECTOMY;  Surgeon: Celestia Agent, MD;  Location: [facility];  Service: Endoscopy;;   POLYPECTOMY  08/31/2019   Procedure: POLYPECTOMY;  Surgeon: Elicia Claw, MD;  Location: [facility];  Service: Gastroenterology;;   POLYPECTOMY  03/29/2020   Procedure: POLYPECTOMY;  Surgeon: Elicia Claw, MD;  Location: [facility];  Service: Gastroenterology;;   PROSTATECTOMY  2009   Robotic prostatectomy    Skin Lumps Removed     TOTAL KNEE ARTHROPLASTY  12/31/2011   Procedure: TOTAL KNEE ARTHROPLASTY;  Surgeon: Lamar DELENA Millman, MD;  Location: [facility];  Service: Orthopedics;  Laterality: Left;  Left total knee arthroplasty   TOTAL SHOULDER REPLACEMENT  1990's   bilaterally   TRIGGER FINGER RELEASE Left 02/14/2021   Procedure: RELEASE TRIGGER FINGER/A-1 PULLEY LEFT RING FINGER;  Surgeon: Murrell Kuba, MD;  Location: [facility];  Service: Orthopedics;  Laterality: Left;  45 MIN      Home Medications:  Prior to Admission medications   Medication Sig Start Date End Date Taking? Authorizing Provider  amLODipine  (NORVASC ) 10 MG tablet TAKE 1 TABLET DAILY 06/13/22   [name] Victory ORN, MD  Aromatic Inhalants (VICKS VAPOINHALER IN) Inhale 1 puff into the lungs at bedtime.    [provider]  aspirin  EC 81 MG tablet Take 1 tablet (81 mg total) by mouth daily. Swallow whole. 07/17/23   Thukkani, Arun K, MD  Continuous Blood Gluc Receiver (FREESTYLE LIBRE 14 DAY READER) DEVI SMARTSIG:1 Patch(s) Topical As Directed 08/19/20   [provider]  Continuous Blood Gluc Sensor (FREESTYLE LIBRE 14 DAY SENSOR) MISC Apply topically. 01/28/21   [provider]  docusate sodium  (COLACE) 250 MG capsule Take 250 mg by mouth at bedtime.    [provider]  empagliflozin (JARDIANCE) 10 MG TABS tablet Take 2 tablets by mouth daily.    [provider]  furosemide  (LASIX ) 40 MG tablet Take 40 mg by mouth 2 (two) times daily.     [provider]  insulin  glargine (LANTUS) 100 UNIT/ML Solostar Pen Inject 38 Units into the skin daily.    [provider]  isosorbide  mononitrate (IMDUR ) 30 MG 24 hr tablet Take 30 mg by mouth daily.    [provider]  nitroGLYCERIN  (NITROSTAT ) 0.4 MG SL tablet Place 0.4 mg under the tongue every 5 (five) minutes as needed for chest pain.     [provider]  ONE TOUCH ULTRA TEST test strip  07/21/18   [provider]  rosuvastatin  (CRESTOR ) 10 MG tablet Take 10 mg by mouth daily.    [provider]  telmisartan  (MICARDIS ) 80 MG tablet Take 1 tablet (80 mg total) by mouth daily. 03/25/18   Darlean Ozell NOVAK, MD  traMADol  (ULTRAM ) 50 MG tablet Take 50 mg by mouth 2 (two) times daily as needed for moderate pain (arthritis back pain.).     [provider]  vitamin B-12 (CYANOCOBALAMIN) 1000 MCG tablet Take 1,000 mcg by mouth at bedtime.    [provider]  Vitamin D3  (VITAMIN D) 25 MCG tablet Take 1,000 Units by mouth daily.    [provider]    Scheduled Meds:  Continuous Infusions:  PRN Meds:   Allergies:    Allergies  Allergen Reactions   Amoxicillin Hives, Rash and Other (See Comments)    sores in my mouth Did it involve swelling of the face/tongue/throat, SOB, or low BP? Yes Did it involve sudden or severe rash/hives, skin peeling, or any reaction on the inside of your mouth or nose? Yes Did you need to seek medical attention at a hospital or doctor's office? Unknown When did it last happen?at least 10 years ago If all above answers are "NO", may proceed with cephalosporin use.    Oxycodone  Hcl     agitation   Simvastatin Itching    Social History:   Social History   Socioeconomic History   Marital status: Widowed    Spouse name: Not on file   Number of children: 1   Years of education: Not on file   Highest education level: Not on file  Occupational History   Occupation: Retired  Tobacco Use   Smoking status: Former    Current packs/day: 0.00    Average packs/day: 0.5 packs/day for 15.0 years (7.5 ttl pk-yrs)    Types: Cigarettes    Start date: 07/09/1956    Quit date: 07/10/1971    Years since quitting: 52.9   Smokeless tobacco: Never  Vaping Use   Vaping status: Never Used  Substance and Sexual Activity   Alcohol use: Yes    Comment: 01/01/12 occassionall have a glass of beer or wine; not even once a week   Drug use: No   Sexual activity: Not Currently  Other Topics Concern   Not on file  Social History Narrative   Not on file   Social Drivers of Health   Financial Resource Strain: Low Risk  (02/27/2018)   Overall Financial Resource Strain (CARDIA)    Difficulty of Paying Living Expenses: Not hard at all  Food Insecurity: No Food Insecurity (02/27/2018)   Hunger Vital Sign    Worried About Running Out of Food in the Last Year: Never true    Ran Out of Food in the Last Year: Never true   Transportation Needs: No Transportation Needs (02/27/2018)   PRAPARE - Transportation  Lack of Transportation (Medical): No    Lack of Transportation (Non-Medical): No  Physical Activity: Inactive (02/27/2018)   Exercise Vital Sign    Days of Exercise per Week: 0 days    Minutes of Exercise per Session: 0 min  Stress: No Stress Concern Present (02/27/2018)   [employer]ational [employer] - Occupational Stress Questionnaire    Feeling of Stress : Not at all  Social Connections: Not on file  Intimate Partner Violence: Not At Risk (02/27/2018)   Humiliation, Afraid, Rape, and Kick questionnaire    Fear of Current or Ex-Partner: No    Emotionally Abused: No    Physically Abused: No    Sexually Abused: No    Family History:    Family History  Problem Relation Age of Onset   CVA Father    CAD Father    Prostate cancer Father    Heart disease Father        ASHD   Stroke Father    Nephritis Brother    COPD Brother    CVA Mother    Tremor Mother    Dementia Mother    Heart attack Neg Hx      ROS:  Please see the history of present illness.   All other ROS reviewed and negative.     Physical Exam/Data: Vitals:   05/25/24 1818 05/25/24 2135 05/26/24 0030 05/26/24 0145  BP:  (!) 172/53 (!) 165/53 (!) 142/52  Pulse:  (!) 52 (!) 49 84  Resp:  18 13 20   Temp:  98.3 F (36.8 C)    SpO2:  97% 100% 98%  Weight: 97.5 kg     Height: 5' 8 (1.727 m)      No intake or output data in the 24 hours ending 05/26/24 0226    05/25/2024    6:18 PM 07/17/2023   10:38 AM 05/03/2022   11:04 AM  Last 3 Weights  Weight (lbs) 215 lb 221 lb 9.6 oz 216 lb  Weight (kg) 97.523 kg 100.517 kg 97.977 kg     Body mass index is 32.69 kg/m.  General:  Well nourished, well developed, in no acute distress HEENT: normal Neck: no JVD Vascular: No carotid bruits; Distal pulses 2+ bilaterally Cardiac:  II/VI systolic murmur Lungs:  clear to auscultation bilaterally, no wheezing, rhonchi  or rales  Abd: soft, nontender, no hepatomegaly  Ext: no edema Musculoskeletal:  No deformities, BUE and BLE strength normal and equal Skin: warm and dry  Neuro:  CNs 2-12 intact, no focal abnormalities noted Psych:  Normal affect   EKG:  The EKG was personally reviewed and demonstrates:  SB with possible ectopic atrial beats Telemetry:  Telemetry was personally reviewed and demonstrates:  SB  Relevant CV Studies:  reviewed  Laboratory Data: High Sensitivity Troponin:  No results for input(s): TROPONINIHS in the last 720 hours.   Chemistry Recent Labs  Lab 05/25/24 1844  NA 137  K 5.0  CL 105  CO2 23  GLUCOSE 244*  BUN 56*  CREATININE 2.70*  CALCIUM  9.3  GFRNONAA 22*  ANIONGAP 9    Recent Labs  Lab 05/25/24 1844  PROT 6.8  ALBUMIN 3.6  AST 25  ALT 19  ALKPHOS 52  BILITOT 0.9   Lipids No results for input(s): CHOL, TRIG, HDL, LABVLDL, LDLCALC, CHOLHDL in the last 168 hours.  Hematology Recent Labs  Lab 05/25/24 1844  WBC 4.1  RBC 3.58*  HGB 10.8*  HCT 33.0*  MCV 92.2  MCH 30.2  MCHC 32.7  RDW 14.2  PLT 83*   Thyroid  No results for input(s): TSH, FREET4 in the last 168 hours.  BNPNo results for input(s): BNP, PROBNP in the last 168 hours.  DDimer No results for input(s): DDIMER in the last 168 hours.  Radiology/Studies:  DG Chest Portable 1 View Result Date: 05/26/2024 EXAM: 1 VIEW(S) XRAY OF THE CHEST 05/26/2024 01:28:00 AM COMPARISON: None available. CLINICAL HISTORY: painetc FINDINGS: LINES, TUBES AND DEVICES: Cardiac monitor overlying left chest. LUNGS AND PLEURA: Low lung volumes. No focal pulmonary opacity. No pleural effusion. No pneumothorax. HEART AND MEDIASTINUM: Atherosclerotic plaque. No acute abnormality of the cardiac and mediastinal silhouettes. BONES AND SOFT TISSUES: No acute osseous abnormality. IMPRESSION: 1. No acute findings. 2. Low lung volumes. Electronically signed by: Dorethia Molt MD 05/26/2024 01:42 AM  EST RP Workstation: [id number]     Assessment and Plan:  Ryan Hunt is a 89 y.o. male with a hx of coronary artery disease, first-degree AV block, left bundle branch block, type 2 diabetes, CKD, hypertension, hyperlipidemia, chronic diastolic heart failure who is being seen 05/26/2024 for the evaluation of bradycardia at the request of the Emergency Department.  Telemetry demonstrates sinus bradycardia with possible ectopic atrial beats.  He has a persistent left bundle branch block and first-degree AV block (PR approximately 240 msec).  Hemoglobin on admission 10.8.  Would evaluate for symptomatic anemia, although his symptoms are concerning for sick sinus syndreom.  Will have EP see him in the morning and interrogate his Holter monitor.  If he has evidence of sinus node dysfunction or other conduction disease, he may require permanent pacemaker.  Recommendations -admit to medicine service -iron studies ordered -monitor on telemetry -will have EP evaluate in the AM -avoid AV nodal blocking agents   Risk Assessment/Risk Scores:              For questions or updates, please contact [facility] Please consult [email or web address] for contact info under      Signed, Jaydee Conran A Thania Woodlief, MD  05/26/2024 2:26 AM

## 2024-05-26 NOTE — H&P (Signed)
 History and Physical    Patient: Ryan Hunt FMW:[id number] DOB: [birth year] DOA: 05/25/2024 DOS: the patient was seen and examined on 05/26/2024 PCP: Rexanne Ingle, MD  Patient coming from: Home  Chief Complaint:  Chief Complaint  Patient presents with   Bradycardia   Hypotension   Shortness of Breath   HPI: Ryan Hunt is a 89 y.o. male with medical history significant of hypertension, hyperlipidemia, CAD, chronic diastolic congestive heart failure, diabetes mellitus type 2, chronic kidney disease stage IV presented with complaints of dizziness and lightheadedness upon standing.  He experiences dizziness and lightheadedness upon standing, which has been a persistent issue.  Denies any loss of consciousness or falls.  Recently, he was set up with a Holter monitor by his primary care provider due to these symptoms. The monitor recorded his heart rate dropping 10-11 times the previous day, his son was called and advised to bring him to the hospital.  Over the past six months, he has noticed a significant decline in his ability to perform work around the house.  He reports feeling fatigued short of breath with exertion. He describes himself as a 'workaholic' but now finds himself unable to maintain his usual activities.  No swelling, chest pain, or palpitations. He does report experiencing some shortness of breath upon exertion. However, he does report experiencing some shortness of breath upon exertion.  He has no history of smoking and uses a left-handed cane for  for mobility, which he finds very comfortable and supportive.  In the emergency department patient was noted to have heart rates as low as 48 with blood pressures currently maintained.  Labs since yesterday noted WBC 4.1, hemoglobin 10.8, platelets 83, BUN 56, creatinine 2.7.  Chest x-ray noted low lung volumes with no other acute findings.  Cardiology was formally consulted and recommended admission for likely need of  pacemaker placement.  Review of Systems: As mentioned in the history of present illness. All other systems reviewed and are negative. Past Medical History:  Diagnosis Date   Anemia    Anginal pain    Arthritis    knees   Bronchitis 11/2011   first time for me   CAD (coronary artery disease)    With DES circumflex 02/2009   CHF (congestive heart failure) (HCC)    Coronary atherosclerosis of native coronary artery    Diabetes mellitus with kidney disease (HCC)    Diastolic heart failure (HCC)    Diverticulosis of colon (without mention of hemorrhage)    Dyspnea    Exertional dyspnea    Chronic   GERD (gastroesophageal reflux disease)    History of kidney stones    History of peptic ulcer disease    Remote history   Hypercholesterolemia    Hypertension    Kidney stone    dr told me my kidney was loaded w/stones; imbedded   Labyrinthitis    Leg edema    Meralgia paresthetica    Mixed hyperlipidemia    Osteoarthrosis, unspecified whether generalized or localized, lower leg    Palpitations    Prostate cancer (HCC)    s/p prostatectomy   Type II diabetes mellitus (HCC)    dx 3 yrs ago   Past Surgical History:  Procedure Laterality Date   BACK SURGERY     BIOPSY  04/09/2019   Procedure: BIOPSY;  Surgeon: Celestia Lynwood, MD;  Location: [facility];  Service: Endoscopy;;   CARDIAC CATHETERIZATION     CATARACT EXTRACTION W/ INTRAOCULAR LENS IMPLANT &  ANTERIOR VITRECTOMY, BILATERAL  1990's   COLONOSCOPY     COLONOSCOPY WITH PROPOFOL  N/A 04/09/2019   Procedure: COLONOSCOPY WITH PROPOFOL ;  Surgeon: Celestia Agent, MD;  Location: [facility];  Service: Endoscopy;  Laterality: N/A;   COLONOSCOPY WITH PROPOFOL  N/A 08/31/2019   Procedure: COLONOSCOPY WITH PROPOFOL ;  Surgeon: Elicia Claw, MD;  Location: [facility];  Service: Gastroenterology;  Laterality: N/A;   COLONOSCOPY WITH PROPOFOL  N/A 03/29/2020   Procedure: COLONOSCOPY WITH PROPOFOL ;  Surgeon: Elicia Claw,  MD;  Location: [facility];  Service: Gastroenterology;  Laterality: N/A;   CORONARY ANGIOPLASTY WITH STENT PLACEMENT  02/2009   Stent x 1 (DES circumflex)   DIAGNOSTIC LAPAROSCOPY     ESOPHAGOGASTRODUODENOSCOPY (EGD) WITH PROPOFOL  N/A 04/09/2019   Procedure: ESOPHAGOGASTRODUODENOSCOPY (EGD) WITH PROPOFOL ;  Surgeon: Celestia Agent, MD;  Location: [facility];  Service: Endoscopy;  Laterality: N/A;   EYE SURGERY     bil lens implants   HEMOSTASIS CLIP PLACEMENT  08/31/2019   Procedure: HEMOSTASIS CLIP PLACEMENT;  Surgeon: Elicia Claw, MD;  Location: [facility];  Service: Gastroenterology;;  Ascending Polyp    HEMOSTASIS CONTROL  03/29/2020   Procedure: HEMOSTASIS CONTROL;  Surgeon: Elicia Claw, MD;  Location: [facility];  Service: Gastroenterology;;   JOINT REPLACEMENT     s/p left knee   LASIK Bilateral    LEFT HEART CATH AND CORONARY ANGIOGRAPHY N/A 02/27/2018   Procedure: LEFT HEART CATH AND CORONARY ANGIOGRAPHY;  Surgeon: [name] Victory ORN, MD;  Location: [facility];  Service: Cardiovascular;  Laterality: N/A;   LUMBAR LAMINECTOMY/DECOMPRESSION MICRODISCECTOMY N/A 12/23/2014   Procedure:  L2-3 DECOMPRESSION;  Surgeon: Donaciano Sprang, MD;  Location: [facility];  Service: Orthopedics;  Laterality: N/A;   POLYPECTOMY  04/09/2019   Procedure: POLYPECTOMY;  Surgeon: Celestia Agent, MD;  Location: [facility];  Service: Endoscopy;;   POLYPECTOMY  08/31/2019   Procedure: POLYPECTOMY;  Surgeon: Elicia Claw, MD;  Location: [facility];  Service: Gastroenterology;;   POLYPECTOMY  03/29/2020   Procedure: POLYPECTOMY;  Surgeon: Elicia Claw, MD;  Location: [facility];  Service: Gastroenterology;;   PROSTATECTOMY  2009   Robotic prostatectomy    Skin Lumps Removed     TOTAL KNEE ARTHROPLASTY  12/31/2011   Procedure: TOTAL KNEE ARTHROPLASTY;  Surgeon: Lamar DELENA Millman, MD;  Location: [facility];  Service: Orthopedics;  Laterality: Left;  Left total knee arthroplasty   TOTAL  SHOULDER REPLACEMENT  1990's   bilaterally   TRIGGER FINGER RELEASE Left 02/14/2021   Procedure: RELEASE TRIGGER FINGER/A-1 PULLEY LEFT RING FINGER;  Surgeon: Murrell Kuba, MD;  [facility];  Service: Orthopedics;  Laterality: Left;  45 MIN   Social History:  reports that he quit smoking about 52 years ago. His smoking use included cigarettes. He started smoking about 67 years ago. He has a 7.5 pack-year smoking history. He has never used smokeless tobacco. He reports current alcohol use. He reports that he does not use drugs.  Allergies  Allergen Reactions   Amoxicillin Hives, Rash and Other (See Comments)    sores in my mouth Did it involve swelling of the face/tongue/throat, SOB, or low BP? Yes Did it involve sudden or severe rash/hives, skin peeling, or any reaction on the inside of your mouth or nose? Yes Did you need to seek medical attention at a hospital or doctor's office? Unknown When did it last happen?at least 10 years ago If all above answers are "NO", may proceed with cephalosporin use.    Oxycodone  Hcl  agitation   Simvastatin Itching    Family History  Problem Relation Age of Onset   CVA Father    CAD Father    Prostate cancer Father    Heart disease Father        ASHD   Stroke Father    Nephritis Brother    COPD Brother    CVA Mother    Tremor Mother    Dementia Mother    Heart attack Neg Hx     Prior to Admission medications   Medication Sig Start Date End Date Taking? Authorizing Provider  amLODipine  (NORVASC ) 10 MG tablet TAKE 1 TABLET DAILY 06/13/22   [name] Victory ORN, MD  Aromatic Inhalants (VICKS VAPOINHALER IN) Inhale 1 puff into the lungs at bedtime.    [provider]  aspirin  EC 81 MG tablet Take 1 tablet (81 mg total) by mouth daily. Swallow whole. 07/17/23   Thukkani, Arun K, MD  Continuous Blood Gluc Receiver (FREESTYLE LIBRE 14 DAY READER) DEVI SMARTSIG:1 Patch(s) Topical As Directed 08/19/20   [provider]  Continuous Blood Gluc Sensor (FREESTYLE LIBRE 14 DAY SENSOR) MISC Apply topically. 01/28/21   [provider]  docusate sodium  (COLACE) 250 MG capsule Take 250 mg by mouth at bedtime.    [provider]  empagliflozin (JARDIANCE) 10 MG TABS tablet Take 2 tablets by mouth daily.    [provider]  furosemide  (LASIX ) 40 MG tablet Take 40 mg by mouth 2 (two) times daily.     [provider]  insulin  glargine (LANTUS) 100 UNIT/ML Solostar Pen Inject 38 Units into the skin daily.    [provider]  isosorbide  mononitrate (IMDUR ) 30 MG 24 hr tablet Take 30 mg by mouth daily.    [provider]  nitroGLYCERIN  (NITROSTAT ) 0.4 MG SL tablet Place 0.4 mg under the tongue every 5 (five) minutes as needed for chest pain.     [provider]  ONE TOUCH ULTRA TEST test strip  07/21/18   [provider]  rosuvastatin  (CRESTOR ) 10 MG tablet Take 10 mg by mouth daily.    [provider]  telmisartan  (MICARDIS ) 80 MG tablet Take 1 tablet (80 mg total) by mouth daily. 03/25/18   Darlean Ozell NOVAK, MD  traMADol  (ULTRAM ) 50 MG tablet Take 50 mg by mouth 2 (two) times daily as needed for moderate pain (arthritis back pain.).     [provider]  vitamin B-12 (CYANOCOBALAMIN) 1000 MCG tablet Take 1,000 mcg by mouth at bedtime.    [provider]  Vitamin D3 (VITAMIN D) 25 MCG tablet Take 1,000 Units by mouth daily.    [provider]    Physical Exam: Vitals:   05/26/24 0700 05/26/24 0715 05/26/24 0727 05/26/24 0730  BP: (!) 132/48 129/79  (!) 146/43  Pulse: (!) 53 63  (!) 48  Resp: (!) 24 (!) 25  18  Temp:   (!) 97.5 F (36.4 C)   TempSrc:   Oral   SpO2: 98% 100%  98%  Weight:      Height:         Constitutional: Elderly male currently in no acute distress Eyes: PERRL, lids and conjunctivae normal ENMT: Mucous membranes are moist.  .Normal dentition.  Neck: normal, supple.  No JVD  present. Respiratory: clear to auscultation bilaterally, no wheezing, no crackles.   Patient speaks in shortened sentences and seems somewhat labored with speaking. Cardiovascular: Bradycardic.  Lower extremity edema present bilaterally Abdomen: no  tenderness, no masses palpated.  Bowel sounds positive.  Musculoskeletal: no clubbing / cyanosis. No joint deformity upper and lower extremities. Good ROM, no contractures. Normal muscle tone.  Skin: no rashes, lesions, ulcers. No induration Neurologic: CN 2-12 grossly intact.  Strength 5/5 in all 4.  Psychiatric: Normal judgment and insight. Alert and oriented x 3. Normal mood.  Data Reviewed:  EKG reveals sinus bradycardia with first-degree AV block and bigeminy 50 bpm.  Reviewed labs, imaging, and pertinent records as documented  Assessment and Plan:  Sinus bradycardia Patient presents with complaints of fatigue and lightheadedness with standing.  Found to have heart rates in the 40-50s here in the emergency department.  Patient is not on any beta blocking agents.  Blood pressures currently maintained.   - Admit to a telemetry bed - Check orthostatic vitals - Check UDY(8.207) - Follow-up telemetry overnight. - Appreciate cardiology consultative services,  will follow-up for any further recommendation  Heart failure with preserved ejection fraction Acute on chronic.  Patient noted to have 2+ edema lower extremities.  Patient just recently had echocardiogram revealed EF 60 to 65% with normal diastolic parameters, normal RV function, no aortic stenosis. - Strict I&O's and daily weight - Check BNP (133.8) - Lasix  40 mg IV given by cardiology.  Pancytopenia Acute on chronic.  On admission white blood cell count noted to be 3.5 with hemoglobin 9.4, and platelets 78.  All 3 cell lines appear to be acutely lower which this may be secondary to a dilution effect.  Vital signs remained stable at this time. - Recheck CBC tomorrow morning  Essential  hypertension Blood pressures are currently maintained - Continue amlodipine  and Imdur  - Held ARB.  Resume when deemed medically appropriate  Diabetes mellitus type 2 with obesity BMI 32.69 kg/m.  Home insulin  regimen includes Lantus 43 units daily. - Hypoglycemic protocols - Reduced Lantus to 20 units daily in case of need to be n.p.o. - CBGs before every meal with sensitive SSI  Chronic kidney disease stage IV Creatinine noted to be 2.7->2.57.  Patient baseline GFR noted to be around 22 back in 02/2023 which is similar to today. - Continue to monitor - Avoid possible nephrotoxic agents  Coronary artery disease Hyperlipidemia Remote history of stent placement - Continue a Crestor , Imdur , and aspirin   Debility Patient ambulates with the use of a cane at baseline and reports increased dyspnea on exertion over the last couple of months. - PT to evaluate and treat  DVT prophylaxis: Heparin  Advance Care Planning:   Code Status: Full Code    Consults: Cardiology  Family Communication: Son updated over the phone  Severity of Illness: The appropriate patient status for this patient is INPATIENT. Inpatient status is judged to be reasonable and necessary in order to provide the required intensity of service to ensure the patient's safety. The patient's presenting symptoms, physical exam findings, and initial radiographic and laboratory data in the context of their chronic comorbidities is felt to place them at high risk for further clinical deterioration. Furthermore, it is not anticipated that the patient will be medically stable for discharge from the hospital within 2 midnights of admission.   * I certify that at the point of admission it is my clinical judgment that the patient will require inpatient hospital care spanning beyond 2 midnights from the point of admission due to high intensity of service, high risk for further deterioration and high frequency of surveillance  required.*  Author: Maximino DELENA Sharps, MD 05/26/2024 8:44 AM  For on call review [email or web address].

## 2024-05-26 NOTE — ED Notes (Signed)
 RN sent down light green and lavender top top lab

## 2024-05-26 NOTE — Progress Notes (Signed)
 [employer] representative was unable to find any patient by this name/birth date Reaching out to Dr. Christiane office to try and get the monitor data  In d/w office manager Patient is wearing an I-Rhythm monitor that is not live and not yet completed The patient is enrolled in a program [employer] that monitors BP and HR and there were readings of HR 40's reported via this monitor system, she read them off to me, seems all were in the 40's, and associated with normal BP, and unclear report of symptoms > team this system apparently advised he seek attention They wil fax this report over for our review  Charlies Arthur, PA-C

## 2024-05-26 NOTE — Progress Notes (Signed)
  Carryover admission to the Day Admitter.  I discussed this case with the EDP, Dr. Palumbo.  Per these discussions:  This is a 89 year old male with history of CKD stage IV, who is being admitted with symptomatic bradycardia with plan for pacemaker placement today.  Patient sent in by PCP for evaluation of concern regarding symptomatic bradycardia as well as hypotension as an outpatient, reported to be experiencing some shortness of breath at home.   In the ED this evening, heart rates been in the 50s, systolic blood pressures have been in the 140s to 170s, without any evidence of hypotension, he has been afebrile, with oxygen saturations in the range of 97 to 100% on room air.  EDP has discussed with on-call cardiology, Dr. Gail, who has consulted, and recommends hospitalist admission, with cardiology's plan for placement of pacemaker today.  I have placed an order for the patient admission to PCU for further evaluation and management of the above.  I have placed some additional preliminary admit orders via the adult multi-morbid admission order set. I have also ordered n.p.o. as well as morning labs that include CMP, CBC, magnesium level as well as INR.    Eva Pore, DO Hospitalist

## 2024-05-26 NOTE — ED Notes (Signed)
 Cardiology at bedside.

## 2024-05-26 NOTE — ED Notes (Signed)
 CCMD called for cardiac monitoring.

## 2024-05-26 NOTE — Consult Note (Signed)
 Cardiology Consultation   Patient ID: COLYN MIRON MRN: [id number]; DOB: 09/04/1930  Admit date: 05/25/2024 Date of Consult: 05/26/2024  PCP:  Rexanne Ingle, MD   [facility] Providers Cardiologist:  Victory LELON [name] [name], MD (Inactive) Dr. Wendel  Patient Profile: CHRITOPHER COSTER is a 89 y.o. male with a hx of  1 Coronary artery disease involving native coronary artery of native heart without angina pectoris  (reported remote stent)  2. Type 2 diabetes mellitus with complication, with long-term current use of insulin  (HCC)   3. CKD (chronic kidney disease) stage 4, GFR 15-29 ml/min (HCC)   4. Hypertension associated with diabetes (HCC)   5. Hyperlipidemia associated with type 2 diabetes mellitus (HCC)   6. Aortic atherosclerosis (HCC)   7. Chronic diastolic CHF (congestive heart failure) (HCC)      who is being seen 05/26/2024 for the evaluation of abnormal monitor at the request of D. Smith.  History of Present Illness: Mr. Lemelin last saw cardiology team Jan 2025 w/Dr. Wendel, doing well, no symptoms reported, some gait instability, stumbles, reported doesn't pick jhis feet up well when walking Planned to update his echo/murmur, some AV disease  He was sent to the ER seems 2/2 abnormal monitor findings Reported to cards consultant that in the last year reduced energy, more fatigue some reduction in exertional capacity, DOE. No reported awareness of HR drops  Advised IM admission with some anemia and EP consult in the AM   The patient reports about a year of feeling a bit off balance, tends to stumble, but denies falls. Denies feeling lightheaded, no near syncope or syncope reported. No CP, palpitations or cardiac awareness No rest SOB but observed to get winded moving around in the strtecher > he reports this has been progressive over the last couple years > a down trend in his exertional capacity More easily winded  Specifically he denies any kind of  unsual symptoms, dizziness,  yesterday  LABS K+ 5.0, 4.6 BUN/Creat 56/2.7 > 2.57 Mag 2.7 WBC 4.1 > 3.5 Hgb 10.8 > 9.4 Hct 29 Plts 78    Past Medical History:  Diagnosis Date   Anemia    Anginal pain    Arthritis    knees   Bronchitis 11/2011   first time for me   CAD (coronary artery disease)    With DES circumflex 02/2009   CHF (congestive heart failure) (HCC)    Coronary atherosclerosis of native coronary artery    Diabetes mellitus with kidney disease (HCC)    Diastolic heart failure (HCC)    Diverticulosis of colon (without mention of hemorrhage)    Dyspnea    Exertional dyspnea    Chronic   GERD (gastroesophageal reflux disease)    History of kidney stones    History of peptic ulcer disease    Remote history   Hypercholesterolemia    Hypertension    Kidney stone    dr told me my kidney was loaded w/stones; imbedded   Labyrinthitis    Leg edema    Meralgia paresthetica    Mixed hyperlipidemia    Osteoarthrosis, unspecified whether generalized or localized, lower leg    Palpitations    Prostate cancer (HCC)    s/p prostatectomy   Type II diabetes mellitus (HCC)    dx 3 yrs ago    Past Surgical History:  Procedure Laterality Date   BACK SURGERY     BIOPSY  04/09/2019   Procedure: BIOPSY;  Surgeon: Celestia Lynwood,  MD;  Location: [facility];  Service: Endoscopy;;   CARDIAC CATHETERIZATION     CATARACT EXTRACTION W/ INTRAOCULAR LENS IMPLANT & ANTERIOR VITRECTOMY, BILATERAL  1990's   COLONOSCOPY     COLONOSCOPY WITH PROPOFOL  N/A 04/09/2019   Procedure: COLONOSCOPY WITH PROPOFOL ;  Surgeon: Celestia Agent, MD;  Location: [facility];  Service: Endoscopy;  Laterality: N/A;   COLONOSCOPY WITH PROPOFOL  N/A 08/31/2019   Procedure: COLONOSCOPY WITH PROPOFOL ;  Surgeon: Elicia Claw, MD;  Location: [facility];  Service: Gastroenterology;  Laterality: N/A;   COLONOSCOPY WITH PROPOFOL  N/A 03/29/2020   Procedure: COLONOSCOPY WITH PROPOFOL ;  Surgeon:  Elicia Claw, MD;  Location: [facility];  Service: Gastroenterology;  Laterality: N/A;   CORONARY ANGIOPLASTY WITH STENT PLACEMENT  02/2009   Stent x 1 (DES circumflex)   DIAGNOSTIC LAPAROSCOPY     ESOPHAGOGASTRODUODENOSCOPY (EGD) WITH PROPOFOL  N/A 04/09/2019   Procedure: ESOPHAGOGASTRODUODENOSCOPY (EGD) WITH PROPOFOL ;  Surgeon: Celestia Agent, MD;  Location: [facility];  Service: Endoscopy;  Laterality: N/A;   EYE SURGERY     bil lens implants   HEMOSTASIS CLIP PLACEMENT  08/31/2019   Procedure: HEMOSTASIS CLIP PLACEMENT;  Surgeon: Elicia Claw, MD;  Location: [facility];  Service: Gastroenterology;;  Ascending Polyp    HEMOSTASIS CONTROL  03/29/2020   Procedure: HEMOSTASIS CONTROL;  Surgeon: Elicia Claw, MD;  Location: [facility];  Service: Gastroenterology;;   JOINT REPLACEMENT     s/p left knee   LASIK Bilateral    LEFT HEART CATH AND CORONARY ANGIOGRAPHY N/A 02/27/2018   Procedure: LEFT HEART CATH AND CORONARY ANGIOGRAPHY;  Surgeon: [name] Victory ORN, MD;  Location: [facility];  Service: Cardiovascular;  Laterality: N/A;   LUMBAR LAMINECTOMY/DECOMPRESSION MICRODISCECTOMY N/A 12/23/2014   Procedure:  L2-3 DECOMPRESSION;  Surgeon: Donaciano Sprang, MD;  Location: [facility];  Service: Orthopedics;  Laterality: N/A;   POLYPECTOMY  04/09/2019   Procedure: POLYPECTOMY;  Surgeon: Celestia Agent, MD;  Location: [facility];  Service: Endoscopy;;   POLYPECTOMY  08/31/2019   Procedure: POLYPECTOMY;  Surgeon: Elicia Claw, MD;  Location: [facility];  Service: Gastroenterology;;   POLYPECTOMY  03/29/2020   Procedure: POLYPECTOMY;  Surgeon: Elicia Claw, MD;  Location: [facility];  Service: Gastroenterology;;   PROSTATECTOMY  2009   Robotic prostatectomy    Skin Lumps Removed     TOTAL KNEE ARTHROPLASTY  12/31/2011   Procedure: TOTAL KNEE ARTHROPLASTY;  Surgeon: Lamar DELENA Millman, MD;  Location: [facility];  Service: Orthopedics;  Laterality: Left;  Left total knee  arthroplasty   TOTAL SHOULDER REPLACEMENT  1990's   bilaterally   TRIGGER FINGER RELEASE Left 02/14/2021   Procedure: RELEASE TRIGGER FINGER/A-1 PULLEY LEFT RING FINGER;  Surgeon: Murrell Kuba, MD;  Location: [facility];  Service: Orthopedics;  Laterality: Left;  45 MIN     Home Medications:  Prior to Admission medications   Medication Sig Start Date End Date Taking? Authorizing Provider  amLODipine  (NORVASC ) 10 MG tablet TAKE 1 TABLET DAILY 06/13/22   [name] Victory ORN, MD  Aromatic Inhalants (VICKS VAPOINHALER IN) Inhale 1 puff into the lungs at bedtime.    [provider]  aspirin  EC 81 MG tablet Take 1 tablet (81 mg total) by mouth daily. Swallow whole. 07/17/23   Thukkani, Arun K, MD  Continuous Blood Gluc Receiver (FREESTYLE LIBRE 14 DAY READER) DEVI SMARTSIG:1 Patch(s) Topical As Directed 08/19/20   [provider]  Continuous Blood Gluc Sensor (FREESTYLE LIBRE 14 DAY SENSOR) MISC Apply topically. 01/28/21   [provider]  docusate sodium  (COLACE) 250 MG capsule Take 250 mg by mouth at bedtime.    [provider]  empagliflozin (JARDIANCE) 10 MG TABS tablet Take 2 tablets by mouth daily.    [provider]  furosemide  (LASIX ) 40 MG tablet Take 40 mg by mouth 2 (two) times daily.     [provider]  insulin  glargine (LANTUS) 100 UNIT/ML Solostar Pen Inject 38 Units into the skin daily.    [provider]  isosorbide  mononitrate (IMDUR ) 30 MG 24 hr tablet Take 30 mg by mouth daily.    [provider]  nitroGLYCERIN  (NITROSTAT ) 0.4 MG SL tablet Place 0.4 mg under the tongue every 5 (five) minutes as needed for chest pain.     [provider]  ONE TOUCH ULTRA TEST test strip  07/21/18   [provider]  rosuvastatin  (CRESTOR ) 10 MG tablet Take 10 mg by mouth daily.    [provider]  telmisartan  (MICARDIS ) 80 MG tablet Take 1 tablet (80 mg total) by mouth daily. 03/25/18   Darlean Ozell NOVAK, MD  traMADol  (ULTRAM ) 50 MG tablet Take 50 mg by mouth 2 (two) times daily as needed for moderate pain (arthritis back pain.).     [provider]  vitamin B-12 (CYANOCOBALAMIN) 1000 MCG tablet Take 1,000 mcg by mouth at bedtime.    [provider]  Vitamin D3 (VITAMIN D) 25 MCG tablet Take 1,000 Units by mouth daily.    [provider]    Scheduled Meds:  Continuous Infusions:  PRN Meds: acetaminophen  **OR** acetaminophen , ondansetron  (ZOFRAN ) IV  Allergies:    Allergies  Allergen Reactions   Amoxicillin Hives, Rash and Other (See Comments)    sores in my mouth Did it involve swelling of the face/tongue/throat, SOB, or low BP? Yes Did it involve sudden or severe rash/hives, skin peeling, or any reaction on the inside of your mouth or nose? Yes Did you need to seek medical attention at a hospital or doctor's office? Unknown When did it last happen?at least 10 years ago If all above answers are "NO", may proceed with cephalosporin use.    Oxycodone  Hcl     agitation   Simvastatin Itching    Social History:   Social History   Socioeconomic History   Marital status: Widowed    Spouse name: Not on file   Number of children: 1   Years of education: Not on file   Highest education level: Not on file  Occupational History   Occupation: Retired  Tobacco Use   Smoking status: Former    Current packs/day: 0.00    Average packs/day: 0.5 packs/day for 15.0 years (7.5 ttl pk-yrs)    Types: Cigarettes    Start date: 07/09/1956    Quit date: 07/10/1971    Years since quitting: 52.9   Smokeless tobacco: Never  Vaping Use   Vaping status: Never Used  Substance and Sexual Activity   Alcohol use: Yes    Comment: 01/01/12 occassionall have a glass of beer or wine; not even once a week   Drug use: No   Sexual activity: Not Currently  Other Topics Concern   Not on file  Social History Narrative   Not on file   Social Drivers of Health   Financial  Resource Strain: Low Risk  (02/27/2018)   Overall Financial Resource Strain (CARDIA)    Difficulty of Paying Living Expenses: Not hard at all  Food Insecurity: No Food Insecurity (02/27/2018)  Hunger Vital Sign    Worried About Running Out of Food in the Last Year: Never true    Ran Out of Food in the Last Year: Never true  Transportation Needs: No Transportation Needs (02/27/2018)   PRAPARE - Administrator, Civil Service (Medical): No    Lack of Transportation (Non-Medical): No  Physical Activity: Inactive (02/27/2018)   Exercise Vital Sign    Days of Exercise per Week: 0 days    Minutes of Exercise per Session: 0 min  Stress: No Stress Concern Present (02/27/2018)   [employer] of Occupational [employer] - Occupational Stress Questionnaire    Feeling of Stress : Not at all  Social Connections: Not on file  Intimate Partner Violence: Not At Risk (02/27/2018)   Humiliation, Afraid, Rape, and Kick questionnaire    Fear of Current or Ex-Partner: No    Emotionally Abused: No    Physically Abused: No    Sexually Abused: No    Family History:   Family History  Problem Relation Age of Onset   CVA Father    CAD Father    Prostate cancer Father    Heart disease Father        ASHD   Stroke Father    Nephritis Brother    COPD Brother    CVA Mother    Tremor Mother    Dementia Mother    Heart attack Neg Hx      ROS:  Please see the history of present illness.  All other ROS reviewed and negative.     Physical Exam/Data: Vitals:   05/26/24 0700 05/26/24 0715 05/26/24 0727 05/26/24 0730  BP: (!) 132/48 129/79  (!) 146/43  Pulse: (!) 53 63  (!) 48  Resp: (!) 24 (!) 25  18  Temp:   (!) 97.5 F (36.4 C)   TempSrc:   Oral   SpO2: 98% 100%  98%  Weight:      Height:        Intake/Output Summary (Last 24 hours) at 05/26/2024 0903 Last data filed at 05/26/2024 0416 Gross per 24 hour  Intake --  Output 450 ml  Net -450 ml      05/25/2024    6:18 PM 07/17/2023    10:38 AM 05/03/2022   11:04 AM  Last 3 Weights  Weight (lbs) 215 lb 221 lb 9.6 oz 216 lb  Weight (kg) 97.523 kg 100.517 kg 97.977 kg     Body mass index is 32.69 kg/m.  General:  Well nourished, well developed, in no acute distress HEENT: normal Neck: no JVD Vascular: No carotid bruits; Distal pulses 2+ bilaterally Cardiac:  RRR; 1-2/6 SM Lungs:  CTA b/l, no wheezing, rhonchi or rales  Abd: soft, nontender Ext: 1-2+ edema b/l LE, chronic looking skin changes Musculoskeletal:  No deformities Skin: warm and dry  Neuro:  no focal abnormalities noted Psych:  Normal affect   EKG:  The EKG was personally reviewed and demonstrates:    Baseline motion, looks like SB w/PACs, 50bpm, LBBB - Personally Reviewed  07/17/23:  SR 70bpm, 1st dehgree AVblock, , LBBB  Telemetry:  Telemetry was personally reviewed and demonstrates:    SB 50's, frequent  PACs transient high 40's   Relevant CV Studies:  05/19/24: TTE 1. Some flow acceleration through LVOT but no SAM or valvular stenosis  Likely from basal septal hypertrophy. Left ventricular ejection fraction,  by estimation, is 60 to 65%. The left ventricle has normal function. The  left ventricle has no regional wall   motion abnormalities. There is moderate left ventricular hypertrophy.  Left ventricular diastolic parameters were normal. The average left  ventricular global longitudinal strain is -18.2 %. The global longitudinal  strain is normal.   2. Right ventricular systolic function is normal. The right ventricular  size is normal.   3. Left atrial size was moderately dilated.   4. The mitral valve is abnormal. Mild mitral valve regurgitation. No  evidence of mitral stenosis.   5. Tricuspid valve regurgitation is mild to moderate.   6. The aortic valve is tricuspid. There is moderate calcification of the  aortic valve. There is moderate thickening of the aortic valve. Aortic  valve regurgitation is not visualized. Aortic  valve  sclerosis/calcification is present, without any evidence  of aortic stenosis.   7. The inferior vena cava is normal in size with greater than 50%  respiratory variability, suggesting right atrial pressure of 3 mmHg.    Laboratory Data: High Sensitivity Troponin:  No results for input(s): TROPONINIHS in the last 720 hours.   Chemistry Recent Labs  Lab 05/25/24 1844 05/26/24 0505  NA 137 138  K 5.0 4.6  CL 105 107  CO2 23 23  GLUCOSE 244* 111*  BUN 56* 54*  CREATININE 2.70* 2.57*  CALCIUM  9.3 8.9  MG  --  2.7*  GFRNONAA 22* 23*  ANIONGAP 9 8    Recent Labs  Lab 05/25/24 1844 05/26/24 0505  PROT 6.8 6.2*  ALBUMIN 3.6 3.2*  AST 25 22  ALT 19 18  ALKPHOS 52 46  BILITOT 0.9 0.8   Lipids No results for input(s): CHOL, TRIG, HDL, LABVLDL, LDLCALC, CHOLHDL in the last 168 hours.  Hematology Recent Labs  Lab 05/25/24 1844 05/26/24 0505  WBC 4.1 3.5*  RBC 3.58* 3.16*  HGB 10.8* 9.4*  HCT 33.0* 29.1*  MCV 92.2 92.1  MCH 30.2 29.7  MCHC 32.7 32.3  RDW 14.2 14.2  PLT 83* 78*   Thyroid  No results for input(s): TSH, FREET4 in the last 168 hours.  BNPNo results for input(s): BNP, PROBNP in the last 168 hours.  DDimer No results for input(s): DDIMER in the last 168 hours.  Radiology/Studies:  DG Chest Portable 1 View Result Date: 05/26/2024 EXAM: 1 VIEW(S) XRAY OF THE CHEST 05/26/2024 01:28:00 AM COMPARISON: None available. CLINICAL HISTORY: painetc FINDINGS: LINES, TUBES AND DEVICES: Cardiac monitor overlying left chest. LUNGS AND PLEURA: Low lung volumes. No focal pulmonary opacity. No pleural effusion. No pneumothorax. HEART AND MEDIASTINUM: Atherosclerotic plaque. No acute abnormality of the cardiac and mediastinal silhouettes. BONES AND SOFT TISSUES: No acute osseous abnormality. IMPRESSION: 1. No acute findings. 2. Low lung volumes. Electronically signed by: Dorethia Molt MD 05/26/2024 01:42 AM EST RP Workstation: [id number]      Assessment and Plan:  Reports of abnormal Zio monitor Baseline conduction system disease  No clear symptoms of bradycardia No pauses, advanced heart block or significant bradycardia here I have reached out to our Zio rep to see if they can assist in getting his Zio tracings  3. DOE 4  diastolic HF by history Appears volume OL Dose IV lasix  ordered   Further as per attending service  5. Abnormal CBC Anemia, thrombocytopenia Deferred to IM service     For questions or updates, please contact [facility] Please consult [email or web address] for contact info under   Signed, Charlies Macario Arthur, PA-C  05/26/2024 9:03 AM;

## 2024-05-27 DIAGNOSIS — R001 Bradycardia, unspecified: Secondary | ICD-10-CM | POA: Diagnosis not present

## 2024-05-27 LAB — BASIC METABOLIC PANEL WITH GFR
Anion gap: 9 (ref 5–15)
BUN: 58 mg/dL — ABNORMAL HIGH (ref 8–23)
CO2: 25 mmol/L (ref 22–32)
Calcium: 9.6 mg/dL (ref 8.9–10.3)
Chloride: 104 mmol/L (ref 98–111)
Creatinine, Ser: 2.73 mg/dL — ABNORMAL HIGH (ref 0.61–1.24)
GFR, Estimated: 21 mL/min — ABNORMAL LOW (ref >60)
Glucose, Bld: 121 mg/dL — ABNORMAL HIGH (ref 70–99)
Potassium: 5.6 mmol/L — ABNORMAL HIGH (ref 3.5–5.1)
Sodium: 138 mmol/L (ref 135–145)

## 2024-05-27 LAB — URINALYSIS, ROUTINE W REFLEX MICROSCOPIC
Bacteria, UA: NONE SEEN
Bilirubin Urine: NEGATIVE
Glucose, UA: >=500 mg/dL — AB
Hgb urine dipstick: NEGATIVE
Ketones, ur: NEGATIVE mg/dL
Leukocytes,Ua: NEGATIVE
Nitrite: NEGATIVE
Protein, ur: 30 mg/dL — AB
Specific Gravity, Urine: 1.01 (ref 1.005–1.030)
pH: 6 (ref 5.0–8.0)

## 2024-05-27 LAB — GLUCOSE, CAPILLARY
Glucose-Capillary: 117 mg/dL — ABNORMAL HIGH (ref 70–99)
Glucose-Capillary: 159 mg/dL — ABNORMAL HIGH (ref 70–99)
Glucose-Capillary: 145 mg/dL — ABNORMAL HIGH (ref 70–99)
Glucose-Capillary: 160 mg/dL — ABNORMAL HIGH (ref 70–99)

## 2024-05-27 MED ORDER — FUROSEMIDE 10 MG/ML IJ SOLN
40.0000 mg | Freq: Two times a day (BID) | INTRAMUSCULAR | Status: DC
  Administered 2024-05-27: 40 mg via INTRAVENOUS
  Filled 2024-05-27: qty 4

## 2024-05-27 MED ORDER — IRBESARTAN 300 MG PO TABS
150.0000 mg | ORAL_TABLET | Freq: Every day | ORAL | Status: DC
  Administered 2024-05-27 – 2024-05-28 (×2): 150 mg via ORAL
  Filled 2024-05-27 (×2): qty 1

## 2024-05-27 MED ORDER — FUROSEMIDE 10 MG/ML IJ SOLN
40.0000 mg | Freq: Two times a day (BID) | INTRAMUSCULAR | Status: DC
  Administered 2024-05-27 – 2024-05-28 (×2): 40 mg via INTRAVENOUS
  Filled 2024-05-27 (×2): qty 4

## 2024-05-27 NOTE — Progress Notes (Signed)
 Rounding Note   Patient Name: Ryan Hunt Date of Encounter: 05/27/2024  [facility] Cardiologist: Ryan LELON [name] [name], MD (Inactive) Dr. Wendel  Subjective  No complaints, slept well  Scheduled Meds:  amLODipine   10 mg Oral Daily   aspirin  EC  81 mg Oral QHS   furosemide   40 mg Intravenous BID   insulin  aspart  0-15 Units Subcutaneous TID WC   insulin  aspart  0-5 Units Subcutaneous QHS   insulin  glargine-yfgn  20 Units Subcutaneous Q24H   isosorbide  mononitrate  30 mg Oral Daily   rosuvastatin   10 mg Oral Daily   Continuous Infusions:  PRN Meds: acetaminophen  **OR** acetaminophen , albuterol , ondansetron  (ZOFRAN ) IV, traMADol    Vital Signs  Vitals:   05/26/24 2020 05/27/24 0005 05/27/24 0335 05/27/24 0452  BP: (!) 137/45 (!) 155/44 (!) 120/40   Pulse: (!) 46 (!) 50 (!) 52   Resp: 18 18 20    Temp: 97.7 F (36.5 C) 97.6 F (36.4 C) (!) 97.5 F (36.4 C)   TempSrc: Oral Oral Oral   SpO2: 94% 96% 95%   Weight:    95.3 kg  Height:        Intake/Output Summary (Last 24 hours) at 05/27/2024 0817 Last data filed at 05/27/2024 0335 Gross per 24 hour  Intake 653 ml  Output 1825 ml  Net -1172 ml      05/27/2024    4:52 AM 05/25/2024    6:18 PM 07/17/2023   10:38 AM  Last 3 Weights  Weight (lbs) 210 lb 1.6 oz 215 lb 221 lb 9.6 oz  Weight (kg) 95.3 kg 97.523 kg 100.517 kg      Telemetry  SB 39-40's overnight, 40's-60s otherwise, intermittently frequent PACs - Personally Reviewed  ECG   No new EKGs - Personally Reviewed  Physical Exam  GEN: No acute distress.   Neck: No JVD Cardiac: RRR, no murmurs, rubs, or gallops.  Respiratory: CTA b/l. GI: Soft, nontender, non-distended  MS: 1-2+ edema remains b/l LE; chronic skin changes No deformity. Neuro:  Nonfocal  Psych: Normal affect   Labs High Sensitivity Troponin:  No results for input(s): TROPONINIHS in the last 720 hours.   Chemistry Recent Labs  Lab 05/25/24 1844 05/26/24 0505  05/27/24 0717  NA 137 138 138  K 5.0 4.6 5.6*  CL 105 107 104  CO2 23 23 25   GLUCOSE 244* 111* 121*  BUN 56* 54* 58*  CREATININE 2.70* 2.57* 2.73*  CALCIUM  9.3 8.9 9.6  MG  --  2.7*  --   PROT 6.8 6.2*  --   ALBUMIN 3.6 3.2*  --   AST 25 22  --   ALT 19 18  --   ALKPHOS 52 46  --   BILITOT 0.9 0.8  --   GFRNONAA 22* 23* 21*  ANIONGAP 9 8 9     Lipids No results for input(s): CHOL, TRIG, HDL, LABVLDL, LDLCALC, CHOLHDL in the last 168 hours.  Hematology Recent Labs  Lab 05/25/24 1844 05/26/24 0505  WBC 4.1 3.5*  RBC 3.58* 3.16*  HGB 10.8* 9.4*  HCT 33.0* 29.1*  MCV 92.2 92.1  MCH 30.2 29.7  MCHC 32.7 32.3  RDW 14.2 14.2  PLT 83* 78*   Thyroid   Recent Labs  Lab 05/26/24 1156  TSH 1.792    BNP Recent Labs  Lab 05/26/24 1156  BNP 133.8*    DDimer No results for input(s): DDIMER in the last 168 hours.   Radiology  DG Chest  Portable 1 View Result Date: 05/26/2024 EXAM: 1 VIEW(S) XRAY OF THE CHEST 05/26/2024 01:28:00 AM COMPARISON: None available. CLINICAL HISTORY: painetc FINDINGS: LINES, TUBES AND DEVICES: Cardiac monitor overlying left chest. LUNGS AND PLEURA: Low lung volumes. No focal pulmonary opacity. No pleural effusion. No pneumothorax. HEART AND MEDIASTINUM: Atherosclerotic plaque. No acute abnormality of the cardiac and mediastinal silhouettes. BONES AND SOFT TISSUES: No acute osseous abnormality. IMPRESSION: 1. No acute findings. 2. Low lung volumes. Electronically signed by: Ryan Molt MD 05/26/2024 01:42 AM EST RP Workstation: [id number]    Cardiac Studies  05/19/24: TTE 1. Some flow acceleration through LVOT but no SAM or valvular stenosis  Likely from basal septal hypertrophy. Left ventricular ejection fraction,  by estimation, is 60 to 65%. The left ventricle has normal function. The  left ventricle has no regional wall   motion abnormalities. There is moderate left ventricular hypertrophy.  Left ventricular diastolic parameters  were normal. The average left  ventricular global longitudinal strain is -18.2 %. The global longitudinal  strain is normal.   2. Right ventricular systolic function is normal. The right ventricular  size is normal.   3. Left atrial size was moderately dilated.   4. The mitral valve is abnormal. Mild mitral valve regurgitation. No  evidence of mitral stenosis.   5. Tricuspid valve regurgitation is mild to moderate.   6. The aortic valve is tricuspid. There is moderate calcification of the  aortic valve. There is moderate thickening of the aortic valve. Aortic  valve regurgitation is not visualized. Aortic valve  sclerosis/calcification is present, without any evidence  of aortic stenosis.   7. The inferior vena cava is normal in size with greater than 50%  respiratory variability, suggesting right atrial pressure of 3 mmHg.   Patient Profile   89 y.o. male w/PMHx of  1 Coronary artery disease involving native coronary artery of native heart without angina pectoris  (reported remote stent)  2. Type 2 diabetes mellitus with complication, with long-term current use of insulin  (HCC)   3. CKD (chronic kidney disease) stage 4, GFR 15-29 ml/min (HCC)   4. Hypertension associated with diabetes (HCC)   5. Hyperlipidemia associated with type 2 diabetes mellitus (HCC)   6. Aortic atherosclerosis (HCC)   7. Chronic diastolic CHF (congestive heart failure) (HCC)    Referred to the ER for abnormal monitoring/reports of bradycardia  Assessment & Plan   Bradycardia He is wearing a non-live I-Rhythm monitor Appreciate Ryan Hunt office assistance He is enrolled with a service called [employer] > what I understand is a continuous BP monitor system that has HR data as well In review of the report noting HR 40's (day time) with stable/normotensive BPs Some vague c/o reported feeling as if he had a heavy object on his head/shoulder not new for him > advised to the ER  HRs nocturnal 39-40's,  daytime 40's-60's Intermittently has frequent PACs Reports years of some degree of gait instability, no falls No near syncope or syncope No indication for PPM at this time Dr. Cindie d/w the patient I have discussed with the patient's son Ryan Hunt over the phone   Acute/chronic CHF (diastolic) DOE Volume OL Urine output yesterday 1,912ml (no intake charted) Remains with LE edema Ordered BID IV lasix  today Follow BMET K+ should improve with more diuresis Further with IM service  EP team will sign off though remain available Will defer ongoing volume/diuresis to attending team As well as management of his abn CBC, other clinical issues. Please  recall if needed   For questions or updates, please contact [facility] Please consult [email or web address] for contact info under   Charlies Macario Arthur, PA-C  05/27/2024, 8:17 AM

## 2024-05-27 NOTE — Plan of Care (Signed)
  Problem: Clinical Measurements: Goal: Ability to maintain clinical measurements within normal limits will improve Outcome: Progressing   Problem: Nutrition: Goal: Adequate nutrition will be maintained Outcome: Progressing   Problem: Metabolic: Goal: Ability to maintain appropriate glucose levels will improve Outcome: Progressing

## 2024-05-27 NOTE — Progress Notes (Signed)
 Ryan Hunt  FMW:[id number] DOB: 1930-10-06 DOA: 05/25/2024 PCP: Rexanne Ingle, MD    Brief Narrative:  89 year old with a history of HTN, HLD, CAD, chronic diastolic CHF, DM2, and CKD stage IV who presented to the ER 11/17 with complaints of dizziness and lightheadedness primarily upon standing which had been persistent for many days.  He had been undergoing an outpatient workup to include a Holter monitor which had reportedly recorded significant drops in the patient's heart rate 10-11 times per day the previous day.  In the ER he was found to have heart rates as low as 48.  CXR was without acute findings.  Goals of Care:   Code Status: Full Code   DVT prophylaxis: SCDs Start: 05/26/24 0442   Interim Hx: Afebrile since admission.  Sinus bradycardia persists with heart rates 40-59 but with preserved blood pressure.  Resting comfortably in bed.  Feeling much better overall with diuresis.  Denies current chest pain heaviness or shortness of breath.  Assessment & Plan:  Sinus bradycardia Not on any offending pharmacologic agents at present - EP has evaluated and does not presently feel that a pacemaker is indicated -TSH normal  Acute exacerbation of chronic diastolic congestive heart failure ? related to significant bradycardia - Cardiology feels this is due to diastolic CHF - 2+ bilateral lower extremity edema at presentation - TTE 05/19/24 confirmed EF 60-65% -symptomatically improving with diuresis  Hyperkalemia Follow potassium with ongoing increased use of diuretic  Pancytopenia Appears to be a chronic issue - follow  HTN Blood pressure presently stable - ARB on hold   DM2 Utilizes Lantus  insulin  43 units daily at home - CBG well-controlled  CKD stage IV Baseline GFR approximately 22 - renal function presently stable  CAD Remote history of stent placement -presently asymptomatic -continue Imdur  and aspirin   HLD Continue Crestor    Family Communication: No family  present at time of exam Disposition: Anticipate discharge home, possibly as early as 05/28/2024   Objective: Blood pressure (!) 120/40, pulse (!) 52, temperature (!) 97.5 F (36.4 C), temperature source Oral, resp. rate 20, height 5' 8 (1.727 m), weight 95.3 kg, SpO2 95%.  Intake/Output Summary (Last 24 hours) at 05/27/2024 0849 Last data filed at 05/27/2024 0335 Gross per 24 hour  Intake 653 ml  Output 1825 ml  Net -1172 ml   Filed Weights   05/25/24 1818 05/27/24 0452  Weight: 97.5 kg 95.3 kg    Examination: General: No acute respiratory distress Lungs: Clear to auscultation bilaterally without wheezes or crackles Cardiovascular: Regular rate and rhythm without murmur gallop or rub normal S1 and S2 Abdomen: Nontender, nondistended, soft, bowel sounds positive, no rebound, no ascites, no appreciable mass Extremities: No significant cyanosis, clubbing, or edema bilateral lower extremities  CBC: Recent Labs  Lab 05/25/24 1844 05/26/24 0505  WBC 4.1 3.5*  NEUTROABS 2.1 1.8  HGB 10.8* 9.4*  HCT 33.0* 29.1*  MCV 92.2 92.1  PLT 83* 78*   Basic Metabolic Panel: Recent Labs  Lab 05/25/24 1844 05/26/24 0505 05/27/24 0717  NA 137 138 138  K 5.0 4.6 5.6*  CL 105 107 104  CO2 23 23 25   GLUCOSE 244* 111* 121*  BUN 56* 54* 58*  CREATININE 2.70* 2.57* 2.73*  CALCIUM  9.3 8.9 9.6  MG  --  2.7*  --    GFR: Estimated Creatinine Clearance: 19.7 mL/min (A) (by C-G formula based on SCr of 2.73 mg/dL (H)).   Scheduled Meds:  amLODipine   10 mg Oral Daily  aspirin  EC  81 mg Oral QHS   furosemide   40 mg Intravenous BID   insulin  aspart  0-15 Units Subcutaneous TID WC   insulin  aspart  0-5 Units Subcutaneous QHS   insulin  glargine-yfgn  20 Units Subcutaneous Q24H   isosorbide  mononitrate  30 mg Oral Daily   rosuvastatin   10 mg Oral Daily      LOS: 1 day   Ryan IVAR Moores, MD [facility] Office  [phone] Pager - Text Page per Tracey  If 7PM-7AM,  please contact night-coverage per Amion 05/27/2024, 8:49 AM

## 2024-05-27 NOTE — Evaluation (Signed)
 Physical Therapy Brief Evaluation and Discharge Note Patient Details Name: Ryan Hunt MRN: [id number] DOB: 08/18/1930 Today's Date: 05/27/2024   History of Present Illness  Pt is 89 year old presented to [facility] on  05/25/24 for bradycardia. PMH - htn, CAD, chf, DM, ckd.  Clinical Impression  Pt doing well with mobility and no further PT needed.  Ready for dc from PT standpoint.        PT Assessment Patient does not need any further PT services  Assistance Needed at Discharge  PRN    Equipment Recommendations None recommended by PT  Recommendations for Other Services       Precautions/Restrictions Precautions Precautions: Fall Recall of Precautions/Restrictions: Intact Restrictions Weight Bearing Restrictions Per Provider Order: No        Mobility  Bed Mobility       General bed mobility comments: Sitting EOB  Transfers Overall transfer level: Modified independent Equipment used: None               General transfer comment: able to rise without use of hands    Ambulation/Gait Ambulation/Gait assistance: Modified independent (Device/Increase time) Gait Distance (Feet): 200 Feet Assistive device: Straight cane Gait Pattern/deviations: Step-through pattern, Wide base of support Gait Speed: P[facility]    Home Activity Instructions    Stairs            Modified Rankin (Stroke Patients Only)        Balance Overall balance assessment: Mild deficits observed, not formally tested                        Pertinent Vitals/Pain PT - Brief Vital Signs All Vital Signs Stable: Yes (HR 50-60's) Pain Assessment Pain Assessment: No/denies pain     Home Living Family/patient expects to be discharged to:: Private residence Living Arrangements: Alone Available Help at Discharge: Family;Available PRN/intermittently Home Environment: Level entry;Stairs in home  Stairs-Number of Steps: 13 to the basement. He uses ~1 month Home Equipment:  Cane - single point        Prior Function Level of Independence: Independent with assistive device(s) Comments: Uses cane at times    UE/LE Assessment   UE ROM/Strength/Tone/Coordination: [facility]    LE ROM/Strength/Tone/Coordination: [facility]      Communication   Communication Communication: Impaired Factors Affecting Communication: Hearing impaired     Cognition Overall Cognitive Status: Appears within functional limits for tasks assessed/performed       General Comments      Exercises     Assessment/Plan    PT Problem List         PT Visit Diagnosis Other abnormalities of gait and mobility (R26.89)    No Skilled PT Patient is modified independent with all activity/mobility;Patient at baseline level of functioning   Co-evaluation                AMPAC 6 Clicks Help needed turning from your back to your side while in a flat bed without using bedrails?: None Help needed moving from lying on your back to sitting on the side of a flat bed without using bedrails?: None Help needed moving to and from a bed to a chair (including a wheelchair)?: None Help needed standing up from a chair using your arms (e.g., wheelchair or bedside chair)?: None Help needed to walk in hospital room?: None Help needed climbing 3-5 steps with a railing? : None 6 Click Score: 24      End of Session  Activity Tolerance: Patient tolerated treatment well Patient left: in bed;with call bell/phone within reach (sitting EOB) Nurse Communication: Mobility status PT Visit Diagnosis: Other abnormalities of gait and mobility (R26.89)     Time: 8692-8672 PT Time Calculation (min) (ACUTE ONLY): 20 min  Charges:   PT Evaluation $PT Eval Low Complexity: 1 Low      [facility] PT Acute Rehabilitation Services Office [phone]   Rodgers ORN [facility]  05/27/2024, 1:55 PM

## 2024-05-28 ENCOUNTER — Other Ambulatory Visit: Payer: Self-pay

## 2024-05-28 ENCOUNTER — Other Ambulatory Visit (HOSPITAL_COMMUNITY): Payer: Self-pay

## 2024-05-28 DIAGNOSIS — R001 Bradycardia, unspecified: Secondary | ICD-10-CM | POA: Diagnosis not present

## 2024-05-28 LAB — BASIC METABOLIC PANEL WITH GFR
Anion gap: 13 (ref 5–15)
BUN: 63 mg/dL — ABNORMAL HIGH (ref 8–23)
CO2: 22 mmol/L (ref 22–32)
Calcium: 9.1 mg/dL (ref 8.9–10.3)
Chloride: 103 mmol/L (ref 98–111)
Creatinine, Ser: 2.66 mg/dL — ABNORMAL HIGH (ref 0.61–1.24)
GFR, Estimated: 22 mL/min — ABNORMAL LOW (ref >60)
Glucose, Bld: 118 mg/dL — ABNORMAL HIGH (ref 70–99)
Potassium: 4.8 mmol/L (ref 3.5–5.1)
Sodium: 138 mmol/L (ref 135–145)

## 2024-05-28 LAB — CBC
HCT: 30.4 % — ABNORMAL LOW (ref 39.0–52.0)
Hemoglobin: 9.9 g/dL — ABNORMAL LOW (ref 13.0–17.0)
MCH: 29.6 pg (ref 26.0–34.0)
MCHC: 32.6 g/dL (ref 30.0–36.0)
MCV: 90.7 fL (ref 80.0–100.0)
Platelets: 82 K/uL — ABNORMAL LOW (ref 150–400)
RBC: 3.35 MIL/uL — ABNORMAL LOW (ref 4.22–5.81)
RDW: 13.8 % (ref 11.5–15.5)
WBC: 3.9 K/uL — ABNORMAL LOW (ref 4.0–10.5)
nRBC: 0 % (ref 0.0–0.2)

## 2024-05-28 LAB — MAGNESIUM: Magnesium: 2.9 mg/dL — ABNORMAL HIGH (ref 1.7–2.4)

## 2024-05-28 LAB — GLUCOSE, CAPILLARY
Glucose-Capillary: 111 mg/dL — ABNORMAL HIGH (ref 70–99)
Glucose-Capillary: 299 mg/dL — ABNORMAL HIGH (ref 70–99)

## 2024-05-28 MED ORDER — FUROSEMIDE 40 MG PO TABS
40.0000 mg | ORAL_TABLET | Freq: Two times a day (BID) | ORAL | 0 refills | Status: AC
  Filled 2024-05-28: qty 180, 90d supply, fill #0

## 2024-05-28 NOTE — Progress Notes (Signed)
 SATURATION QUALIFICATIONS: (This note is used to comply with regulatory documentation for home oxygen)  Patient Saturations on Room Air at Rest = 98%  Patient Saturations on Room Air while Ambulating = 94-96%  Patient Saturations on N/A Liters of oxygen while Ambulating = N/A%  Please briefly explain why patient needs home oxygen: N/A. Does not qualify.

## 2024-05-28 NOTE — Progress Notes (Signed)
 Discharge instructions given to patient and son. Bother verbalized understanding and all questions were answered.

## 2024-05-28 NOTE — TOC CM/SW Note (Signed)
 Transition of Care [facility]) - Inpatient Brief Assessment   Patient Details  Name: Ryan Hunt MRN: [id number] Date of Birth: 1930/10/04  [facility]) CM/SW Contact:    Waddell Barnie Rama, RN Phone Number: 05/28/2024, 11:35 AM   Clinical Narrative: From home alone, has PCP and insurance on file, states has no HH services in place at this time , has cane  at home.  States family member  (son) will transport them home at [employer] and family is support system, states gets medications from [employer].  Pta self ambulatory with cane.  Patient would like to have [facility] for disease case management.  He does not have a preference of the agency.  NCM sent referral to [facility]. They have accepted referral.  Soc will begin 24 to 48 hrs post dc.    Transition of Care Asessment: Insurance and Status: Insurance coverage has been reviewed Patient has primary care physician: Yes Home environment has been reviewed: home alone Prior level of function:: ambulatory with cane Prior/Current Home Services: Current home services (cane) Social Drivers of Health Review: SDOH reviewed no interventions necessary Readmission risk has been reviewed: Yes Transition of care needs: transition of care needs identified, TOC will continue to follow

## 2024-05-28 NOTE — Discharge Summary (Signed)
 DISCHARGE SUMMARY  JAYDRIEN WASSENAAR  MR#: [id number]  DOB:December 13, 1930  Date of Admission: 05/25/2024 Date of Discharge: 05/28/2024  Attending Physician:Venus Ruhe ONEIDA Moores, MD  Patient's ERE:Enopuz, Tanda, MD  Disposition: D/C home   Follow-up Appts:  Follow-up Information     Rexanne Tanda, MD Follow up in 7 day(s).   Specialty: Internal Medicine Contact information: [address] [place] [address] [phone]                 Tests Needing Follow-up: -recheck Mg level -assess volume status - assure pt is compliant w/ BID lasix  dosing  -monitor pancytopenia   Discharge Diagnoses: Sinus bradycardia Hypermagnesemia Acute exacerbation of chronic diastolic congestive heart failure CKD stage IV Hyperkalemia Pancytopenia HTN DM2 CAD HLD  Initial presentation: 89 year old with a history of HTN, HLD, CAD, chronic diastolic CHF, DM2, and CKD stage IV who presented to the ER 11/17 with complaints of dizziness and lightheadedness primarily upon standing which had been persistent for many days. He had been undergoing an outpatient workup to include a Holter monitor which had reportedly recorded significant drops in the patient's heart rate 10-11 times per day the previous day. In the ER he was found to have heart rates as low as 48. CXR was without acute findings.   Hospital Course:  Sinus bradycardia Not on any offending pharmacologic agents at present - BP preserved - EP has evaluated and does not feel that a pacemaker is indicated - TSH normal   Hypermagnesemia Magnesium has been elevated during this hospital stay with a range of 2.7-2.9 - this is likely due to CKD/significant depression of GFR - this could conceivably be contributing to bradycardia - the patient has been advised to avoid magnesium containing supplements and over-the-counter medications - magnesium levels will need to be monitored intermittently as an outpt - continue use of  loop diuretic to increase magnesium excretion -certainly not severe enough at present to indicate dialysis/CRRT   Acute exacerbation of chronic diastolic congestive heart failure 2+ bilateral lower extremity edema at presentation - ? related to significant bradycardia - Cardiology feels this is due to diastolic CHF - TTE 05/19/24 noted EF 60-65% - symptomatically improved with diuresis - of note pt was prescribed lasix  BID as outpt but was only taking it every day - he has been counseled to resume BID dosing    CKD stage IV Baseline GFR approximately 22 - renal function presently stable   Hyperkalemia Improved with use of furosemide    Pancytopenia Appears to be a chronic issue - follow as outpatient   HTN Blood pressure presently stable   DM2 Utilizes Lantus  insulin  43 units daily at home - CBG well-controlled as inpatient   CAD Remote history of circumflex stent placement - presently asymptomatic -continue Imdur  and aspirin    HLD Continue Crestor   Allergies as of 05/28/2024       Reactions   Amoxicillin Hives, Rash, Other (See Comments)   sores in my mouth Did it involve swelling of the face/tongue/throat, SOB, or low BP? Yes Did it involve sudden or severe rash/hives, skin peeling, or any reaction on the inside of your mouth or nose? Yes Did you need to seek medical attention at a hospital or doctor's office? Unknown When did it last happen?at least 10 years ago If all above answers are "NO", may proceed with cephalosporin use.   Oxycodone  Hcl Other (See Comments)   agitation   Simvastatin Itching  Medication List     STOP taking these medications    amLODipine  10 MG tablet Commonly known as: NORVASC        TAKE these medications    aspirin  EC 81 MG tablet Take 1 tablet (81 mg total) by mouth daily. Swallow whole. What changed: when to take this   FreeStyle Libre 14 Day Sensor Misc Inject 1 Device into the skin every 14 (fourteen) days.    furosemide  40 MG tablet Commonly known as: LASIX  Take 1 tablet (40 mg total) by mouth 2 (two) times daily. What changed: when to take this   insulin  glargine 100 UNIT/ML Solostar Pen Commonly known as: LANTUS Inject 43 Units into the skin daily.   isosorbide  mononitrate 30 MG 24 hr tablet Commonly known as: IMDUR  Take 30 mg by mouth daily.   nitroGLYCERIN  0.4 MG SL tablet Commonly known as: NITROSTAT  Place 0.4 mg under the tongue every 5 (five) minutes as needed for chest pain.   rosuvastatin  10 MG tablet Commonly known as: CRESTOR  Take 10 mg by mouth daily.   telmisartan  80 MG tablet Commonly known as: Micardis  Take 1 tablet (80 mg total) by mouth daily.   traMADol  50 MG tablet Commonly known as: ULTRAM  Take 50 mg by mouth 2 (two) times daily as needed for moderate pain (arthritis back pain.).        Day of Discharge BP (!) 116/53 (BP Location: Left Arm)   Pulse 79   Temp 97.6 F (36.4 C) (Oral)   Resp 18   Ht 5' 8 (1.727 m)   Wt 95.5 kg   SpO2 99%   BMI 32.01 kg/m   Physical Exam: General: No acute respiratory distress Lungs: Clear to auscultation bilaterally without wheezes or crackles Cardiovascular: Regular rate and rhythm without murmur gallop or rub normal S1 and S2 Abdomen: Nontender, nondistended, soft, bowel sounds positive, no rebound, no ascites, no appreciable mass Extremities: No significant cyanosis, clubbing, or edema bilateral lower extremities  Basic Metabolic Panel: Recent Labs  Lab 05/25/24 1844 05/26/24 0505 05/27/24 0717 05/28/24 0301  NA 137 138 138 138  K 5.0 4.6 5.6* 4.8  CL 105 107 104 103  CO2 23 23 25 22   GLUCOSE 244* 111* 121* 118*  BUN 56* 54* 58* 63*  CREATININE 2.70* 2.57* 2.73* 2.66*  CALCIUM  9.3 8.9 9.6 9.1  MG  --  2.7*  --  2.9*    CBC: Recent Labs  Lab 05/25/24 1844 05/26/24 0505 05/28/24 0301  WBC 4.1 3.5* 3.9*  NEUTROABS 2.1 1.8  --   HGB 10.8* 9.4* 9.9*  HCT 33.0* 29.1* 30.4*  MCV 92.2 92.1 90.7   PLT 83* 78* 82*    Time spent in discharge (includes decision making & examination of pt): 35 minutes  05/28/2024, 11:17 AM   Reyes IVAR Moores, MD [facility] Office  [phone]

## 2024-05-28 NOTE — TOC Transition Note (Signed)
 Transition of Care [facility]) - Discharge Note   Patient Details  Name: MAAZ SPIERING MRN: [id number] Date of Birth: 1931/02/18  Transition [facility]) CM/SW Contact:  Waddell Barnie Rama, RN Phone Number: 05/28/2024, 11:37 AM   Clinical Narrative:    For dc today, his son will transport him home.  He is set up with [facility] for [facility].           Patient Goals and CMS Choice            Discharge Placement                       Discharge Plan and Services Additional resources added to the After Visit Summary for                                       Social Drivers of Health (SDOH) Interventions SDOH Screenings   Food Insecurity: No Food Insecurity (05/26/2024)  Housing: Low Risk  (05/26/2024)  Transportation Needs: No Transportation Needs (05/26/2024)  Utilities: Not At Risk (05/26/2024)  Financial Resource Strain: Low Risk  (02/27/2018)  Physical Activity: Inactive (02/27/2018)  Social Connections: Unknown (05/26/2024)  Stress: No Stress Concern Present (02/27/2018)  Tobacco Use: Medium Risk (05/25/2024)     Readmission Risk Interventions    05/28/2024   11:29 AM  Readmission Risk Prevention Plan  Post Dischage Appt Complete  Medication Screening Complete  Transportation Screening Complete

## 2024-05-28 NOTE — Evaluation (Signed)
 Occupational Therapy Evaluation and Discharge Patient Details Name: Ryan Hunt MRN: [id number] DOB: 29-Nov-1930 Today's Date: 05/28/2024   History of Present Illness   Pt is 89 year old presented to [facility] on  05/25/24 for bradycardia. PMH - htn, CAD, chf, DM, ckd.     Clinical Impressions Pt is functioning modified independently in self care and ambulation with his cane. He endorses having exertional shortness of breath at times at home and sits to rest. Pt has and uses a shower seat. Educated in the 5 Ps of energy conservation with pt verbalizing understanding. Pt is not interested in a medical alert system. He states,I am already on borrowed time. No further OT needs.      If plan is discharge home, recommend the following:         Functional Status Assessment   Patient has not had a recent decline in their functional status     Equipment Recommendations   None recommended by OT     Recommendations for Other Services         Precautions/Restrictions   Precautions Precautions: Fall Recall of Precautions/Restrictions: Intact Restrictions Weight Bearing Restrictions Per Provider Order: No     Mobility Bed Mobility               General bed mobility comments: sitting EOB at start and end of session    Transfers Overall transfer level: Modified independent Equipment used: Straight cane                      Balance Overall balance assessment: Needs assistance   Sitting balance-Leahy Scale: Normal     Standing balance support: Single extremity supported Standing balance-Leahy Scale: Good                             ADL either performed or assessed with clinical judgement   ADL Overall ADL's : Modified independent                                             Vision Baseline Vision/History: 1 Wears glasses Ability to See in Adequate Light: 0 Adequate Patient Visual Report: No change from  baseline       Perception         Praxis         Pertinent Vitals/Pain Pain Assessment Pain Assessment: No/denies pain     Extremity/Trunk Assessment Upper Extremity Assessment Upper Extremity Assessment: Overall WFL for tasks assessed   Lower Extremity Assessment Lower Extremity Assessment: Defer to PT evaluation   Cervical / Trunk Assessment Cervical / Trunk Assessment: Normal   Communication Communication Communication: Impaired Factors Affecting Communication: Hearing impaired   Cognition Arousal: Alert Behavior During Therapy: WFL for tasks assessed/performed               OT - Cognition Comments: pt reports some short term memory deficits                 Following commands: Intact       Cueing  General Comments   Cueing Techniques: Verbal cues      Exercises     Shoulder Instructions      Home Living Family/patient expects to be discharged to:: Private residence Living Arrangements: Alone Available Help at Discharge: Family;Available PRN/intermittently Type of Home: House  Bathroom Shower/Tub: Producer, Television/film/video: Standard     Home Equipment: Cane - single point;Wheelchair - manual;Grab bars - tub/shower;Shower seat;Hand held shower head          Prior Functioning/Environment Prior Level of Function : Independent/Modified Independent;Driving             Mobility Comments: walks with hurry cane ADLs Comments: sits to shower, endorses getting short winded and then he stops and rests    OT Problem List:     OT Treatment/Interventions:        OT Goals(Current goals can be found in the care plan section)       OT Frequency:       Co-evaluation              AM-PAC OT 6 Clicks Daily Activity     Outcome Measure Help from another person eating meals?: None Help from another person taking care of personal grooming?: None Help from another person toileting, which includes using  toliet, bedpan, or urinal?: None Help from another person bathing (including washing, rinsing, drying)?: None Help from another person to put on and taking off regular upper body clothing?: None Help from another person to put on and taking off regular lower body clothing?: None 6 Click Score: 24   End of Session Equipment Utilized During Treatment: Gait belt;Other (comment) (cane)  Activity Tolerance: Patient tolerated treatment well Patient left: in bed;with call bell/phone within reach;with nursing/sitter in room  OT Visit Diagnosis: Other (comment) (decreased activity tolerance)                Time: 9165-9147 OT Time Calculation (min): 18 min Charges:  OT General Charges $OT Visit: 1 Visit OT Evaluation $OT Eval Low Complexity: 1 Low  Ryan Hunt, OTR/L Acute Rehabilitation Services Office: [phone]   Ryan Hunt 05/28/2024, 9:38 AM

## 2024-06-19 ENCOUNTER — Ambulatory Visit: Attending: Cardiology | Admitting: Cardiology

## 2024-06-19 NOTE — Progress Notes (Deleted)
°  Cardiology Office Note:  .   Date:  06/19/2024  ID:  Ryan Hunt, DOB June 24, 1931, MRN [id number] PCP: Rexanne Ingle, MD  [facility] Providers Cardiologist:  Victory LELON [name] [name], MD (Inactive) {  History of Present Illness: .   Ryan Hunt is a 89 y.o. male with history of CAD with PCI to LCx 2010, chronic HFpEF, type 2 diabetes, hypertension, hyperlipidemia, CKD stage IV, IVCD     CAD 2010 PCI LCx 02/2018 LHC mild ISR in LCx, mild LAD disease, mild diagonal disease.  EF has been normal.  Bradycardia 04/2020 heart monitor sinus and sinus bradycardia without AV grade block or heart rate less than 48.  Occasional WCT salvos up to 8 beats.  No symptoms reported. 05/2024 admission.  PCP sent for abnormal heart monitor results.  Nocturnally heart rate around 40s-60s, EP evaluated with no indications for PPM with no symptoms.  TSH normal.  Did require some IV diuresis.   Social history      Patient with history of CAD remotely in 2010 with PCI to his LCx with only mild ISR on LHC in 2019.  He has known sinus bradycardia without any concerning features.  Recently admitted 05/2024 for abnormal heart monitor, heart rate was between 40-60 without any concerning arrhythmias.  EP evaluated and did not feel there was any indication for PPM.  Sinus bradycardia  CAD Hyperlipidemia -2010 PCI LCx -02/2018 LHC mild ISR in LCx, mild LAD disease, mild diagonal disease.  EF has been normal.  Chronic HFpEF  Type 2 diabetes  CKD   Hypertension  ROS: Denies: Chest pain, shortness of breath, orthopnea, peripheral edema, palpitations, decreased exercise intolerance, fatigue, lightheadedness.   Studies Reviewed: .         Risk Assessment/Calculations:   {Does this patient have ATRIAL FIBRILLATION?:[phone]} No BP recorded.  {Refresh Note OR Click here to enter BP  :1}***       Physical Exam:   VS:  There were no vitals taken for this visit.   Wt Readings from Last 3  Encounters:  05/28/24 210 lb 8.6 oz (95.5 kg)  07/17/23 221 lb 9.6 oz (100.5 kg)  05/03/22 216 lb (98 kg)    GEN: Well nourished, well developed in no acute distress NECK: No JVD; No carotid bruits CARDIAC: ***RRR, no murmurs, rubs, gallops RESPIRATORY:  Clear to auscultation without rales, wheezing or rhonchi  ABDOMEN: Soft, non-tender, non-distended EXTREMITIES:  No edema; No deformity   ASSESSMENT AND PLAN: .         {Are you ordering a CV Procedure (e.g. stress test, cath, DCCV, TEE, etc)?   Press F2        :[id number]}  Dispo: ***  Signed, Thom LITTIE Sluder, PA-C

## 2024-07-13 ENCOUNTER — Telehealth: Payer: Self-pay

## 2024-07-13 NOTE — Telephone Encounter (Signed)
-----   Message from Lurena Red, MD sent at 07/08/2024  3:03 PM EST ----- Regarding: Monitor results I reviewed monitor results transmitted from PCP.  Patient had been seen by EP inpatient with asymptomatic bradycardia.  I tried calling patient without answer today.  Please reach out to patient and arrange for f/u with APP or me.

## 2024-07-13 NOTE — Telephone Encounter (Signed)
 Attempted to call pt on cell phone and home phone numbers. Unable to reach anyone. Attempted to call work number, number states to not be in service. DPR from 2019 has no one listed on it for our office to be able to discuss anything with. Unable to LVM on any of the numbers for the pt. Will send a message to scheduling to try and reach pt for f/u appt.

## 2024-07-23 ENCOUNTER — Encounter: Payer: Self-pay | Admitting: Emergency Medicine

## 2024-07-23 ENCOUNTER — Ambulatory Visit: Attending: Emergency Medicine | Admitting: Emergency Medicine

## 2024-07-23 ENCOUNTER — Ambulatory Visit

## 2024-07-23 VITALS — BP 120/50 | HR 53 | Ht 68.0 in | Wt 216.0 lb

## 2024-07-23 DIAGNOSIS — R001 Bradycardia, unspecified: Secondary | ICD-10-CM | POA: Diagnosis not present

## 2024-07-23 DIAGNOSIS — I493 Ventricular premature depolarization: Secondary | ICD-10-CM

## 2024-07-23 DIAGNOSIS — R0609 Other forms of dyspnea: Secondary | ICD-10-CM

## 2024-07-23 DIAGNOSIS — D631 Anemia in chronic kidney disease: Secondary | ICD-10-CM | POA: Diagnosis not present

## 2024-07-23 DIAGNOSIS — I2510 Atherosclerotic heart disease of native coronary artery without angina pectoris: Secondary | ICD-10-CM | POA: Diagnosis not present

## 2024-07-23 DIAGNOSIS — R011 Cardiac murmur, unspecified: Secondary | ICD-10-CM

## 2024-07-23 DIAGNOSIS — I447 Left bundle-branch block, unspecified: Secondary | ICD-10-CM | POA: Diagnosis not present

## 2024-07-23 DIAGNOSIS — N184 Chronic kidney disease, stage 4 (severe): Secondary | ICD-10-CM | POA: Diagnosis not present

## 2024-07-23 NOTE — Assessment & Plan Note (Signed)
 Per patient's home vitals report that were scanned into the chart today, patient averages heart rates in the 40s throughout the day EP consulted during his hospital admission, not believed to need a pacemaker at that time EKG today: 53 bpm, sinus bradycardia with first-degree AV block with blocked PACs and occasional PVCs, LBBB, no significant change from prior studies. LBBB observed in EKGs as far back as 2018, possibly earlier.  PVCs appear to be occurring more frequently and today's EKG than prior EKGs Will order 2-week ZIO monitor to assess the extent of bradycardia and ectopy burden

## 2024-07-23 NOTE — Assessment & Plan Note (Signed)
 LHC 2019 with luminal irregularities across various vessels, the majority of which were 30-50% narrowed at the time.  DOE was believed to be out of proportion in relation to the patient's coronary disease, pulmonary consultation was recommended at that time EKG: Sinus bradycardia as above Denies chest pain, near-syncope, palpitations Given chronicity of DOE with some degree of worsening and no new or associated symptoms, do not feel as though ischemic workup is necessary today.  Would hesitate to perform cardiac catheterization given history of CKD stage IV and anemia of chronic disease. Will have patient continue to monitor for new S/S of ischemia and report to office if any develop, ED precautions given Continue aspirin  EC 81 mg daily, rosuvastatin  10 mg daily, isosorbide  mononitrate 30 mg daily, nitroglycerin  as needed.

## 2024-07-23 NOTE — Progress Notes (Unsigned)
 Applied a 14 day Zio XT monitor to patient in the office  Ryan Hunt to read

## 2024-07-23 NOTE — Patient Instructions (Addendum)
 Medication Instructions:  NO CHANGES  Lab Work: BMET, BNP, AND CBC TO BE DONE TODAY.  Testing/Procedures: GEOFFRY HEWS- Long Term Monitor Instructions  Your physician has requested you wear a ZIO patch monitor for 14 days.  This is a single patch monitor. [employer] supplies one patch monitor per enrollment. Additional stickers are not available. Please do not apply patch if you will be having a Nuclear Stress Test,  Echocardiogram, Cardiac CT, MRI, or Chest Xray during the period you would be wearing the  monitor. The patch cannot be worn during these tests. You cannot remove and re-apply the  ZIO XT patch monitor.  Your ZIO patch monitor will be mailed 3 day USPS to your address on file. It may take 3-5 days  to receive your monitor after you have been enrolled.  Once you have received your monitor, please review the enclosed instructions. Your monitor  has already been registered assigning a specific monitor serial # to you.  Billing and Patient Assistance Program Information  We have supplied [employer] with any of your insurance information on file for billing purposes. [employer] offers a sliding scale Patient Assistance Program for patients that do not have  insurance, or whose insurance does not completely cover the cost of the ZIO monitor.  You must apply for the Patient Assistance Program to qualify for this discounted rate.  To apply, please call [employer] at [phone], select option 4, select option 2, ask to apply for  Patient Assistance Program. Meredeth will ask your household income, and how many people  are in your household. They will quote your out-of-pocket cost based on that information.  [employer] will also be able to set up a 29-month, interest-free payment plan if needed.  Applying the monitor   Shave hair from upper left chest.  Hold abrader disc by orange tab. Rub abrader in 40 strokes over the upper left chest as  indicated in your monitor instructions.  Clean area with  4 enclosed alcohol pads. Let dry.  Apply patch as indicated in monitor instructions. Patch will be placed under collarbone on left  side of chest with arrow pointing upward.  Rub patch adhesive wings for 2 minutes. Remove white label marked 1. Remove the white  label marked 2. Rub patch adhesive wings for 2 additional minutes.  While looking in a mirror, press and release button in center of patch. A small green light will  flash 3-4 times. This will be your only indicator that the monitor has been turned on.  Do not shower for the first 24 hours. You may shower after the first 24 hours.  Press the button if you feel a symptom. You will hear a small click. Record Date, Time and  Symptom in the Patient Logbook.  When you are ready to remove the patch, follow instructions on the last 2 pages of Patient  Logbook. Stick patch monitor onto the last page of Patient Logbook.  Place Patient Logbook in the blue and white box. Use locking tab on box and tape box closed  securely. The blue and white box has prepaid postage on it. Please place it in the mailbox as  soon as possible. Your physician should have your test results approximately 7 days after the  monitor has been mailed back to [facility]  Call [facility] Customer Care at [phone] if you have questions regarding  your ZIO XT patch monitor. Call them immediately if you see an orange light blinking on your  monitor.  If  your monitor falls off in less than 4 days, contact our Monitor department at [phone].  If your monitor becomes loose or falls off after 4 days call [employer] at [phone] for  suggestions on securing your monitor   Follow-Up: At [facility], you and your health needs are our priority.  As part of our continuing mission to provide you with exceptional heart care, our providers are all part of one team.  This team includes your primary Cardiologist (physician) and Advanced Practice Providers  or APPs (Physician Assistants and Nurse Practitioners) who all work together to provide you with the care you need, when you need it.  Your next appointment:   1-2 MONTHS  Provider:   DR. KRISTE, DO   We recommend signing up for the patient portal called MyChart.  Sign up information is provided on this After Visit Summary.  MyChart is used to connect with patients for Virtual Visits (Telemedicine).  Patients are able to view lab/test results, encounter notes, upcoming appointments, etc.  Non-urgent messages can be sent to your provider as well.   To learn more about what you can do with MyChart, go to [email or web address].   Other Instructions:  A REFERRAL TO NEPHROLOGY HAS BEEN PUT IN AND SOMEONE FROM THEIR OFFICE WILL CONTACT YOU TO GET YOU SCHEDULED WITH THEM.

## 2024-07-23 NOTE — Progress Notes (Signed)
 " Cardiology Office Note:    Date:  07/23/2024   ID:  Ryan Hunt, DOB 23-Jan-1931, MRN [id number]  PCP:  Ryan Ingle, MD   [facility] Providers Cardiologist:  Ryan LELON [name] [name], MD (Inactive)     Referring MD: Ryan Ingle, MD   Chief complaint: Follow-up hospital admission     History of Present Illness:   Ryan Hunt is a 89 y.o. male with a hx of chronic diastolic heart failure, CKD stage IV, hyperkalemia, pancytopenia, HTN, T2DM, CAD with remote history of LCx stent, HLD presenting today for follow-up of recent hospitalization for symptomatic bradycardia.  Previously a patient of Dr. Theressa prior to his retirement.  LHC 2019: Luminal irregularities in the proximal-mid segment of dominant RCA.  Proximal-mid LAD 30-50% narrowing, first OM with ostial 80% narrowing distal to stent margin, ostium LCx with 40-50% narrowing.  Symptoms of dyspnea were stated out of proportion to degree of coronary disease.  There was doubt that the coronary disease was the cause of the patient's complaints of DOE, with recommendations to consider pulmonary consultation or other etiologies for dyspnea.  Patient was seen at his PCPs office with [facility] physicians in early November prior to his admission complaining of dizziness, BP meds were adjusted, echo ordered.  TTE 05/19/2024: Some flow acceleration through LVOT but no SAM or valvular stenosis.  Likely 2/2 basal septal hypertrophy.  LVEF 60-65%, normal function, no RWMA, moderate LVH, moderate dilatation of LA, mild tricuspid and mitral valve regurg, moderate thickening and sclerosis of aortic valve, no stenosis.  Presented to the ED 05/25/2024 with complaints of dizziness and lightheadedness with positional changes ongoing for days.  Holter monitor that was being performed outpatient reported significant drops in heart rate 10-11 times per day.  Hgb 10.8-> 9.4, platelets 78.  EP services consulted.  Patient appeared to have been  enrolled in a service called health snap, which is a continuous HR/BP monitoring system at home that reported heart rate in the 40s with stable/normotensive BPs.  Patient denied syncope or near syncope.  There appeared to be no indication for PPM during time of admission.  He did appear volume overloaded, Lasix  was given.  Patient was found to have hypermagnesemia with a range of 2.7-2.9 believed to be secondary to CKD, which could have contributed to bradycardia.  Patient was avoided to avoid OTC magnesium supplements.  Prior to hospitalization patient was only taking his Lasix  once daily, he was advised to take it twice daily as prescribed at discharge.  Patient presents with his son, Ryan Hunt, appears to be stable from a cardiovascular standpoint.  Patient has chronic shortness of breath, that he feels is slowly worsening over the last couple months.  He has not seen any other provider since his recent ED visit.  Denies chest pain, palpitations, lightheadedness, near-syncope, orthopnea, PND, dark/tarry/bloody stools, hematuria, weight changes.  Reports he does have some baseline bilateral ankle edema that improves with leg elevation.  States he owns compression stockings, does not like to wear them.  He has kept a very thorough documented history of his weight and vitals on a daily basis which I have scanned the most recent entries into our EMR today.  BPs average in the 130s/60s, with ranges from 89-167 systolic/45-75 diastolic, typically bradycardic in the 40's daily.  Able to perform activities of daily living up to light work around the house, unable to traverse stairs.  [place] and stopped smoking when  he was 19.  He states dizziness occurs primarily if he changes position too quickly, keeps this from happening by taking positional change slowly.    ROS:   Please see the history of present illness.    All other systems reviewed and are negative.     Past Medical History:   Diagnosis Date   Anemia    Anginal pain    Arthritis    knees   Bronchitis 11/2011   first time for me   CAD (coronary artery disease)    With DES circumflex 02/2009   CHF (congestive heart failure) (HCC)    Coronary atherosclerosis of native coronary artery    Diabetes mellitus with kidney disease (HCC)    Diastolic heart failure (HCC)    Diverticulosis of colon (without mention of hemorrhage)    Dyspnea    Exertional dyspnea    Chronic   GERD (gastroesophageal reflux disease)    History of kidney stones    History of peptic ulcer disease    Remote history   Hypercholesterolemia    Hypertension    Kidney stone    dr told me my kidney was loaded w/stones; imbedded   Labyrinthitis    Leg edema    Meralgia paresthetica    Mixed hyperlipidemia    Osteoarthrosis, unspecified whether generalized or localized, lower leg    Palpitations    Prostate cancer (HCC)    s/p prostatectomy   Type II diabetes mellitus (HCC)    dx 3 yrs ago    Past Surgical History:  Procedure Laterality Date   BACK SURGERY     BIOPSY  04/09/2019   Procedure: BIOPSY;  Surgeon: Ryan Agent, MD;  Location: [facility];  Service: Endoscopy;;   CARDIAC CATHETERIZATION     CATARACT EXTRACTION W/ INTRAOCULAR LENS IMPLANT & ANTERIOR VITRECTOMY, BILATERAL  1990's   COLONOSCOPY     COLONOSCOPY WITH PROPOFOL  N/A 04/09/2019   Procedure: COLONOSCOPY WITH PROPOFOL ;  Surgeon: Ryan Agent, MD;  Location: [facility];  Service: Endoscopy;  Laterality: N/A;   COLONOSCOPY WITH PROPOFOL  N/A 08/31/2019   Procedure: COLONOSCOPY WITH PROPOFOL ;  Surgeon: Ryan Claw, MD;  Location: [facility];  Service: Gastroenterology;  Laterality: N/A;   COLONOSCOPY WITH PROPOFOL  N/A 03/29/2020   Procedure: COLONOSCOPY WITH PROPOFOL ;  Surgeon: Ryan Claw, MD;  Location: [facility];  Service: Gastroenterology;  Laterality: N/A;   CORONARY ANGIOPLASTY WITH STENT PLACEMENT  02/2009   Stent x 1 (DES circumflex)    DIAGNOSTIC LAPAROSCOPY     ESOPHAGOGASTRODUODENOSCOPY (EGD) WITH PROPOFOL  N/A 04/09/2019   Procedure: ESOPHAGOGASTRODUODENOSCOPY (EGD) WITH PROPOFOL ;  Surgeon: Ryan Agent, MD;  Location: [facility];  Service: Endoscopy;  Laterality: N/A;   EYE SURGERY     bil lens implants   HEMOSTASIS CLIP PLACEMENT  08/31/2019   Procedure: HEMOSTASIS CLIP PLACEMENT;  Surgeon: Ryan Claw, MD;  Location: [facility];  Service: Gastroenterology;;  Ascending Polyp    HEMOSTASIS CONTROL  03/29/2020   Procedure: HEMOSTASIS CONTROL;  Surgeon: Ryan Claw, MD;  Location: [facility];  Service: Gastroenterology;;   JOINT REPLACEMENT     s/p left knee   LASIK Bilateral    LEFT HEART CATH AND CORONARY ANGIOGRAPHY N/A 02/27/2018   Procedure: LEFT HEART CATH AND CORONARY ANGIOGRAPHY;  Surgeon: [name] [facility] ORN, MD;  Location: [facility];  Service: Cardiovascular;  Laterality: N/A;   LUMBAR LAMINECTOMY/DECOMPRESSION MICRODISCECTOMY N/A 12/23/2014   Procedure:  L2-3 DECOMPRESSION;  Surgeon: Donaciano Sprang, MD;  Location: [facility];  Service: Orthopedics;  Laterality: N/A;   POLYPECTOMY  04/09/2019   Procedure: POLYPECTOMY;  Surgeon: Ryan Agent, MD;  Location: [facility];  Service: Endoscopy;;   POLYPECTOMY  08/31/2019   Procedure: POLYPECTOMY;  Surgeon: Ryan Claw, MD;  Location: [facility];  Service: Gastroenterology;;   POLYPECTOMY  03/29/2020   Procedure: POLYPECTOMY;  Surgeon: Ryan Claw, MD;  Location: [facility];  Service: Gastroenterology;;   PROSTATECTOMY  2009   Robotic prostatectomy    Skin Lumps Removed     TOTAL KNEE ARTHROPLASTY  12/31/2011   Procedure: TOTAL KNEE ARTHROPLASTY;  Surgeon: Lamar DELENA Millman, MD;  Location: [facility];  Service: Orthopedics;  Laterality: Left;  Left total knee arthroplasty   TOTAL SHOULDER REPLACEMENT  1990's   bilaterally   TRIGGER FINGER RELEASE Left 02/14/2021   Procedure: RELEASE TRIGGER FINGER/A-1 PULLEY LEFT RING FINGER;  Surgeon:  Murrell Kuba, MD;  Location: [facility];  Service: Orthopedics;  Laterality: Left;  45 MIN    Current Medications: Active Medications[1]   Allergies:   Amoxicillin, Oxycodone  hcl, and Simvastatin   Social History   Socioeconomic History   Marital status: Widowed    Spouse name: Not on file   Number of children: 1   Years of education: Not on file   Highest education level: Not on file  Occupational History   Occupation: Retired  Tobacco Use   Smoking status: Former    Current packs/day: 0.00    Average packs/day: 0.5 packs/day for 15.0 years (7.5 ttl pk-yrs)    Types: Cigarettes    Start date: 07/09/1956    Quit date: 07/10/1971    Years since quitting: 53.0   Smokeless tobacco: Never  Vaping Use   Vaping status: Never Used  Substance and Sexual Activity   Alcohol use: Yes    Comment: 01/01/12 occassionall have a glass of beer or wine; not even once a week   Drug use: No   Sexual activity: Not Currently  Other Topics Concern   Not on file  Social History Narrative   Not on file   Social Drivers of Health   Tobacco Use: Medium Risk (07/23/2024)   Patient History    Smoking Tobacco Use: Former    Smokeless Tobacco Use: Never    Passive Exposure: Not on Actuary Strain: Not on file  Food Insecurity: No Food Insecurity (05/26/2024)   Epic    Worried About Programme Researcher, Broadcasting/film/video in the Last Year: Never true    Ran Out of Food in the Last Year: Never true  Transportation Needs: No Transportation Needs (05/26/2024)   Epic    Lack of Transportation (Medical): No    Lack of Transportation (Non-Medical): No  Physical Activity: Not on file  Stress: Not on file  Social Connections: Unknown (05/26/2024)   Social Connection and Isolation Panel    Frequency of Communication with Friends and Family: More than three times a week    Frequency of Social Gatherings with Friends and Family: Not on file    Attends Religious Services: Not on file     Active Member of Clubs or Organizations: Not on file    Attends Banker Meetings: Not on file    Marital Status: Not on file  Depression (PHQ2-9): Not on file  Alcohol Screen: Not on file  Housing: Low Risk (05/26/2024)   Epic    Unable to Pay for Housing in the Last Year: No    Number of Times Moved in the Last  Year: 0    Homeless in the Last Year: No  Utilities: Not At Risk (05/26/2024)   Epic    Threatened with loss of utilities: No  Health Literacy: Not on file     Family History: The patient's family history includes CAD in his father; COPD in his brother; CVA in his father and mother; Dementia in his mother; Heart disease in his father; Nephritis in his brother; Prostate cancer in his father; Stroke in his father; Tremor in his mother. There is no history of Heart attack.  EKGs/Labs/Other Studies Reviewed:    The following studies were reviewed today:  EKG Interpretation Date/Time:  Thursday July 23 2024 13:21:54 EST Ventricular Rate:  53 PR Interval:  272 QRS Duration:  148 QT Interval:  488 QTC Calculation: 457 R Axis:   -18  Text Interpretation: Sinus bradycardia with 1st degree A-V block with Blocked Premature atrial complexes with occasional Premature ventricular complexes Left bundle branch block No significant change from prior studies Confirmed by Martinez Boxx [phone]) on 07/23/2024 1:34:19 PM    Recent Labs: 05/26/2024: ALT 18; B Natriuretic Peptide 133.8; TSH 1.792 05/28/2024: BUN 63; Creatinine, Ser 2.66; Hemoglobin 9.9; Magnesium 2.9; Platelets 82; Potassium 4.8; Sodium 138  Recent Lipid Panel    Component Value Date/Time   CHOL 161 07/17/2023 1205   TRIG 135 07/17/2023 1205   HDL 41 07/17/2023 1205   CHOLHDL 3.9 07/17/2023 1205   CHOLHDL 3.2 02/27/2018 0702   VLDL 15 02/27/2018 0702   LDLCALC 96 07/17/2023 1205     Risk Assessment/Calculations:                Physical Exam:    VS:  BP (!) 120/50 (BP Location: Right Arm,  Patient Position: Sitting, Cuff Size: Large)   Pulse (!) 53   Ht 5' 8 (1.727 m)   Wt 216 lb (98 kg)   BMI 32.84 kg/m        Wt Readings from Last 3 Encounters:  07/23/24 216 lb (98 kg)  05/28/24 210 lb 8.6 oz (95.5 kg)  07/17/23 221 lb 9.6 oz (100.5 kg)     GEN:  Well nourished, well developed in no acute distress HEENT: Normal NECK:  No carotid bruits CARDIAC:  S1-S2 normal, irregularly irregular, 2/6 systolic murmur RUSB, rubs, gallops RESPIRATORY:  Clear to auscultation without rales, wheezing or rhonchi  MUSCULOSKELETAL: 1+ bilateral pitting ankle edema; No deformity  SKIN: Warm and dry NEUROLOGIC:  Alert and oriented x 3 PSYCHIATRIC:  Normal affect       Assessment & Plan DOE (dyspnea on exertion) Patient reports a chronic DOE, ongoing for years, that has been worsening over the last couple months since his ED visit.  Dizziness from the original ED visit has somewhat improved with changes to blood pressure medications from his PCPs office. Echo performed by PCP on 05/19/2024: No SAM or valvular stenosis, LVEF 60-65%, moderate basal septal hypertrophy. I believe this may be multifactorial, given history of bradycardia with rates averaging in the 40s, most recent hemoglobin of 9.9, recent ED visit appearing mildly volume overloaded requiring diuresis. Will order a BNP to rule out volume overload CBC to evaluate anemia BMP to evaluate kidney function/electrolytes Anemia due to stage 4 chronic kidney disease (HCC) 05/25/2024: Hgb 10.8-> 9.4, platelets 78 05/28/2024: Hemoglobin 9.9 Recheck CBC as above Bradycardia LBBB (left bundle branch block) PVC's (premature ventricular contractions) Per patient's home vitals report that were scanned into the chart today, patient averages heart rates in the  40s throughout the day EP consulted during his hospital admission, not believed to need a pacemaker at that time EKG today: 53 bpm, sinus bradycardia with first-degree AV block  with blocked PACs and occasional PVCs, LBBB, no significant change from prior studies. LBBB observed in EKGs as far back as 2018, possibly earlier.  PVCs appear to be occurring more frequently and today's EKG than prior EKGs Will order 2-week ZIO monitor to assess the extent of bradycardia and ectopy burden Coronary artery disease involving native coronary artery of native heart without angina pectoris LHC 2019 with luminal irregularities across various vessels, the majority of which were 30-50% narrowed at the time.  DOE was believed to be out of proportion in relation to the patient's coronary disease, pulmonary consultation was recommended at that time EKG: Sinus bradycardia as above Denies chest pain, near-syncope, palpitations Given chronicity of DOE with some degree of worsening and no new or associated symptoms, do not feel as though ischemic workup is necessary today.  Would hesitate to perform cardiac catheterization given history of CKD stage IV and anemia of chronic disease. Will have patient continue to monitor for new S/S of ischemia and report to office if any develop, ED precautions given Continue aspirin  EC 81 mg daily, rosuvastatin  10 mg daily, isosorbide  mononitrate 30 mg daily, nitroglycerin  as needed. Murmur 2/6, systolic, RUSB Patient reports this is normal for him No significant valvular disease on recent echo CKD (chronic kidney disease) stage 4, GFR 15-29 ml/min (HCC) 05/28/2024: BUN 63, creatinine 2.66, GFR 22 Placing ambulatory referral to nephrology to establish care and assist with balancing volume with kidney function  Disposition: Follow-up in 1-2 months with your Cardiologist.  Proceed to the ED with any new or worsening symptoms.             Medication Adjustments/Labs and Tests Ordered: Current medicines are reviewed at length with the patient today.  Concerns regarding medicines are outlined above.  Orders Placed This Encounter  Procedures   Brain  natriuretic peptide   Basic metabolic panel with GFR   CBC   Ambulatory referral to Nephrology   LONG TERM MONITOR (3-14 DAYS)   EKG 12-Lead   No orders of the defined types were placed in this encounter.   Patient Instructions  Medication Instructions:  NO CHANGES  Lab Work: BMET, BNP, AND CBC TO BE DONE TODAY.  Testing/Procedures: GEOFFRY HEWS- Long Term Monitor Instructions  Your physician has requested you wear a ZIO patch monitor for 14 days.  This is a single patch monitor. [employer] supplies one patch monitor per enrollment. Additional stickers are not available. Please do not apply patch if you will be having a Nuclear Stress Test,  Echocardiogram, Cardiac CT, MRI, or Chest Xray during the period you would be wearing the  monitor. The patch cannot be worn during these tests. You cannot remove and re-apply the  ZIO XT patch monitor.  Your ZIO patch monitor will be mailed 3 day USPS to your address on file. It may take 3-5 days  to receive your monitor after you have been enrolled.  Once you have received your monitor, please review the enclosed instructions. Your monitor  has already been registered assigning a specific monitor serial # to you.  Billing and Patient Assistance Program Information  We have supplied [employer] with any of your insurance information on file for billing purposes. [employer] offers a sliding scale Patient Assistance Program for patients that do not have  insurance, or whose insurance does  not completely cover the cost of the ZIO monitor.  You must apply for the Patient Assistance Program to qualify for this discounted rate.  To apply, please call [employer] at [phone], select option 4, select option 2, ask to apply for  Patient Assistance Program. Meredeth will ask your household income, and how many people  are in your household. They will quote your out-of-pocket cost based on that information.  [employer] will also be able to set up a 46-month,  interest-free payment plan if needed.  Applying the monitor   Shave hair from upper left chest.  Hold abrader disc by orange tab. Rub abrader in 40 strokes over the upper left chest as  indicated in your monitor instructions.  Clean area with 4 enclosed alcohol pads. Let dry.  Apply patch as indicated in monitor instructions. Patch will be placed under collarbone on left  side of chest with arrow pointing upward.  Rub patch adhesive wings for 2 minutes. Remove white label marked 1. Remove the white  label marked 2. Rub patch adhesive wings for 2 additional minutes.  While looking in a mirror, press and release button in [facility] of patch. A small green light will  flash 3-4 times. This will be your only indicator that the monitor has been turned on.  Do not shower for the first 24 hours. You may shower after the first 24 hours.  Press the button if you feel a symptom. You will hear a small click. Record Date, Time and  Symptom in the Patient Logbook.  When you are ready to remove the patch, follow instructions on the last 2 pages of Patient  Logbook. Stick patch monitor onto the last page of Patient Logbook.  Place Patient Logbook in the blue and white box. Use locking tab on box and tape box closed  securely. The blue and white box has prepaid postage on it. Please place it in the mailbox as  soon as possible. Your physician should have your test results approximately 7 days after the  monitor has been mailed back to [facility].  Call [facility] Customer Care at [phone] if you have questions regarding  your ZIO XT patch monitor. Call them immediately if you see an orange light blinking on your  monitor.  If your monitor falls off in less than 4 days, contact our Monitor department at [phone].  If your monitor becomes loose or falls off after 4 days call [employer] at [phone] for  suggestions on securing your monitor   Follow-Up: At [facility], you  and your health needs are our priority.  As part of our continuing mission to provide you with exceptional heart care, our providers are all part of one team.  This team includes your primary Cardiologist (physician) and Advanced Practice Providers or APPs (Physician Assistants and Nurse Practitioners) who all work together to provide you with the care you need, when you need it.  Your next appointment:   1-2 MONTHS  Provider:   DR. KRISTE, DO   We recommend signing up for the patient portal called MyChart.  Sign up information is provided on this After Visit Summary.  MyChart is used to connect with patients for Virtual Visits (Telemedicine).  Patients are able to view lab/test results, encounter notes, upcoming appointments, etc.  Non-urgent messages can be sent to your provider as well.   To learn more about what you can do with MyChart, go to [email or web address].   Other Instructions:  A REFERRAL TO NEPHROLOGY  HAS BEEN PUT IN AND SOMEONE FROM THEIR OFFICE WILL CONTACT YOU TO GET YOU SCHEDULED WITH THEM.      Signed, Miriam FORBES Shams, NP  07/23/2024 2:38 PM    [facility]     [1]  Current Meds  Medication Sig   aspirin  EC 81 MG tablet Take 1 tablet (81 mg total) by mouth daily. Swallow whole. (Patient taking differently: Take 81 mg by mouth at bedtime. Swallow whole.)   Continuous Blood Gluc Sensor (FREESTYLE LIBRE 14 DAY SENSOR) MISC Inject 1 Device into the skin every 14 (fourteen) days.   furosemide  (LASIX ) 40 MG tablet Take 1 tablet (40 mg total) by mouth 2 (two) times daily.   insulin  glargine (LANTUS ) 100 UNIT/ML Solostar Pen Inject 43 Units into the skin daily.   isosorbide  mononitrate (IMDUR ) 30 MG 24 hr tablet Take 30 mg by mouth daily.   nitroGLYCERIN  (NITROSTAT ) 0.4 MG SL tablet Place 0.4 mg under the tongue every 5 (five) minutes as needed for chest pain.    rosuvastatin  (CRESTOR ) 10 MG tablet Take 10 mg by mouth daily.   telmisartan  (MICARDIS ) 80 MG  tablet Take 1 tablet (80 mg total) by mouth daily.   traMADol  (ULTRAM ) 50 MG tablet Take 50 mg by mouth 2 (two) times daily as needed for moderate pain (arthritis back pain.).    "

## 2024-07-24 LAB — BASIC METABOLIC PANEL WITH GFR
BUN/Creatinine Ratio: 22 (ref 10–24)
BUN: 56 mg/dL — AB (ref 10–36)
CO2: 21 mmol/L (ref 20–29)
Calcium: 10.1 mg/dL (ref 8.6–10.2)
Chloride: 102 mmol/L (ref 96–106)
Creatinine, Ser: 2.5 mg/dL — AB (ref 0.76–1.27)
Glucose: 94 mg/dL (ref 70–99)
Potassium: 5 mmol/L (ref 3.5–5.2)
Sodium: 138 mmol/L (ref 134–144)
eGFR: 24 mL/min/1.73 — AB

## 2024-07-24 LAB — CBC
Hematocrit: 37.6 % (ref 37.5–51.0)
Hemoglobin: 12 g/dL — ABNORMAL LOW (ref 13.0–17.7)
MCH: 29.2 pg (ref 26.6–33.0)
MCHC: 31.9 g/dL (ref 31.5–35.7)
MCV: 92 fL (ref 79–97)
Platelets: 106 x10E3/uL — ABNORMAL LOW (ref 150–450)
RBC: 4.11 x10E6/uL — ABNORMAL LOW (ref 4.14–5.80)
RDW: 14.1 % (ref 11.6–15.4)
WBC: 5.7 x10E3/uL (ref 3.4–10.8)

## 2024-07-24 LAB — BRAIN NATRIURETIC PEPTIDE: BNP: 108.7 pg/mL — AB (ref 0.0–100.0)

## 2024-07-28 ENCOUNTER — Ambulatory Visit: Payer: Self-pay | Admitting: Emergency Medicine

## 2024-07-28 NOTE — Progress Notes (Signed)
 Spoke with son of patient regarding the results. Also gave son info to call [facility] for father to follow up per referral. Referral placed 07/23/24.

## 2024-07-28 NOTE — Progress Notes (Signed)
 No answer when calling pt number

## 2024-08-13 DIAGNOSIS — I493 Ventricular premature depolarization: Secondary | ICD-10-CM

## 2024-08-27 ENCOUNTER — Ambulatory Visit: Admitting: Internal Medicine
